# Patient Record
Sex: Male | Born: 1940 | Race: White | Hispanic: No | Smoking: Former smoker
Health system: Southern US, Community
[De-identification: ages and names within clinical notes are randomized; demographics above are authoritative.]

## PROBLEM LIST (undated history)

## (undated) ENCOUNTER — Ambulatory Visit: Source: Home / Self Care

## (undated) DIAGNOSIS — D126 Benign neoplasm of colon, unspecified: Secondary | ICD-10-CM

## (undated) DIAGNOSIS — K5790 Diverticulosis of intestine, part unspecified, without perforation or abscess without bleeding: Secondary | ICD-10-CM

## (undated) DIAGNOSIS — K4090 Unilateral inguinal hernia, without obstruction or gangrene, not specified as recurrent: Secondary | ICD-10-CM

## (undated) DIAGNOSIS — E785 Hyperlipidemia, unspecified: Secondary | ICD-10-CM

## (undated) DIAGNOSIS — J61 Pneumoconiosis due to asbestos and other mineral fibers: Secondary | ICD-10-CM

## (undated) DIAGNOSIS — I441 Atrioventricular block, second degree: Secondary | ICD-10-CM

## (undated) DIAGNOSIS — L602 Onychogryphosis: Secondary | ICD-10-CM

## (undated) DIAGNOSIS — C4491 Basal cell carcinoma of skin, unspecified: Secondary | ICD-10-CM

## (undated) DIAGNOSIS — H409 Unspecified glaucoma: Secondary | ICD-10-CM

## (undated) DIAGNOSIS — I491 Atrial premature depolarization: Secondary | ICD-10-CM

## (undated) DIAGNOSIS — I10 Essential (primary) hypertension: Secondary | ICD-10-CM

## (undated) DIAGNOSIS — H269 Unspecified cataract: Secondary | ICD-10-CM

## (undated) DIAGNOSIS — K648 Other hemorrhoids: Secondary | ICD-10-CM

## (undated) HISTORY — DX: Atrial premature depolarization: I49.1

## (undated) HISTORY — DX: Unspecified glaucoma: H40.9

## (undated) HISTORY — DX: Pneumoconiosis due to asbestos and other mineral fibers: J61

## (undated) HISTORY — DX: Unilateral inguinal hernia, without obstruction or gangrene, not specified as recurrent: K40.90

## (undated) HISTORY — DX: Essential (primary) hypertension: I10

## (undated) HISTORY — PX: PILONIDAL CYST EXCISION: SHX744

## (undated) HISTORY — DX: Basal cell carcinoma of skin, unspecified: C44.91

## (undated) HISTORY — DX: Other hemorrhoids: K64.8

## (undated) HISTORY — DX: Diverticulosis of intestine, part unspecified, without perforation or abscess without bleeding: K57.90

## (undated) HISTORY — DX: Hyperlipidemia, unspecified: E78.5

## (undated) HISTORY — PX: REFRACTIVE SURGERY: SHX103

## (undated) HISTORY — DX: Unspecified cataract: H26.9

## (undated) HISTORY — DX: Onychogryphosis: L60.2

## (undated) HISTORY — DX: Benign neoplasm of colon, unspecified: D12.6

## (undated) HISTORY — PX: SHOULDER SURGERY: SHX246

## (undated) HISTORY — PX: TONSILLECTOMY: SUR1361

## (undated) HISTORY — PX: EYE SURGERY: SHX253

---

## 1999-05-21 ENCOUNTER — Encounter: Admission: RE | Admit: 1999-05-21 | Discharge: 1999-08-19 | Payer: Self-pay | Admitting: Family Medicine

## 2000-05-24 LAB — HM COLONOSCOPY: HM Colonoscopy: NORMAL

## 2000-12-01 ENCOUNTER — Ambulatory Visit (HOSPITAL_COMMUNITY): Admission: RE | Admit: 2000-12-01 | Discharge: 2000-12-01 | Payer: Self-pay | Admitting: *Deleted

## 2000-12-01 ENCOUNTER — Encounter: Payer: Self-pay | Admitting: Internal Medicine

## 2003-07-30 ENCOUNTER — Ambulatory Visit (HOSPITAL_COMMUNITY): Admission: RE | Admit: 2003-07-30 | Discharge: 2003-07-30 | Payer: Self-pay | Admitting: Orthopaedic Surgery

## 2003-07-30 ENCOUNTER — Ambulatory Visit (HOSPITAL_BASED_OUTPATIENT_CLINIC_OR_DEPARTMENT_OTHER): Admission: RE | Admit: 2003-07-30 | Discharge: 2003-07-30 | Payer: Self-pay | Admitting: Orthopaedic Surgery

## 2005-01-26 ENCOUNTER — Emergency Department (HOSPITAL_COMMUNITY): Admission: EM | Admit: 2005-01-26 | Discharge: 2005-01-27 | Payer: Self-pay | Admitting: Emergency Medicine

## 2005-08-22 ENCOUNTER — Emergency Department (HOSPITAL_COMMUNITY): Admission: EM | Admit: 2005-08-22 | Discharge: 2005-08-22 | Payer: Self-pay | Admitting: Emergency Medicine

## 2005-09-07 ENCOUNTER — Encounter: Admission: RE | Admit: 2005-09-07 | Discharge: 2005-09-07 | Payer: Self-pay | Admitting: Internal Medicine

## 2005-12-20 ENCOUNTER — Ambulatory Visit: Payer: Self-pay | Admitting: Family Medicine

## 2006-03-28 ENCOUNTER — Ambulatory Visit: Payer: Self-pay | Admitting: Family Medicine

## 2006-03-28 LAB — CONVERTED CEMR LAB
HDL: 28.8 mg/dL — ABNORMAL LOW (ref >39.0)
Hgb A1c MFr Bld: 6.1 % — ABNORMAL HIGH (ref 4.6–6.0)
LDL Cholesterol: 104 mg/dL — ABNORMAL HIGH (ref 0–99)
VLDL: 26 mg/dL (ref 0–40)

## 2006-05-31 ENCOUNTER — Ambulatory Visit: Payer: Self-pay | Admitting: Family Medicine

## 2006-05-31 LAB — CONVERTED CEMR LAB
ALT: 46 units/L — ABNORMAL HIGH (ref 0–40)
AST: 37 units/L (ref 0–37)
Chol/HDL Ratio, serum: 3.8
HDL: 35.6 mg/dL — ABNORMAL LOW (ref >39.0)
LDL Cholesterol: 85 mg/dL (ref 0–99)
Triglyceride fasting, serum: 74 mg/dL (ref 0–149)
VLDL: 15 mg/dL (ref 0–40)

## 2006-06-28 ENCOUNTER — Ambulatory Visit: Payer: Self-pay | Admitting: Family Medicine

## 2006-06-28 LAB — CONVERTED CEMR LAB
AST: 35 units/L (ref 0–37)
BUN: 12 mg/dL (ref 6–23)
CO2: 29 meq/L (ref 19–32)
Calcium: 9.5 mg/dL (ref 8.4–10.5)
Chloride: 104 meq/L (ref 96–112)
Cholesterol: 141 mg/dL (ref 0–200)
Creatinine, Ser: 1 mg/dL (ref 0.4–1.5)
GFR calc Af Amer: 96 mL/min
Glucose, Bld: 137 mg/dL — ABNORMAL HIGH (ref 70–99)
Hgb A1c MFr Bld: 6.5 % — ABNORMAL HIGH (ref 4.6–6.0)
Sodium: 137 meq/L (ref 135–145)

## 2006-07-19 ENCOUNTER — Ambulatory Visit: Payer: Self-pay | Admitting: Family Medicine

## 2006-07-19 LAB — CONVERTED CEMR LAB
CO2: 30 meq/L (ref 19–32)
Calcium: 9.2 mg/dL (ref 8.4–10.5)
Creatinine, Ser: 0.9 mg/dL (ref 0.4–1.5)
GFR calc Af Amer: 108 mL/min
Glucose, Bld: 119 mg/dL — ABNORMAL HIGH (ref 70–99)
Potassium: 4.6 meq/L (ref 3.5–5.1)
Sodium: 139 meq/L (ref 135–145)

## 2006-09-23 DIAGNOSIS — E785 Hyperlipidemia, unspecified: Secondary | ICD-10-CM | POA: Insufficient documentation

## 2006-09-23 DIAGNOSIS — I10 Essential (primary) hypertension: Secondary | ICD-10-CM

## 2006-09-23 DIAGNOSIS — E1140 Type 2 diabetes mellitus with diabetic neuropathy, unspecified: Secondary | ICD-10-CM

## 2006-09-23 DIAGNOSIS — C4490 Unspecified malignant neoplasm of skin, unspecified: Secondary | ICD-10-CM

## 2006-09-27 ENCOUNTER — Encounter: Payer: Self-pay | Admitting: Family Medicine

## 2006-09-27 ENCOUNTER — Ambulatory Visit: Payer: Self-pay | Admitting: Family Medicine

## 2006-09-27 ENCOUNTER — Encounter (INDEPENDENT_AMBULATORY_CARE_PROVIDER_SITE_OTHER): Payer: Self-pay | Admitting: Family Medicine

## 2006-09-28 ENCOUNTER — Telehealth (INDEPENDENT_AMBULATORY_CARE_PROVIDER_SITE_OTHER): Payer: Self-pay | Admitting: Family Medicine

## 2006-09-28 LAB — CONVERTED CEMR LAB
Chloride: 103 meq/L (ref 96–112)
Creatinine, Ser: 0.9 mg/dL (ref 0.4–1.5)
GFR calc non Af Amer: 89 mL/min
Hgb A1c MFr Bld: 6.1 % — ABNORMAL HIGH (ref 4.6–6.0)
Potassium: 4.1 meq/L (ref 3.5–5.1)

## 2007-01-03 ENCOUNTER — Telehealth (INDEPENDENT_AMBULATORY_CARE_PROVIDER_SITE_OTHER): Payer: Self-pay | Admitting: *Deleted

## 2007-01-31 ENCOUNTER — Telehealth (INDEPENDENT_AMBULATORY_CARE_PROVIDER_SITE_OTHER): Payer: Self-pay | Admitting: *Deleted

## 2007-02-06 ENCOUNTER — Telehealth (INDEPENDENT_AMBULATORY_CARE_PROVIDER_SITE_OTHER): Payer: Self-pay | Admitting: *Deleted

## 2007-03-01 ENCOUNTER — Ambulatory Visit: Payer: Self-pay | Admitting: Family Medicine

## 2007-03-02 ENCOUNTER — Encounter (INDEPENDENT_AMBULATORY_CARE_PROVIDER_SITE_OTHER): Payer: Self-pay | Admitting: *Deleted

## 2007-03-02 ENCOUNTER — Telehealth (INDEPENDENT_AMBULATORY_CARE_PROVIDER_SITE_OTHER): Payer: Self-pay | Admitting: *Deleted

## 2007-03-02 LAB — CONVERTED CEMR LAB
AST: 27 units/L (ref 0–37)
Chloride: 104 meq/L (ref 96–112)
Cholesterol: 142 mg/dL (ref 0–200)
Creatinine,U: 50.8 mg/dL
GFR calc Af Amer: 108 mL/min
Glucose, Bld: 112 mg/dL — ABNORMAL HIGH (ref 70–99)
HDL: 36.1 mg/dL — ABNORMAL LOW (ref >39.0)
Microalb, Ur: 0.2 mg/dL (ref 0.0–1.9)
Potassium: 4.4 meq/L (ref 3.5–5.1)
Sodium: 140 meq/L (ref 135–145)
Total CHOL/HDL Ratio: 3.9
VLDL: 23 mg/dL (ref 0–40)

## 2007-03-07 ENCOUNTER — Encounter (INDEPENDENT_AMBULATORY_CARE_PROVIDER_SITE_OTHER): Payer: Self-pay | Admitting: Family Medicine

## 2007-04-05 ENCOUNTER — Ambulatory Visit: Payer: Self-pay | Admitting: Family Medicine

## 2007-06-27 ENCOUNTER — Ambulatory Visit: Payer: Self-pay | Admitting: Family Medicine

## 2007-06-28 ENCOUNTER — Telehealth (INDEPENDENT_AMBULATORY_CARE_PROVIDER_SITE_OTHER): Payer: Self-pay | Admitting: *Deleted

## 2007-07-03 ENCOUNTER — Encounter (INDEPENDENT_AMBULATORY_CARE_PROVIDER_SITE_OTHER): Payer: Self-pay | Admitting: *Deleted

## 2007-07-03 LAB — CONVERTED CEMR LAB
AST: 25 units/L (ref 0–37)
Hgb A1c MFr Bld: 6.3 % — ABNORMAL HIGH (ref 4.6–6.0)

## 2007-09-28 ENCOUNTER — Ambulatory Visit: Payer: Self-pay | Admitting: Internal Medicine

## 2007-10-03 LAB — CONVERTED CEMR LAB
BUN: 14 mg/dL (ref 6–23)
Calcium: 9.4 mg/dL (ref 8.4–10.5)
Creatinine, Ser: 0.9 mg/dL (ref 0.4–1.5)
Eosinophils Relative: 4.7 % (ref 0.0–5.0)
GFR calc Af Amer: 108 mL/min
GFR calc non Af Amer: 89 mL/min
Hemoglobin: 14.3 g/dL (ref 13.0–17.0)
Monocytes Absolute: 0.5 10*3/uL (ref 0.1–1.0)
Monocytes Relative: 9 % (ref 3.0–12.0)
Neutro Abs: 3.5 10*3/uL (ref 1.4–7.7)
Platelets: 259 10*3/uL (ref 150–400)
WBC: 5.9 10*3/uL (ref 4.5–10.5)

## 2008-02-13 ENCOUNTER — Ambulatory Visit: Payer: Self-pay | Admitting: Internal Medicine

## 2008-02-19 LAB — CONVERTED CEMR LAB
ALT: 24 units/L (ref 0–53)
AST: 24 units/L (ref 0–37)

## 2008-02-20 ENCOUNTER — Telehealth (INDEPENDENT_AMBULATORY_CARE_PROVIDER_SITE_OTHER): Payer: Self-pay | Admitting: *Deleted

## 2008-02-29 ENCOUNTER — Telehealth (INDEPENDENT_AMBULATORY_CARE_PROVIDER_SITE_OTHER): Payer: Self-pay | Admitting: *Deleted

## 2008-03-14 ENCOUNTER — Encounter: Payer: Self-pay | Admitting: Internal Medicine

## 2008-03-21 ENCOUNTER — Encounter: Payer: Self-pay | Admitting: Internal Medicine

## 2008-05-25 ENCOUNTER — Encounter: Payer: Self-pay | Admitting: Internal Medicine

## 2008-05-29 ENCOUNTER — Encounter: Payer: Self-pay | Admitting: Internal Medicine

## 2008-06-03 ENCOUNTER — Telehealth: Payer: Self-pay | Admitting: Internal Medicine

## 2008-06-10 ENCOUNTER — Encounter: Admission: RE | Admit: 2008-06-10 | Discharge: 2008-06-10 | Payer: Self-pay | Admitting: Internal Medicine

## 2008-06-11 ENCOUNTER — Ambulatory Visit: Payer: Self-pay | Admitting: Internal Medicine

## 2008-06-11 ENCOUNTER — Encounter (INDEPENDENT_AMBULATORY_CARE_PROVIDER_SITE_OTHER): Payer: Self-pay | Admitting: *Deleted

## 2008-06-12 ENCOUNTER — Telehealth (INDEPENDENT_AMBULATORY_CARE_PROVIDER_SITE_OTHER): Payer: Self-pay | Admitting: *Deleted

## 2008-06-13 ENCOUNTER — Encounter (INDEPENDENT_AMBULATORY_CARE_PROVIDER_SITE_OTHER): Payer: Self-pay | Admitting: *Deleted

## 2008-06-18 ENCOUNTER — Ambulatory Visit: Payer: Self-pay | Admitting: Internal Medicine

## 2008-06-24 ENCOUNTER — Encounter (INDEPENDENT_AMBULATORY_CARE_PROVIDER_SITE_OTHER): Payer: Self-pay | Admitting: *Deleted

## 2008-07-26 ENCOUNTER — Telehealth (INDEPENDENT_AMBULATORY_CARE_PROVIDER_SITE_OTHER): Payer: Self-pay | Admitting: *Deleted

## 2008-10-10 ENCOUNTER — Encounter: Payer: Self-pay | Admitting: Internal Medicine

## 2008-10-15 ENCOUNTER — Ambulatory Visit: Payer: Self-pay | Admitting: Internal Medicine

## 2008-10-24 LAB — CONVERTED CEMR LAB
AST: 37 units/L (ref 0–37)
Basophils Relative: 0.4 % (ref 0.0–3.0)
Eosinophils Absolute: 0.5 10*3/uL (ref 0.0–0.7)
Eosinophils Relative: 7 % — ABNORMAL HIGH (ref 0.0–5.0)
HCT: 39.6 % (ref 39.0–52.0)
MCV: 93.8 fL (ref 78.0–100.0)
Monocytes Relative: 7.6 % (ref 3.0–12.0)
WBC: 6.5 10*3/uL (ref 4.5–10.5)

## 2009-01-01 ENCOUNTER — Telehealth (INDEPENDENT_AMBULATORY_CARE_PROVIDER_SITE_OTHER): Payer: Self-pay | Admitting: *Deleted

## 2009-02-18 ENCOUNTER — Ambulatory Visit: Payer: Self-pay | Admitting: Internal Medicine

## 2009-02-20 ENCOUNTER — Telehealth (INDEPENDENT_AMBULATORY_CARE_PROVIDER_SITE_OTHER): Payer: Self-pay | Admitting: *Deleted

## 2009-02-21 ENCOUNTER — Encounter (INDEPENDENT_AMBULATORY_CARE_PROVIDER_SITE_OTHER): Payer: Self-pay | Admitting: *Deleted

## 2009-02-21 LAB — CONVERTED CEMR LAB
BUN: 12 mg/dL (ref 6–23)
CO2: 28 meq/L (ref 19–32)
Calcium: 9.1 mg/dL (ref 8.4–10.5)
Creatinine,U: 129.6 mg/dL
Glucose, Bld: 126 mg/dL — ABNORMAL HIGH (ref 70–99)
Hgb A1c MFr Bld: 6.6 % — ABNORMAL HIGH (ref 4.6–6.5)
LDL Cholesterol: 80 mg/dL (ref 0–99)
Microalb Creat Ratio: 2.3 mg/g (ref 0.0–30.0)
Triglycerides: 118 mg/dL (ref 0.0–149.0)
VLDL: 23.6 mg/dL (ref 0.0–40.0)

## 2009-04-22 ENCOUNTER — Telehealth (INDEPENDENT_AMBULATORY_CARE_PROVIDER_SITE_OTHER): Payer: Self-pay | Admitting: *Deleted

## 2009-05-09 ENCOUNTER — Telehealth (INDEPENDENT_AMBULATORY_CARE_PROVIDER_SITE_OTHER): Payer: Self-pay | Admitting: *Deleted

## 2009-05-26 ENCOUNTER — Telehealth (INDEPENDENT_AMBULATORY_CARE_PROVIDER_SITE_OTHER): Payer: Self-pay | Admitting: *Deleted

## 2009-06-25 ENCOUNTER — Ambulatory Visit: Payer: Self-pay | Admitting: Internal Medicine

## 2009-06-30 LAB — CONVERTED CEMR LAB: Hgb A1c MFr Bld: 7.3 % — ABNORMAL HIGH (ref 4.6–6.5)

## 2009-07-08 ENCOUNTER — Encounter (INDEPENDENT_AMBULATORY_CARE_PROVIDER_SITE_OTHER): Payer: Self-pay | Admitting: *Deleted

## 2009-07-10 ENCOUNTER — Telehealth: Payer: Self-pay | Admitting: Internal Medicine

## 2009-07-10 ENCOUNTER — Ambulatory Visit: Payer: Self-pay | Admitting: Diagnostic Radiology

## 2009-07-10 ENCOUNTER — Encounter: Payer: Self-pay | Admitting: Internal Medicine

## 2009-07-10 ENCOUNTER — Emergency Department (HOSPITAL_BASED_OUTPATIENT_CLINIC_OR_DEPARTMENT_OTHER): Admission: EM | Admit: 2009-07-10 | Discharge: 2009-07-10 | Payer: Self-pay | Admitting: Emergency Medicine

## 2009-07-10 LAB — CONVERTED CEMR LAB
Creatinine, Ser: 0.9 mg/dL
Glucose, Bld: 155 mg/dL
Hemoglobin: 14.1 g/dL
Potassium, serum: 4.3 mmol/L
RBC count: 4.32 10*6/uL
WBC, blood: 4.3 10*3/uL

## 2009-09-08 ENCOUNTER — Encounter (INDEPENDENT_AMBULATORY_CARE_PROVIDER_SITE_OTHER): Payer: Self-pay | Admitting: *Deleted

## 2009-09-16 ENCOUNTER — Ambulatory Visit: Payer: Self-pay | Admitting: Internal Medicine

## 2009-09-16 DIAGNOSIS — R7989 Other specified abnormal findings of blood chemistry: Secondary | ICD-10-CM | POA: Insufficient documentation

## 2009-09-17 LAB — CONVERTED CEMR LAB
ALT: 38 units/L (ref 0–53)
Albumin: 4 g/dL (ref 3.5–5.2)
TSH: 1.15 microintl units/mL (ref 0.35–5.50)
Total Bilirubin: 1 mg/dL (ref 0.3–1.2)

## 2009-12-09 ENCOUNTER — Telehealth (INDEPENDENT_AMBULATORY_CARE_PROVIDER_SITE_OTHER): Payer: Self-pay | Admitting: *Deleted

## 2010-01-20 ENCOUNTER — Ambulatory Visit: Payer: Self-pay | Admitting: Internal Medicine

## 2010-01-23 LAB — CONVERTED CEMR LAB
Calcium: 9.3 mg/dL (ref 8.4–10.5)
Creatinine, Ser: 0.9 mg/dL (ref 0.4–1.5)
HDL: 31.1 mg/dL — ABNORMAL LOW (ref >39.00)
LDL Cholesterol: 80 mg/dL (ref 0–99)
Total CHOL/HDL Ratio: 4
VLDL: 27.6 mg/dL (ref 0.0–40.0)

## 2010-05-19 ENCOUNTER — Ambulatory Visit: Payer: Self-pay | Admitting: Internal Medicine

## 2010-05-21 LAB — CONVERTED CEMR LAB
ALT: 29 units/L (ref 0–53)
AST: 26 units/L (ref 0–37)

## 2010-06-21 LAB — CONVERTED CEMR LAB
CO2: 27 meq/L (ref 19–32)
Calcium: 9.3 mg/dL (ref 8.4–10.5)
Creatinine, Ser: 0.9 mg/dL (ref 0.4–1.5)
GFR calc non Af Amer: 89 mL/min
HDL: 30.8 mg/dL — ABNORMAL LOW (ref >39.0)
Hgb A1c MFr Bld: 6.5 % — ABNORMAL HIGH (ref 4.6–6.0)
LDL Cholesterol: 60 mg/dL (ref 0–99)
PSA: 1.79 ng/mL (ref 0.10–4.00)
Total CHOL/HDL Ratio: 4.1
Triglycerides: 182 mg/dL — ABNORMAL HIGH (ref 0–149)
VLDL: 36 mg/dL (ref 0–40)

## 2010-06-23 NOTE — Assessment & Plan Note (Signed)
Summary: 10 week fu/kdc   Vital Signs:  Patient profile:   70 year old male Height:      73 inches Weight:      221 pounds BMI:     29.26 Pulse rate:   64 / minute BP sitting:   142 / 80  Vitals Entered By: Shary Decamp (September 16, 2009 8:56 AM) CC: rov, fasting   History of Present Illness: palpitations, went to the ER 07-04-09 no symptoms since then records reviewed CT of the chest negative CBC normal with a hemoglobin of 14.1 BMP normal EKG showed normal sinus rhythm, no change compared to the EKGs in the office  HTN-- ambulatory BPs in the 130/70s  DM-- ambulatory CBGs fasting in the 130s   increased LFTs per last labs--  does not take Tylenol, patient takes Aleve prn .  Does not take any herbs or over-the-counter supplements.  Drinks two glasses of wine at night  Current Medications (verified): 1)  Cardizem Cd 180 Mg Xr24h-Cap (Diltiazem Hcl Coated Beads) .... Take 1 Daily 2)  Lipitor 20 Mg  Tabs (Atorvastatin Calcium) .... Take One Tablet Daily 3)  Metformin Hcl 850 Mg  Tabs (Metformin Hcl) .... Take One Tablet Twice Daily 4)  Bayer Low Strength 81 Mg Tbec (Aspirin) 5)  Fish Oil Concentrate 120-180 Mg  Caps (Omega-3 Fatty Acids) 6)  Metoprolol Succinate 100 Mg  Tb24 (Metoprolol Succinate) .... Take One Tablet Daily 7)  Freestyle Lite Test   Strp (Glucose Blood) .... Check Blood Sugar 1x/day Dx 250.00 8)  Freestyle Lite Lancets .... Check Cbgs Once A Day Dx Dm 9)  Mvi  Allergies (verified): No Known Drug Allergies  Past History:  Past Medical History: Reviewed history from 06/25/2009 and no changes required. Diabetes mellitus, type II Hyperlipidemia Hypertension CARCINOMA, BASAL CELL   PREMATURE VENTRICULAR CONTRACTIONS  Screening 07-2008: U/S (-) for AAA, ABIs normal, Carotid u/s R normal, L mild Dz (will order a carotid u/s 07-2009)  Past Surgical History: Reviewed history from 03/01/2007 and no changes required. right shoulder surgery. pilonidal  cyst Tonsillectomy  Family History: Reviewed history from 09/28/2007 and no changes required. CAD - no HTN - M DM - no stroke - no colon Ca - no prostate Ca - no Dementia - F thyroid Ca - son Parkinson - sister glaucoma - M  Social History: Retired Cabin crew Academic librarian) Married 2 children, son lives in [place] Never Smoked Alcohol use-yes: 2 glasses of wine daily Drug use-no Regular exercise-yes: plays golf 3 times per week diet-- eats healthy   Review of Systems CV:  Denies chest pain or discomfort and swelling of feet. Resp:  Denies cough and shortness of breath. GI:  Denies bloody stools, nausea, and vomiting. GU:  Denies hematuria, urinary frequency, and urinary hesitancy.  Physical Exam  General:  alert, well-developed, and well-nourished.   Neck:  no masses, no thyromegaly, and normal carotid upstroke.   Lungs:  normal respiratory effort, no intercostal retractions, no accessory muscle use, and normal breath sounds.   Heart:  normal rate, regular rhythm, no murmur, and no gallop.   Abdomen:  soft, non-tender, no distention, and no masses.   Rectal:  No external abnormalities noted. Normal sphincter tone. No rectal masses or tenderness. Hemoccult negative Prostate:  Prostate gland firm and smooth, no enlargement, nodularity, tenderness, mass, asymmetry or induration.   Impression & Recommendations:  Problem # 1:  OTHER ABNORMAL BLOOD CHEMISTRY (ICD-790.6) recently increased LFTs, labs he's taking no new medications  Labs  Orders: Venipuncture ([address]) TLB-Hepatic/Liver Function Pnl (80076-HEPATIC)  Problem # 2:  PREVENTIVE HEALTH CARE (ICD-V70.0) Td 2003 Pneumonia shot 5-09 check a PSA  had shingles shot 2009 aprox Cscope-- saw Dr Luther Parody in 2002 , next 2012.  Hemoccult negative today   Problem # 3:  PREMATURE VENTRICULAR CONTRACTIONS (ICD-427.69) long history of palpitations diagnosing the 80s, initially prescribed beta-blockers for it.   Later on he saw  Dr. Mayford Knife, cardiology, they did a Holter monitor, the patient reports that Dr. Mayford Knife added Cardizem went to the ER a couple of months ago with palpitations, see HPI, workup negative His updated medication list for this problem includes:    Metoprolol Succinate 100 Mg Tb24 (Metoprolol succinate) .Marland Kitchen... Take one tablet daily    Bayer Low Strength 81 Mg Tbec (Aspirin)  Problem # 4:  HYPERTENSION (ICD-401.9) no change His updated medication list for this problem includes:    Metoprolol Succinate 100 Mg Tb24 (Metoprolol succinate) .Marland Kitchen... Take one tablet daily    Cardizem Cd 180 Mg Xr24h-cap (Diltiazem hcl coated beads) .Marland Kitchen... Take 1 daily    BP today: 142/80 Prior BP: 122/80 (06/25/2009)  Labs Reviewed: K+: 4.9 (02/18/2009) Creat: : 0.9 (02/18/2009)   Chol: 133 (02/18/2009)   HDL: 29.70 (02/18/2009)   LDL: 80 (02/18/2009)   TG: 118.0 (02/18/2009)  Orders: TLB-TSH (Thyroid Stimulating Hormone) (84443-TSH)  Problem # 5:  HYPERLIPIDEMIA (ICD-272.4) at goal  His updated medication list for this problem includes:    Lipitor 20 Mg Tabs (Atorvastatin calcium) .Marland Kitchen... Take one tablet daily  Labs Reviewed: SGOT: 44 (06/25/2009)   SGPT: 55 (06/25/2009)   HDL:29.70 (02/18/2009), 30.8 (06/11/2008)  LDL:80 (02/18/2009), 60 (06/11/2008)  Chol:133 (02/18/2009), 127 (06/11/2008)  Trig:118.0 (02/18/2009), 182 (06/11/2008)  Problem # 6:  DIABETES MELLITUS, TYPE II (ICD-250.00) due for a hemoglobin A1C encouraged diet and exercise he does have his eyes checked every 6 most due to a family history glaucoma.  The patient does not have glaucoma himself His updated medication list for this problem includes:    Metformin Hcl 850 Mg Tabs (Metformin hcl) .Marland Kitchen... Take one tablet twice daily    Bayer Low Strength 81 Mg Tbec (Aspirin)  Labs Reviewed: Creat: 0.9 (02/18/2009)     Last Eye Exam: normal (08/22/2008) Reviewed HgBA1c results: 7.3 (06/25/2009)  6.6 (02/18/2009)  Complete Medication List: 1)   Metoprolol Succinate 100 Mg Tb24 (Metoprolol succinate) .... Take one tablet daily 2)  Cardizem Cd 180 Mg Xr24h-cap (Diltiazem hcl coated beads) .... Take 1 daily 3)  Lipitor 20 Mg Tabs (Atorvastatin calcium) .... Take one tablet daily 4)  Metformin Hcl 850 Mg Tabs (Metformin hcl) .... Take one tablet twice daily 5)  Bayer Low Strength 81 Mg Tbec (Aspirin) 6)  Fish Oil Concentrate 120-180 Mg Caps (Omega-3 fatty acids) 7)  Freestyle Lite Test Strp (Glucose blood) .... Check blood sugar 1x/day dx 250.00 8)  Freestyle Lite Lancets  .... Check cbgs once a day dx dm 9)  Mvi   Other Orders: TLB-PSA (Prostate Specific Antigen) (84153-PSA)  Patient Instructions: 1)  Please schedule a follow-up appointment in 4months  (fasting)

## 2010-06-23 NOTE — Progress Notes (Signed)
Summary: refill  Phone Note Refill Request Message from:  Patient on December 09, 2009 1:21 PM  Refills Requested: Medication #1:  METOPROLOL SUCCINATE 100 MG  TB24 Take one tablet daily  Medication #2:  CARDIZEM CD 180 MG XR24H-CAP TAKE 1 DAILY  Medication #3:  METFORMIN HCL 850 MG  TABS Take one tablet twice daily [facility]  Initial call taken by: Okey Regal [place],  December 09, 2009 1:24 PM    Prescriptions: METFORMIN HCL 850 MG  TABS (METFORMIN HCL) Take one tablet twice daily  #180 x 0   Entered by:   Army Fossa CMA   Authorized by:   Nolon Rod. Paz MD   Signed by:   Army Fossa CMA on 12/09/2009   Method used:   Faxed to ...       [facility])       [address] [address]       [place], [place]  [address]       Ph: [phone]       Fax: [phone]   RxID:   [id number] CARDIZEM CD 180 MG XR24H-CAP (DILTIAZEM HCL COATED BEADS) TAKE 1 DAILY  #90 x 0   Entered by:   Army Fossa CMA   Authorized by:   Nolon Rod. Paz MD   Signed by:   Army Fossa CMA on 12/09/2009   Method used:   Faxed to ...       [facility] education [facility])       [address] [address]       [place], [place]  [address]       Ph: [phone]       Fax: [phone]   RxID:   [id number] METOPROLOL SUCCINATE 100 MG  TB24 (METOPROLOL SUCCINATE) Take one tablet daily  #90 x 0   Entered by:   Army Fossa CMA   Authorized by:   Nolon Rod. Paz MD   Signed by:   Army Fossa CMA on 12/09/2009   Method used:   Faxed to ...       [facility] Environmental education [facility])       [address] [address]       [place], [place]  [address]       Ph: [phone]       Fax: [phone]   RxID:   [id number]

## 2010-06-23 NOTE — Miscellaneous (Signed)
Summary: labs from ED  Clinical Lists Changes  Observations: Added new observation of BG RANDOM: 155 mg/dL (04/54/0981 19:14) Added new observation of CREATININE: 0.9 mg/dL (78/29/5621 30:86) Added new observation of BUN: 14 mg/dL (57/84/6962 95:28) Added new observation of CO2 TOTAL: 29 mmol/L (07/10/2009 10:42) Added new observation of CHLORIDE: 100 mmol/L (07/10/2009 10:42) Added new observation of POTASSIUM: 4.3 mmol/L (07/10/2009 10:42) Added new observation of SODIUM: 137 mmol/L (07/10/2009 10:42) Added new observation of PLATELET CNT: 225 10*3/microliter (07/10/2009 10:42) Added new observation of HCT: 40.5 % (07/10/2009 10:42) Added new observation of HGB: 14.1 g/dL (41/32/4401 02:72) Added new observation of RBC: 4.32 10*6/mm3 (07/10/2009 10:42) Added new observation of WBC: 4.3 10*3/mm3 (07/10/2009 10:42)      Complete Blood Count Test Date: 07/10/2009             Value   Units      H/L    Reference  WBC:       4.3   X 10^3/uL          (3.5-10.0) RBC:       4.32  X 10^6/uL          (4.20-5.80) Hgb:       14.1  g/dl               (53.6-64.4) Hct:       40.5  %                  (38.5-52.0) Platelets: 225   X 10^3/uL          (150-450)    Chemistry Labs Test Date: 07/10/2009                      Value Units        H/L   Reference  Sodium:             137   mmol/L             (137-145) Potassium:          4.3   mmol/L             (3.6-5.0) Chloride:           100   mmol/L        L    (101-111) CO2:                29    mmol/L             (22-31) BUN:                14    mg/dL              (0-34) Creatinine:         0.9   mg/dL              (7.4-2.5) Glucose-random:     155   mg/dL         H    (95-638)

## 2010-06-23 NOTE — Letter (Signed)
Summary: Previsit letter  [facility]  [address] [place], [place] [address]   Phone: [phone]  Fax: [phone]       09/08/2009 MRN: [id number]  Chase Martinez [address] [place], [place]  [address]  Dear [name],  Welcome to the Gastroenterology Division at [facility].    You are scheduled to see a nurse for your pre-procedure visit on  Sep 26, 2009 at 11am on the 3rd floor at [facility], [address]  We ask that you try to arrive at our office 15 minutes prior to your appointment time to allow for check-in.  Your nurse visit will consist of discussing your medical and surgical history, your immediate family medical history, and your medications.    Please bring a complete list of all your medications or, if you prefer, bring the medication bottles and we will list them.  We will need to be aware of both prescribed and over the counter drugs.  We will need to know exact dosage information as well.  If you are on blood thinners (Coumadin, Plavix, Aggrenox, Ticlid, etc.) please call our office today/prior to your appointment, as we need to consult with your physician about holding your medication.   Please be prepared to read and sign documents such as consent forms, a financial agreement, and acknowledgement forms.  If necessary, and with your consent, a friend or relative is welcome to sit-in on the nurse visit with you.  Please bring your insurance card so that we may make a copy of it.  If your insurance requires a referral to see a specialist, please bring your referral form from your primary care physician.  No co-pay is required for this nurse visit.     If you cannot keep your appointment, please call [phone] to cancel or reschedule prior to your appointment date.  This allows [place] the opportunity to schedule an appointment for another patient in need of care.    Thank you for choosing [facility] for your medical  needs.  We appreciate the opportunity to care for you.  Please visit [place] at our website  to learn more about our practice.                     Sincerely.                                                                                                                   The Gastroenterology Division

## 2010-06-23 NOTE — Assessment & Plan Note (Signed)
Summary: rot 4 months.cbs   Vital Signs:  Patient profile:   70 year old male Weight:      224.13 pounds Pulse rate:   70 / minute Pulse rhythm:   regular BP sitting:   126 / 82  (left arm) Cuff size:   large  Vitals Entered By: Army Fossa CMA (January 20, 2010 8:00 AM) CC: RTO 4 month f/u: fasting   History of Present Illness: Diabetes-- ambulatory CBGs 127 average before breakfast Hyperlipidemia-- good medication compliance , no myalgias  Hypertension- ambulatory BPs 130s/80    Current Medications (verified): 1)  Metoprolol Succinate 100 Mg  Tb24 (Metoprolol Succinate) .... Take One Tablet Daily 2)  Cardizem Cd 180 Mg Xr24h-Cap (Diltiazem Hcl Coated Beads) .... Take 1 Daily 3)  Lipitor 20 Mg  Tabs (Atorvastatin Calcium) .... Take One Tablet Daily 4)  Metformin Hcl 850 Mg  Tabs (Metformin Hcl) .... Take One Tablet Twice Daily 5)  Bayer Low Strength 81 Mg Tbec (Aspirin) 6)  Fish Oil Concentrate 120-180 Mg  Caps (Omega-3 Fatty Acids) 7)  Freestyle Lite Test   Strp (Glucose Blood) .... Check Blood Sugar 1x/day Dx 250.00 8)  Freestyle Lite Lancets .... Check Cbgs Once A Day Dx Dm 9)  Mvi  Allergies (verified): No Known Drug Allergies  Past History:  Past Medical History: Diabetes mellitus, type II Hyperlipidemia Hypertension CARCINOMA, BASAL CELL   PREMATURE VENTRICULAR CONTRACTIONS  Screening 07-2008: U/S (-) for AAA, ABIs normal, Carotid u/s R normal, L mild Dz (will order a carotid u/s 07-2009)  Past Surgical History: Reviewed history from 03/01/2007 and no changes required. right shoulder surgery. pilonidal cyst Tonsillectomy  Social History: Reviewed history from 09/16/2009 and no changes required. 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 CV:  Denies chest pain or discomfort, palpitations, and swelling of feet. GI:  Denies diarrhea, nausea, and vomiting. Endo:  Denies weight change; no low sugar symptoms .  Physical Exam  General:  alert, well-developed, and well-nourished.   Lungs:  normal respiratory effort, no intercostal retractions, no accessory muscle use, and normal breath sounds.   Heart:  normal rate, regular rhythm, no murmur, and no gallop.   Pulses:  normal pedal pulses bilaterally  Extremities:  no pretibial edema bilaterally   Diabetes Management Exam:    Foot Exam (with socks and/or shoes not present):       Sensory-Pinprick/Light touch:          Left medial foot (L-4): normal          Left dorsal foot (L-5): normal          Left lateral foot (S-1): normal          Right medial foot (L-4): normal          Right dorsal foot (L-5): normal          Right lateral foot (S-1): normal       Sensory-Monofilament:          Left foot: normal          Right foot: normal       Inspection:          Left foot: normal          Right foot:  normal       Nails:          Left foot: normal          Right foot: normal   Impression & Recommendations:  Problem # 1:  HYPERTENSION (ICD-401.9) at goal  His updated medication list for this problem includes:    Metoprolol Succinate 100 Mg Tb24 (Metoprolol succinate) .Marland Kitchen... Take one tablet daily    Cardizem Cd 180 Mg Xr24h-cap (Diltiazem hcl coated beads) .Marland Kitchen... Take 1 daily  Orders: TLB-BMP (Basic Metabolic Panel-BMET) (80048-METABOL) Specimen Handling ([address])  BP today: 126/82 Prior BP: 142/80 (09/16/2009)  Labs Reviewed: K+: 4.3 (07/10/2009) Creat: : 0.9 (07/10/2009)   Chol: 133 (02/18/2009)   HDL: 29.70 (02/18/2009)   LDL: 80 (02/18/2009)   TG: 118.0 (02/18/2009)  Problem # 2:  HYPERLIPIDEMIA (ICD-272.4) due for labs , good medication compliance  His updated medication list for this problem includes:    Lipitor 20 Mg  Tabs (Atorvastatin calcium) .Marland Kitchen... Take one tablet daily  Orders: Venipuncture (981[address]) TLB-Lipid Panel (80061-LIPID) Specimen Handling ([address])  Labs Reviewed: SGOT: 34 (09/16/2009)   SGPT: 38 (09/16/2009)   HDL:29.70 (02/18/2009), 30.8 (06/11/2008)  LDL:80 (02/18/2009), 60 (06/11/2008)  Chol:133 (02/18/2009), 127 (06/11/2008)  Trig:118.0 (02/18/2009), 182 (06/11/2008)  Problem # 3:  DIABETES MELLITUS, TYPE II (ICD-250.00) last hemoglobin A1c is slightly high, discussed with patient   his A1c goal, he would prefer to work on diet and exercise before we add a medication. Declined a referral to a nutritionist, information provided about and upcoming free diabetes class His updated medication list for this problem includes:    Metformin Hcl 850 Mg Tabs (Metformin hcl) .Marland Kitchen... Take one tablet twice daily    Bayer Low Strength 81 Mg Tbec (Aspirin)  Orders: TLB-A1C / Hgb A1C (Glycohemoglobin) (83036-A1C) Specimen Handling ([address])  Labs Reviewed: Creat: 0.9 (07/10/2009)     Last Eye Exam: normal (08/22/2008) Reviewed HgBA1c results: 7.3 (06/25/2009)  6.6 (02/18/2009)  Complete Medication List: 1)  Metoprolol Succinate 100 Mg Tb24 (Metoprolol succinate) .... Take one tablet daily 2)  Cardizem Cd 180 Mg Xr24h-cap (Diltiazem hcl coated beads) .... Take 1 daily 3)  Lipitor 20 Mg Tabs (Atorvastatin calcium) .... Take one tablet daily 4)  Metformin Hcl 850 Mg Tabs (Metformin hcl) .... Take one tablet twice daily 5)  Bayer Low Strength 81 Mg Tbec (Aspirin) 6)  Fish Oil Concentrate 120-180 Mg Caps (Omega-3 fatty acids) 7)  Freestyle Lite Test Strp (Glucose blood) .... Check blood sugar 1x/day dx 250.00 8)  Freestyle Lite Lancets  .... Check cbgs once a day dx dm 9)  Mvi   Other Orders: Flu Vaccine 32yrs + (30865) Administration Flu vaccine - MCR (H8469) Flu Vaccine Consent Questions     Do you have a history of severe allergic reactions to this vaccine? no    Any prior history of allergic  reactions to egg and/or gelatin? no    Do you have a sensitivity to the preservative Thimersol? no    Do you have a past history of Guillan-Barre Syndrome? no    Do you currently have an acute febrile illness? no    Have you ever had a severe reaction to latex? no    Vaccine information given and explained to patient? yes    Are you currently pregnant? no    Lot Number:[id number]   Exp Date:11/21/2010   Site Given  Left Deltoid IM Flu Vaccine 17yrs + (62952) Administration Flu vaccine - MCR (W4132)  Patient Instructions: 1)  Please schedule a follow-up appointment in 4 months .  Prescriptions: METFORMIN HCL 850 MG  TABS (METFORMIN HCL) Take one tablet twice daily  #180 x 3   Entered by:   Army Fossa CMA   Authorized by:   Nolon Rod. Paz MD   Signed by:   Army Fossa CMA on 01/20/2010   Method used:   Faxed to ...       [facility] education [facility])       [address] [address]       [place], [place]  [address]       Ph: [phone]       Fax: [phone]   RxID:   [id number] CARDIZEM CD 180 MG XR24H-CAP (DILTIAZEM HCL COATED BEADS) TAKE 1 DAILY  #90 x 3   Entered by:   Army Fossa CMA   Authorized by:   Nolon Rod. Paz MD   Signed by:   Army Fossa CMA on 01/20/2010   Method used:   Faxed to ...       [facility] Environmental education officer)       [address] [address]       [place], [place]  [address]       Ph: [phone]       Fax: [phone]   RxID:   [id number] METOPROLOL SUCCINATE 100 MG  TB24 (METOPROLOL SUCCINATE) Take one tablet daily  #90 x 3   Entered by:   Army Fossa CMA   Authorized by:   Nolon Rod. Paz MD   Signed by:   Army Fossa CMA on 01/20/2010   Method used:   Faxed to ...       [facility] Environmental education officer)       [address] [address]       [place], [place]  [address]       Ph: [phone]       Fax: [phone]   RxID:   [id number] LIPITOR 20 MG  TABS (ATORVASTATIN CALCIUM) Take one tablet daily  #90 x 3   Entered by:   Army Fossa CMA   Authorized by:   Nolon Rod.  Paz MD   Signed by:   Army Fossa CMA on 01/20/2010   Method used:   Faxed to ...       [facility] Environmental education officer)       [address] [address]       [place], [place]  [address]       Ph: [phone]       Fax: [phone]   RxID:   [id number]  .[email or web address]

## 2010-06-23 NOTE — Progress Notes (Signed)
Summary: calling to check on pt  Phone Note Call from Patient   Caller: Patient Summary of Call: pt called c/o heart palpitations, no pain pt says chest feels funny"  recommend pt to [facility] pt agreed .Kandice Hams  July 10, 2009 3:16 PM  Initial call taken by: Kandice Hams,  July 10, 2009 3:16 PM  Follow-up for Phone Call        Alena Bills,  please check on him Icard E. Ramesses Crampton MD  July 11, 2009 8:15 AM   left message with wife to have pt return call Shary Decamp  July 11, 2009 10:02 AM  spoke with pt -- he is feeling better today.  [facility] @ ED was negative.  [place] so he thinks he "over did it"  Advised to call if he has any problems or concerns Shary Decamp  July 11, 2009 12:04 PM

## 2010-06-23 NOTE — Progress Notes (Signed)
Summary: refill request   Phone Note Refill Request Message from:  Fax from Pharmacy on May 26, 2009 4:55 PM  Refills Requested: Medication #1:  METOPROLOL SUCCINATE 100 MG  TB24 Take one tablet daily   Dosage confirmed as above?Dosage Confirmed   Brand Name Necessary? No   Supply Requested: 3 months [facility]   Initial call taken by: Michaelle Copas,  May 26, 2009 4:56 PM    Prescriptions: METOPROLOL SUCCINATE 100 MG  TB24 (METOPROLOL SUCCINATE) Take one tablet daily  #90 x 0   Entered by:   Shary Decamp   Authorized by:   Nolon Rod. Paz MD   Signed by:   Shary Decamp on 05/26/2009   Method used:   Faxed to ...       [facility] Environmental education officer)       [address] [address]       [place], [place]  [address]       Ph: [phone]       Fax: [phone]   RxID:   [id number]

## 2010-06-23 NOTE — Assessment & Plan Note (Signed)
Summary: 4 MONTH FOLLOWUP///SPH   Vital Signs:  Patient profile:   70 year old male Height:      73 inches Weight:      229.8 pounds BMI:     30.43 Pulse rate:   72 / minute BP sitting:   122 / 80  Vitals Entered By: Shary Decamp (June 25, 2009 10:52 AM) CC: rov - fasting   History of Present Illness: Diabetes-- ambulatory BPs 140s in AM   Hypertension-- ambulatory BPs 120-130s  Current Medications (verified): 1)  Cardizem Cd 180 Mg Xr24h-Cap (Diltiazem Hcl Coated Beads) .... Take 1 Daily 2)  Lipitor 20 Mg  Tabs (Atorvastatin Calcium) .... Take One Tablet Daily 3)  Metformin Hcl 850 Mg  Tabs (Metformin Hcl) .... Take One Tablet Twice Daily 4)  Bayer Low Strength 81 Mg Tbec (Aspirin) 5)  Fish Oil Concentrate 120-180 Mg  Caps (Omega-3 Fatty Acids) 6)  Metoprolol Succinate 100 Mg  Tb24 (Metoprolol Succinate) .... Take One Tablet Daily 7)  Freestyle Lite Test   Strp (Glucose Blood) .... Check Blood Sugar 1x/day Dx 250.00 8)  Freestyle Lite Lancets .... Check Cbgs Once A Day Dx Dm  Allergies (verified): No Known Drug Allergies  Past History:  Past Medical History: Diabetes mellitus, type II Hyperlipidemia Hypertension CARCINOMA, BASAL CELL   PREMATURE VENTRICULAR CONTRACTIONS  Screening 07-2008: U/S (-) for AAA, ABIs normal, Carotid u/s R normal, L mild Dz (will order a carotid u/s 07-2009)  Past Surgical History: Reviewed history from 03/01/2007 and no changes required. right shoulder surgery. pilonidal cyst Tonsillectomy  Social History: Reviewed history from 09/28/2007 and no changes required. Retired Cabin crew Academic librarian) Married 2 children, son lives in [place] Never Smoked Alcohol use-yes: 2 glasses of wine daily Drug use-no Regular exercise-yes: plays golf 3 times per week  Review of Systems General:  Denies fever; diet still good slightly  less active due to the weather  . CV:  Denies chest pain or discomfort, shortness of breath with exertion, and  swelling of feet.  Physical Exam  General:  alert and well-developed.   Lungs:  normal respiratory effort, no intercostal retractions, no accessory muscle use, and normal breath sounds.   Heart:  normal rate, regular rhythm, and no murmur.   Extremities:  no edema   Impression & Recommendations:  Problem # 1:  HYPERTENSION (ICD-401.9) at goal  His updated medication list for this problem includes:    Cardizem Cd 180 Mg Xr24h-cap (Diltiazem hcl coated beads) .Marland Kitchen... Take 1 daily    Metoprolol Succinate 100 Mg Tb24 (Metoprolol succinate) .Marland Kitchen... Take one tablet daily  BP today: 122/80 Prior BP: 132/84 (02/18/2009)  Labs Reviewed: K+: 4.9 (02/18/2009) Creat: : 0.9 (02/18/2009)   Chol: 133 (02/18/2009)   HDL: 29.70 (02/18/2009)   LDL: 80 (02/18/2009)   TG: 118.0 (02/18/2009)  Problem # 2:  DIABETES MELLITUS, TYPE II (ICD-250.00) labs  sees eye MD q 6 months  His updated medication list for this problem includes:    Metformin Hcl 850 Mg Tabs (Metformin hcl) .Marland Kitchen... Take one tablet twice daily    Bayer Low Strength 81 Mg Tbec (Aspirin)  Labs Reviewed: Creat: 0.9 (02/18/2009)     Last Eye Exam: normal (08/22/2008) Reviewed HgBA1c results: 6.6 (02/18/2009)  6.6 (10/15/2008)  Orders: Venipuncture (95621) TLB-A1C / Hgb A1C (Glycohemoglobin) (83036-A1C)  Problem # 3:  HYPERLIPIDEMIA (ICD-272.4) labs  His updated medication list for this problem includes:    Lipitor 20 Mg Tabs (Atorvastatin calcium) .Marland Kitchen... Take one tablet  daily  Orders: TLB-ALT (SGPT) (84460-ALT) TLB-AST (SGOT) (84450-SGOT)  Labs Reviewed: SGOT: 37 (10/15/2008)   SGPT: 47 (10/15/2008)   HDL:29.70 (02/18/2009), 30.8 (06/11/2008)  LDL:80 (02/18/2009), 60 (06/11/2008)  Chol:133 (02/18/2009), 127 (06/11/2008)  Trig:118.0 (02/18/2009), 182 (06/11/2008)  Complete Medication List: 1)  Cardizem Cd 180 Mg Xr24h-cap (Diltiazem hcl coated beads) .... Take 1 daily 2)  Lipitor 20 Mg Tabs (Atorvastatin calcium) .... Take one  tablet daily 3)  Metformin Hcl 850 Mg Tabs (Metformin hcl) .... Take one tablet twice daily 4)  Bayer Low Strength 81 Mg Tbec (Aspirin) 5)  Fish Oil Concentrate 120-180 Mg Caps (Omega-3 fatty acids) 6)  Metoprolol Succinate 100 Mg Tb24 (Metoprolol succinate) .... Take one tablet daily 7)  Freestyle Lite Test Strp (Glucose blood) .... Check blood sugar 1x/day dx 250.00 8)  Freestyle Lite Lancets  .... Check cbgs once a day dx dm  Patient Instructions: 1)  Please schedule a follow-up appointment in 4 months .

## 2010-06-23 NOTE — Letter (Signed)
Summary: Primary Care Appointment Letter  [facility]  [address] [place], [place] [address]   Phone: [phone]  Fax: [phone]    07/08/2009 MRN: [id number]  CLARNCE HOMAN [address] [place], [place]  [address]  Dear Mr. DEFINA,   Your Primary Care Physician [place] E. Paz MD has indicated that:    _______it is time to schedule an appointment.    _______you missed your appointment on______ and need to call and          reschedule.    _______you need to have lab work done.    _______you need to schedule an appointment discuss lab or test results.    ____X___  YOUR APPOINTMENT FOR MAY 14,2011 AT 8:00 AM HAS BEEN RESCHEDULE TO 8:40 AM ON MAY 14,2011                    Please call our office as soon as possible. Our phone number is [phone]          _________. Please press option 1. Our office is open 8a-12noon and 1p-5p, Monday through Friday.     Thank you,    [facility]

## 2010-06-25 NOTE — Assessment & Plan Note (Signed)
Summary: rto 4 months/cbs   Vital Signs:  Patient profile:   70 year old male Weight:      224 pounds Pulse rate:   66 / minute Pulse rhythm:   regular BP sitting:   132 / 82  (left arm) Cuff size:   large  Vitals Entered By: Army Fossa CMA (May 19, 2010 8:27 AM) CC: 4 month f/u- fasting  Comments no complaints  [facility]    History of Present Illness: ROV went to a DM  class  diet-- very good ambulatory CBGs  ~ 122 in the mornings ambulatory BPs wnl   ROS  cardiology  increased cardiazem dose d/t palpitations  has seen derm recently, everything ok foot pain sometimes : located proximal from 3-4th toe, pain is at the  plantar aspect , "no pain, just pressure" denies CP-SOB -edema   Current Medications (verified): 1)  Metoprolol Succinate 100 Mg  Tb24 (Metoprolol Succinate) .... Take One Tablet Daily 2)  Cardizem Cd 240 Mg Xr24h-Cap (Diltiazem Hcl Coated Beads) .Marland Kitchen.. 1 By Mouth Daily. 3)  Lipitor 20 Mg  Tabs (Atorvastatin Calcium) .... Take One Tablet Daily 4)  Metformin Hcl 850 Mg  Tabs (Metformin Hcl) .... Take One Tablet Twice Daily 5)  Bayer Low Strength 81 Mg Tbec (Aspirin) 6)  Fish Oil Concentrate 120-180 Mg  Caps (Omega-3 Fatty Acids) 7)  Freestyle Lite Test   Strp (Glucose Blood) .... Check Blood Sugar 1x/day Dx 250.00 8)  Freestyle Lite Lancets .... Check Cbgs Once A Day Dx Dm 9)  Mvi  Allergies (verified): No Known Drug Allergies  Past History:  Past Medical History: Reviewed history from 01/20/2010 and no changes required. Diabetes mellitus, type II Hyperlipidemia Hypertension CARCINOMA, BASAL CELL   PREMATURE VENTRICULAR CONTRACTIONS  Screening 07-2008: U/S (-) for AAA, ABIs normal, Carotid u/s R normal, L mild Dz (will order a carotid u/s 07-2009)  Past Surgical History: Reviewed history from 03/01/2007 and no changes required. right shoulder surgery. pilonidal cyst Tonsillectomy  Social History: Reviewed history from 09/16/2009  and no changes required. 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   Lungs:  normal respiratory effort, no intercostal retractions, no accessory muscle use, and normal breath sounds.   Heart:  normal rate, regular rhythm, no murmur, and no gallop.   Extremities:  no pretibial edema bilaterally   Diabetes Management Exam:    Foot Exam (with socks and/or shoes not present):       Sensory-Pinprick/Light touch:          Left medial foot (L-4): normal          Left dorsal foot (L-5): normal          Left lateral foot (S-1): normal          Right medial foot (L-4): normal          Right dorsal foot (L-5): normal          Right lateral foot (S-1): normal       Sensory-Monofilament:          Left foot: normal          Right foot: normal       Inspection:          Left foot: normal  Right foot: normal       Nails:          Left foot: thickened          Right foot: thickened   Impression & Recommendations:  Problem # 1:  HYPERTENSION (ICD-401.9) at goal  His updated medication list for this problem includes:    Metoprolol Succinate 100 Mg Tb24 (Metoprolol succinate) .Marland Kitchen... Take one tablet daily    Cardizem Cd 240 Mg Xr24h-cap (Diltiazem hcl coated beads) .Marland Kitchen... 1 by mouth daily.  BP today: 132/82 Prior BP: 126/82 (01/20/2010)  Labs Reviewed: K+: 4.6 (01/20/2010) Creat: : 0.9 (01/20/2010)   Chol: 139 (01/20/2010)   HDL: 31.10 (01/20/2010)   LDL: 80 (01/20/2010)   TG: 138.0 (01/20/2010)  Problem # 2:  HYPERLIPIDEMIA (ICD-272.4)  at goal  His updated medication list for this problem includes:    Lipitor 20 Mg Tabs (Atorvastatin calcium) .Marland Kitchen... Take one tablet daily  Labs Reviewed: SGOT: 34 (09/16/2009)   SGPT: 38 (09/16/2009)   HDL:31.10 (01/20/2010), 29.70  (02/18/2009)  LDL:80 (01/20/2010), 80 (02/18/2009)  Chol:139 (01/20/2010), 133 (02/18/2009)  Trig:138.0 (01/20/2010), 118.0 (02/18/2009)  Orders: TLB-ALT (SGPT) (84460-ALT) TLB-AST (SGOT) (84450-SGOT) TLB-Microalbumin/Creat Ratio, Urine (82043-MALB)  Problem # 3:  DIABETES MELLITUS, TYPE II (ICD-250.00)  seems well controlled  His updated medication list for this problem includes:    Metformin Hcl 850 Mg Tabs (Metformin hcl) .Marland Kitchen... Take one tablet twice daily    Bayer Low Strength 81 Mg Tbec (Aspirin)  Labs Reviewed: Creat: 0.9 (01/20/2010)     Last Eye Exam: normal (08/22/2008) Reviewed HgBA1c results: 6.7 (01/20/2010)  7.3 (06/25/2009)  Orders: Venipuncture (74259) TLB-A1C / Hgb A1C (Glycohemoglobin) (83036-A1C)  Problem # 4:  Foot pain  has "pressure at the R foot"--- morton's neuroma? d/t flat feet-bunion-----> observation, will see ortho (Dr Jerl Santos)  if symptoms increase  Complete Medication List: 1)  Metoprolol Succinate 100 Mg Tb24 (Metoprolol succinate) .... Take one tablet daily 2)  Cardizem Cd 240 Mg Xr24h-cap (Diltiazem hcl coated beads) .Marland Kitchen.. 1 by mouth daily. 3)  Lipitor 20 Mg Tabs (Atorvastatin calcium) .... Take one tablet daily 4)  Metformin Hcl 850 Mg Tabs (Metformin hcl) .... Take one tablet twice daily 5)  Bayer Low Strength 81 Mg Tbec (Aspirin) 6)  Fish Oil Concentrate 120-180 Mg Caps (Omega-3 fatty acids) 7)  Freestyle Lite Test Strp (Glucose blood) .... Check blood sugar 1x/day dx 250.00 8)  Freestyle Lite Lancets  .... Check cbgs once a day dx dm 9)  Mvi   Patient Instructions: 1)  Please schedule a follow-up appointment in 4 to 5 months .  Prescriptions: FREESTYLE LITE TEST   STRP (GLUCOSE BLOOD) check blood sugar 1x/day dx 250.00  #3 mo supply x 3   Entered by:   Army Fossa CMA   Authorized by:   Nolon Rod. Harvey Lingo MD   Signed by:   Army Fossa CMA on 05/19/2010   Method used:   Faxed to ...       [facility] education [facility])       [address]  [address]       [place], [place]  [address]       Ph: [phone]       Fax: [phone]   RxID:   [id number] FREESTYLE LITE LANCETS check CBGs once a day DX DM  #3 mo supply x 3   Entered by:   Army Fossa CMA   Authorized by:   Nolon Rod. Shalene Gallen MD   Signed by:  Army Fossa CMA on 05/19/2010   Method used:   Faxed to ...       [facility] education [facility])       [address] [address]       [place], [place]  [address]       Ph: [phone]       Fax: [phone]   RxID:   [id number]    Orders Added: 1)  Venipuncture [95188] 2)  TLB-A1C / Hgb A1C (Glycohemoglobin) [83036-A1C] 3)  TLB-ALT (SGPT) [84460-ALT] 4)  TLB-AST (SGOT) [84450-SGOT] 5)  TLB-Microalbumin/Creat Ratio, Urine [82043-MALB] 6)  Est. Patient Level III [41660]

## 2010-08-07 ENCOUNTER — Encounter: Payer: Self-pay | Admitting: Internal Medicine

## 2010-08-07 ENCOUNTER — Ambulatory Visit (INDEPENDENT_AMBULATORY_CARE_PROVIDER_SITE_OTHER): Payer: Medicare Other | Admitting: Internal Medicine

## 2010-08-07 DIAGNOSIS — J069 Acute upper respiratory infection, unspecified: Secondary | ICD-10-CM

## 2010-08-11 NOTE — Assessment & Plan Note (Signed)
Summary: congested/cbs   Vital Signs:  Patient profile:   70 year old male Height:      73 inches Weight:      219.38 pounds BMI:     29.05 Temp:     98.1 degrees F oral Pulse rate:   65 / minute Pulse rhythm:   regular BP sitting:   134 / 84  (left arm) Cuff size:   large  Vitals Entered By: Army Fossa CMA (August 07, 2010 10:39 AM) CC: Pt here c/o nasal and head congestion x 1 week. Comments tried OTC meds [facility] [address]    History of Present Illness: symptoms x 6 days: RN, sinus congestion, cough (thinks related to PND) coricidin otc--no help  Current Medications (verified): 1)  Metoprolol Succinate 100 Mg  Tb24 (Metoprolol Succinate) .... Take One Tablet Daily 2)  Cardizem Cd 240 Mg Xr24h-Cap (Diltiazem Hcl Coated Beads) .Marland Kitchen.. 1 By Mouth Daily. 3)  Lipitor 20 Mg  Tabs (Atorvastatin Calcium) .... Take One Tablet Daily 4)  Metformin Hcl 850 Mg  Tabs (Metformin Hcl) .... Take One Tablet Twice Daily 5)  Bayer Low Strength 81 Mg Tbec (Aspirin) 6)  Fish Oil Concentrate 120-180 Mg  Caps (Omega-3 Fatty Acids) 7)  Freestyle Lite Test   Strp (Glucose Blood) .... Check Blood Sugar 1x/day Dx 250.00 8)  Freestyle Lite Lancets .... Check Cbgs Once A Day Dx Dm 9)  Mvi  Allergies (verified): No Known Drug Allergies  Past History:  Past Medical History: Reviewed history from 01/20/2010 and no changes required. Diabetes mellitus, type II Hyperlipidemia Hypertension CARCINOMA, BASAL CELL   PREMATURE VENTRICULAR CONTRACTIONS  Screening 07-2008: U/S (-) for AAA, ABIs normal, Carotid u/s R normal, L mild Dz (will order a carotid u/s 07-2009)  Past Surgical History: Reviewed history from 03/01/2007 and no changes required. right shoulder surgery. pilonidal cyst Tonsillectomy  Social History: Reviewed history from 09/16/2009 and no changes required. 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 ENT:  Denies earache and hoarseness; nasal d/c clear, slightly  yellow in am . Resp:  no actual sputum no chest congestion. GI:  Denies diarrhea, nausea, and vomiting. MS:  Denies muscle aches.  Physical Exam  General:  alert and well-developed.   Head:  face symetric, no TTP Ears:  R ear normal and L ear w/ wax , partially removed w/ a spoon, still unable to see the TM Nose:  mod congestion Mouth:  no red  Lungs:  normal respiratory effort, no intercostal retractions, no accessory muscle use, and normal breath sounds.   Heart:  normal rate, regular rhythm, no murmur, and no gallop.     Impression & Recommendations:  Problem # 1:  URI (ICD-465.9)  URI see instructions   His updated medication list for this problem includes:    Bayer Low Strength 81 Mg Tbec (Aspirin)  Complete Medication List: 1)  Metoprolol Succinate 100 Mg Tb24 (Metoprolol succinate) .... Take one tablet daily 2)  Cardizem Cd 240 Mg Xr24h-cap (Diltiazem hcl coated beads) .Marland Kitchen.. 1 by mouth daily. 3)  Lipitor 20 Mg Tabs (Atorvastatin calcium) .... Take one tablet daily 4)  Metformin Hcl 850 Mg Tabs (Metformin hcl) .... Take one tablet twice daily 5)  Bayer Low Strength 81 Mg Tbec (Aspirin) 6)  Fish Oil Concentrate 120-180  Mg Caps (Omega-3 fatty acids) 7)  Freestyle Lite Test Strp (Glucose blood) .... Check blood sugar 1x/day dx 250.00 8)  Freestyle Lite Lancets  .... Check cbgs once a day dx dm 9)  Mvi  10)  Amoxicillin 500 Mg Tabs (Amoxicillin) .... 2 by mouth two times a day 11)  Astepro 0.15 % Soln (Azelastine hcl) .... 2 sprays at bedtime  Patient Instructions: 1)  mucinex DM two times a day 2)  until better 3)  astepro at night, 2 sprays on each side 4)  nasal irrigation 5)  rest-fluids 6)  amoxicillin if no better in few days 7)  call if symptoms increase   Prescriptions: ASTEPRO 0.15 % SOLN (AZELASTINE HCL) 2 sprays at bedtime  #1 x 1   Entered and Authorized by:   Elita Quick E. Steffi Noviello MD   Signed by:   Nolon Rod. Adison Reifsteck MD on 08/07/2010   Method used:   Print then Give to Patient   RxID:   [id number] AMOXICILLIN 500 MG TABS (AMOXICILLIN) 2 by mouth two times a day  #28 x 0   Entered and Authorized by:   Nolon Rod. Rhen Kawecki MD   Signed by:   Nolon Rod. Chelesea Weiand MD on 08/07/2010   Method used:   Print then Give to Patient   RxID:   [id number]    Orders Added: 1)  Est. Patient Level III [84696]

## 2010-08-13 LAB — POCT CARDIAC MARKERS

## 2010-08-13 LAB — DIFFERENTIAL
Basophils Relative: 0 % (ref 0–1)
Eosinophils Absolute: 0.1 10*3/uL (ref 0.0–0.7)
Eosinophils Relative: 3 % (ref 0–5)
Monocytes Relative: 23 % — ABNORMAL HIGH (ref 3–12)
Neutro Abs: 2.3 10*3/uL (ref 1.7–7.7)

## 2010-08-13 LAB — CBC
Hemoglobin: 14.1 g/dL (ref 13.0–17.0)
MCHC: 34.7 g/dL (ref 30.0–36.0)
Platelets: 225 10*3/uL (ref 150–400)
RBC: 4.32 MIL/uL (ref 4.22–5.81)
WBC: 4.3 10*3/uL (ref 4.0–10.5)

## 2010-08-13 LAB — BASIC METABOLIC PANEL
CO2: 29 mEq/L (ref 19–32)
Calcium: 9 mg/dL (ref 8.4–10.5)
Glucose, Bld: 155 mg/dL — ABNORMAL HIGH (ref 70–99)
Potassium: 4.3 mEq/L (ref 3.5–5.1)

## 2010-10-05 ENCOUNTER — Encounter: Payer: Self-pay | Admitting: Internal Medicine

## 2010-10-06 ENCOUNTER — Encounter: Payer: Self-pay | Admitting: Internal Medicine

## 2010-10-06 ENCOUNTER — Ambulatory Visit (INDEPENDENT_AMBULATORY_CARE_PROVIDER_SITE_OTHER): Payer: Medicare Other | Admitting: Internal Medicine

## 2010-10-06 DIAGNOSIS — E785 Hyperlipidemia, unspecified: Secondary | ICD-10-CM

## 2010-10-06 DIAGNOSIS — E119 Type 2 diabetes mellitus without complications: Secondary | ICD-10-CM

## 2010-10-06 DIAGNOSIS — I10 Essential (primary) hypertension: Secondary | ICD-10-CM

## 2010-10-06 LAB — LIPID PANEL
HDL: 32.5 mg/dL — ABNORMAL LOW (ref >39.00)
LDL Cholesterol: 71 mg/dL (ref 0–99)
Total CHOL/HDL Ratio: 4
Triglycerides: 105 mg/dL (ref 0.0–149.0)
VLDL: 21 mg/dL (ref 0.0–40.0)

## 2010-10-06 LAB — ALT: ALT: 32 U/L (ref 0–53)

## 2010-10-06 LAB — AST: AST: 27 U/L (ref 0–37)

## 2010-10-06 LAB — CBC WITH DIFFERENTIAL/PLATELET
Basophils Absolute: 0 10*3/uL (ref 0.0–0.1)
Eosinophils Absolute: 0.4 10*3/uL (ref 0.0–0.7)
Hemoglobin: 13.6 g/dL (ref 13.0–17.0)
Lymphocytes Relative: 27 % (ref 12.0–46.0)
MCHC: 35 g/dL (ref 30.0–36.0)
Neutro Abs: 3.7 10*3/uL (ref 1.4–7.7)
Neutrophils Relative %: 56.9 % (ref 43.0–77.0)
Platelets: 229 10*3/uL (ref 150.0–400.0)
RDW: 13.6 % (ref 11.5–14.6)

## 2010-10-06 LAB — MICROALBUMIN / CREATININE URINE RATIO: Microalb Creat Ratio: 0.5 mg/g (ref 0.0–30.0)

## 2010-10-06 NOTE — Assessment & Plan Note (Signed)
At goal, labs  

## 2010-10-06 NOTE — Assessment & Plan Note (Signed)
Good compliance w/ meds. Labs

## 2010-10-06 NOTE — Assessment & Plan Note (Signed)
Seems well-controlled. Labs 

## 2010-10-06 NOTE — Progress Notes (Signed)
  Subjective:    Patient ID: Chase Martinez, male    DOB: [birth year], 70 y.o.   MRN: [id number]  HPI  Routine office visit Diabetes, I reviewed the patient's blood sugar log, average is 121 before breakfast. Good medication compliance. Hypertension, he is ambulatory BPs are very good, good medication compliance without apparent side effects. Cholesterol, on Lipitor, no apparent side effects.  3 weeks ago, developed a pain in swelling and a right Achilles tendon. No actual injury. Pain is worse after he started walking, when he plays golf.    Past Medical History  Diagnosis Date  . Diabetes mellitus   . Hyperlipidemia   . Hypertension   . Basal cell carcinoma   . Premature ventricular contractions    Past Surgical History  Procedure Date  . Shoulder surgery     right  . Pilonidal cyst excision   . Tonsillectomy     Review of Systems No chest pain or shortness of breath. No nausea, vomiting, diarrhea    Objective:   Physical Exam Alert oriented in no apparent distress. Lungs clear to auscultation bilaterally. Cardiovascular regular rate and rhythm without a murmur. Extremities: Left Achilles tendon normal. Right Achilles tendon with a 1.5 cm soft, slight tender swelling and slight discomfort in the heel. DIABETIC FOOT EXAM: Pinprick examination normal, good capillary refills bilaterally, some calluses. Nails well trimmed         Assessment & Plan:  Swellin of the R Achilles tendon, plans to  see Dr. Jerl Santos for further evaluation

## 2010-10-08 ENCOUNTER — Telehealth: Payer: Self-pay | Admitting: *Deleted

## 2010-10-08 NOTE — Telephone Encounter (Signed)
Message left for patient to return my call.  

## 2010-10-08 NOTE — Telephone Encounter (Signed)
Message copied by Army Fossa on Thu Oct 08, 2010  9:44 AM ------      Message from: Chase Martinez      Created: Thu Oct 08, 2010  6:33 AM       Advise patient:      His cholesterol and DM cont to be well controlled: good results!      Other labs normal

## 2010-10-08 NOTE — Telephone Encounter (Signed)
Pt is aware, labs sent to pt.

## 2010-10-09 NOTE — Op Note (Signed)
Chase Martinez, Chase Martinez                          ACCOUNT NO.:  [id number]   MEDICAL RECORD NO.:  [id number]                   PATIENT TYPE:  AMB   LOCATION:  [facility]                                  FACILITY:  [facility]   PHYSICIAN:  Lubertha Basque. Jerl Santos, M.D.             DATE OF BIRTH:  [birth year]   DATE OF PROCEDURE:  07/30/2003  DATE OF DISCHARGE:                                 OPERATIVE REPORT   PREOPERATIVE DIAGNOSIS:  Right shoulder impingement.   POSTOPERATIVE DIAGNOSIS:  1. Right shoulder impingement.  2. Right shoulder rotator cuff tear.  3. Right shoulder biceps tear.   PROCEDURE:  1. Right shoulder arthroscopic acromioplasty.  2. Right shoulder arthroscopic debridement and removal of loose bodies.  3. Open biceps tenodesis.   ANESTHESIA:  General and block.   SURGEON:  Lubertha Basque. Jerl Santos, M.D.   ASSISTANT:  Lindwood Qua, P.A.   INDICATIONS FOR PROCEDURE:  The patient is a 70 year old man with a many  month history of right shoulder pain.  This has persisted despite oral anti-  inflammatories, rest, subacromial injection, and an exercise program.  With  continued pain with activity and pain at rest, he is offered an arthroscopy.  Informed operative consent was obtained after discussing the possible  complications of reaction to anesthesia, infection, and neurovascular  injury.   DESCRIPTION OF PROCEDURE:  The patient was taken to the operating room suite  where general anesthetic was applied without difficulty.  He was also given  a block in the preanesthesia area.  He was positioned in the beach chair  position and prepped and draped in normal sterile fashion.  After  administration of preoperative IV antibiotics, an arthroscopy of the right  shoulder was performed through a total of three portals.  The glenohumeral  joint showed minimal degenerative change.  The labral structures all  appeared intact.  He had a massive rotator cuff tear which was retracted  back to the  glenoid.  He also had shredding of his biceps tendon with only  about 20% of the structure in continuity.  An acromioplasty was done back to  a flat surface.  This was done with the bur in the lateral position followed  by transfer of the bur to the posterior position.  I also debrided the  rotator cuff slightly just in some small areas back to the glenoid rim.  The  cuff was retracted enough to where I could not advance it anywhere near the  greater tuberosity, so it seemed best to do the debridement rather than any  heroic repair.  I placed a tag suture in the biceps arthroscopically and  then released this at the glenoid.  We then made about a 2 cm incision from  the anterior aspect of the acromion.  Through a deltoid split, I retrieved  the two sutures in the biceps which had been placed arthroscopically.  Under  appropriate tension, I  then sutured the biceps tendon in the bicipital  groove with #2 Ethibond suture.  I did not place any bony anchors.  The  wound was irrigated followed by reapproximation of both layers of deltoid  fascia with 0 Vicryl suture.  2-0 undyed Vicryl was used to reapproximate  the subcutaneous tissues followed by nylon in the skin.  Nylon was also used  to reapproximate the three portals loosely.  Adaptic was placed on the  wounds followed by dry gauze and tape.  Estimated blood loss and fluids can  be obtained from anesthesia records.   DISPOSITION:  The patient was extubated in the operating room and taken to  the recovery room in stable condition.  The plans were for him to go home  the same day and follow up in the office next week.  I will contact him by  phone tonight.                                               Lubertha Basque Jerl Santos, M.D.    PGD/MEDQ  D:  07/30/2003  T:  07/30/2003  Job:  [id number]

## 2011-01-14 ENCOUNTER — Other Ambulatory Visit: Payer: Self-pay | Admitting: Internal Medicine

## 2011-02-09 ENCOUNTER — Encounter: Payer: Self-pay | Admitting: Internal Medicine

## 2011-02-09 ENCOUNTER — Telehealth: Payer: Self-pay

## 2011-02-09 ENCOUNTER — Ambulatory Visit (INDEPENDENT_AMBULATORY_CARE_PROVIDER_SITE_OTHER): Payer: Medicare Other | Admitting: Internal Medicine

## 2011-02-09 DIAGNOSIS — E785 Hyperlipidemia, unspecified: Secondary | ICD-10-CM

## 2011-02-09 DIAGNOSIS — I10 Essential (primary) hypertension: Secondary | ICD-10-CM

## 2011-02-09 DIAGNOSIS — E119 Type 2 diabetes mellitus without complications: Secondary | ICD-10-CM

## 2011-02-09 LAB — BASIC METABOLIC PANEL
BUN: 13 mg/dL (ref 6–23)
CO2: 28 mEq/L (ref 19–32)
Chloride: 102 mEq/L (ref 96–112)
Creatinine, Ser: 0.9 mg/dL (ref 0.4–1.5)

## 2011-02-09 NOTE — Assessment & Plan Note (Signed)
Well-controlled, no change 

## 2011-02-09 NOTE — Assessment & Plan Note (Signed)
Well controlled, labs  

## 2011-02-09 NOTE — Assessment & Plan Note (Signed)
Good compliance with medication, he does have a callus on physical exam, diabetic foot prevention discussed. Labs

## 2011-02-09 NOTE — Progress Notes (Signed)
  Subjective:    Patient ID: Chase Martinez, male    DOB: 1940/08/29, 70 y.o.   MRN: [id number]  HPI Routine office visit (unable to do CPX due to computers down) Was seen with right ankle pain, so orthopedic surgery, status post a boot, doing better. Diabetes--good medication compliance, CBGs between 101 and 131. Hypertension--good ambulatory readings around 120/70. Good medication compliance.  Past Medical History  Diagnosis Date  . Diabetes mellitus   . Hyperlipidemia   . Hypertension   . Basal cell carcinoma   . Premature ventricular contractions    Past Surgical History  Procedure Date  . Shoulder surgery     right  . Pilonidal cyst excision   . Tonsillectomy      Review of Systems Denies chest pain or shortness of breath No lower extremity edema No nausea, vomiting, diarrhea. No symptoms consistent with low blood sugars.     Objective:   Physical Exam  Constitutional: He is oriented to person, place, and time. He appears well-developed and well-nourished.  HENT:  Head: Normocephalic and atraumatic.  Cardiovascular: Normal rate, regular rhythm and normal heart sounds.   Pulmonary/Chest: Breath sounds normal. No respiratory distress. He has no wheezes. He has no rales.  Musculoskeletal: He exhibits no edema.       DIABETIC FEET EXAM: No lower extremity edema Skin and nails are normal  Has a callus at the base of L great toe. No d/c or red Pinprick examination of the feet normal.   Neurological: He is alert and oriented to person, place, and time.  Psychiatric: He has a normal mood and affect. His behavior is normal. Judgment and thought content normal.          Assessment & Plan:  Office visit was performed while all the computers were down, documentation done  after the visit. We'll ask the patient to come back in 4 months for a complete physical exam

## 2011-02-09 NOTE — Telephone Encounter (Signed)
Per Dr. Drue Novel called to make pt aware to make a follow up appointment in 4 months.  Left a message for pt.

## 2011-02-16 ENCOUNTER — Telehealth: Payer: Self-pay

## 2011-02-16 NOTE — Telephone Encounter (Signed)
Left message on voicemail with lab results, patient to return call to indicate which pharmacy rx(metformin 1000mg  2x daily) should be sent to

## 2011-02-16 NOTE — Telephone Encounter (Signed)
Message copied by Edgardo Roys on Tue Feb 16, 2011 10:21 AM ------      Message from: Blain Pais T      Created: Thu Feb 11, 2011 12:25 PM                   ----- Message -----         From: Orlan Leavens, MA         Sent: 02/11/2011  12:05 PM           To: Barnie Mort                        ----- Message -----         From: Wanda Plump, MD         Sent: 02/11/2011   9:26 AM           To: Orlan Leavens, MA            Advise patient:      Diabetes used to be better, change metformin 850 mg to -----> 1000 mg twice a day. Call a prescription       Watch diet and exercise      Ask to schedule a complete physical exam in 4 months.

## 2011-02-17 MED ORDER — METFORMIN HCL 1000 MG PO TABS: 1000.0000 mg | ORAL_TABLET | Freq: Two times a day (BID) | ORAL | Status: DC

## 2011-02-17 NOTE — Telephone Encounter (Signed)
Done to [facility] per patient request.

## 2011-02-25 ENCOUNTER — Other Ambulatory Visit: Payer: Self-pay | Admitting: Internal Medicine

## 2011-02-25 NOTE — Telephone Encounter (Signed)
Last seen 02/09/11, CPX on 06/14/11.  #90 x 1 sent to pharm.

## 2011-05-03 ENCOUNTER — Ambulatory Visit: Payer: Medicare Other | Admitting: Internal Medicine

## 2011-05-03 ENCOUNTER — Ambulatory Visit (INDEPENDENT_AMBULATORY_CARE_PROVIDER_SITE_OTHER): Payer: Medicare Other | Admitting: Internal Medicine

## 2011-05-03 ENCOUNTER — Encounter: Payer: Self-pay | Admitting: Internal Medicine

## 2011-05-03 VITALS — BP 130/78 | HR 74 | Temp 98.2°F | Wt 220.0 lb

## 2011-05-03 DIAGNOSIS — N419 Inflammatory disease of prostate, unspecified: Secondary | ICD-10-CM

## 2011-05-03 DIAGNOSIS — E119 Type 2 diabetes mellitus without complications: Secondary | ICD-10-CM

## 2011-05-03 DIAGNOSIS — N400 Enlarged prostate without lower urinary tract symptoms: Secondary | ICD-10-CM

## 2011-05-03 LAB — POCT URINALYSIS DIPSTICK
Bilirubin, UA: NEGATIVE
Leukocytes, UA: NEGATIVE
Nitrite, UA: NEGATIVE
Urobilinogen, UA: 0.2
pH, UA: 6

## 2011-05-03 LAB — PSA: PSA: 2.04 ng/mL (ref 0.10–4.00)

## 2011-05-03 MED ORDER — TAMSULOSIN HCL 0.4 MG PO CAPS: 0.4000 mg | ORAL_CAPSULE | ORAL | Status: DC

## 2011-05-03 NOTE — Progress Notes (Signed)
  Subjective:    Patient ID: Chase Martinez, male    DOB: 03/23/1941, 70 y.o.   MRN: [id number]  HPI Patient was prescribed cyclobenzaprine and Aleve by his dentist, he took them from November 28  To  December 2 for a TMJ problem. Shortly after he started to take those medications he develop difficulty urinating and a feeling that he was not emptying his bladder. Symptoms are worse at night. Despite the fact that he has to stop the new  medications, the symptoms continue and are even slightly worse.   Past Medical History  Diagnosis Date  . Diabetes mellitus   . Hyperlipidemia   . Hypertension   . Basal cell carcinoma   . Premature ventricular contractions    Past Surgical History  Procedure Date  . Shoulder surgery     right  . Pilonidal cyst excision   . Tonsillectomy      Review of Systems No fever chills No nausea, vomiting, diarrhea or blood in the stools. Has  chronic nocturia, also about a 1 or 2 times at night. Symptoms at baseline. Mild dysuria before he urinates.     Objective:   Physical Exam  Constitutional: He is oriented to person, place, and time. He appears well-developed and well-nourished. No distress.  Abdominal: Soft. Bowel sounds are normal. He exhibits no distension. There is no tenderness. There is no rebound.       No CVA tenderness  Genitourinary:       external hemorrhoids noted, normal rectum, prostate slightly enlarged, not tender or nodular  Neurological: He is alert and oriented to person, place, and time.  Skin: He is not diaphoretic.  Psychiatric: He has a normal mood and affect. His behavior is normal.       Assessment & Plan:

## 2011-05-03 NOTE — Patient Instructions (Signed)
If no better in few days, call if symptoms severe, fever Keep your appointment in January, stay on flomax until then

## 2011-05-03 NOTE — Assessment & Plan Note (Addendum)
Sx c/w  BPH but sx  increased acutely, related to flexeril? Infection? Plan: Udip + sugar, we did a  CBG : ~170 ucx Check a PSA, if elevated will treat w/abx flomax Consider empiric abx even if PSA normal

## 2011-05-05 LAB — URINE CULTURE: Colony Count: NO GROWTH

## 2011-06-14 ENCOUNTER — Encounter: Payer: Medicare Other | Admitting: Internal Medicine

## 2011-06-20 ENCOUNTER — Other Ambulatory Visit: Payer: Self-pay | Admitting: Internal Medicine

## 2011-06-21 ENCOUNTER — Encounter: Payer: Self-pay | Admitting: Internal Medicine

## 2011-06-21 ENCOUNTER — Ambulatory Visit (INDEPENDENT_AMBULATORY_CARE_PROVIDER_SITE_OTHER): Payer: Medicare Other | Admitting: Internal Medicine

## 2011-06-21 DIAGNOSIS — E119 Type 2 diabetes mellitus without complications: Secondary | ICD-10-CM

## 2011-06-21 DIAGNOSIS — E785 Hyperlipidemia, unspecified: Secondary | ICD-10-CM | POA: Diagnosis not present

## 2011-06-21 DIAGNOSIS — Z0000 Encounter for general adult medical examination without abnormal findings: Secondary | ICD-10-CM | POA: Diagnosis not present

## 2011-06-21 DIAGNOSIS — N400 Enlarged prostate without lower urinary tract symptoms: Secondary | ICD-10-CM

## 2011-06-21 DIAGNOSIS — I10 Essential (primary) hypertension: Secondary | ICD-10-CM

## 2011-06-21 DIAGNOSIS — Z23 Encounter for immunization: Secondary | ICD-10-CM

## 2011-06-21 LAB — LIPID PANEL
Cholesterol: 149 mg/dL (ref 0–200)
LDL Cholesterol: 76 mg/dL (ref 0–99)

## 2011-06-21 LAB — MICROALBUMIN / CREATININE URINE RATIO
Creatinine,U: 30.8 mg/dL
Microalb Creat Ratio: 1.6 mg/g (ref 0.0–30.0)
Microalb, Ur: 0.5 mg/dL (ref 0.0–1.9)

## 2011-06-21 LAB — CBC WITH DIFFERENTIAL/PLATELET
Eosinophils Relative: 5.1 % — ABNORMAL HIGH (ref 0.0–5.0)
Lymphocytes Relative: 18.7 % (ref 12.0–46.0)
Monocytes Relative: 8 % (ref 3.0–12.0)
Neutrophils Relative %: 67.6 % (ref 43.0–77.0)
Platelets: 263 10*3/uL (ref 150.0–400.0)
WBC: 7.5 10*3/uL (ref 4.5–10.5)

## 2011-06-21 LAB — HEMOGLOBIN A1C: Hgb A1c MFr Bld: 7.1 % — ABNORMAL HIGH (ref 4.6–6.5)

## 2011-06-21 MED ORDER — TAMSULOSIN HCL 0.4 MG PO CAPS: 0.4000 mg | ORAL_CAPSULE | ORAL | Status: DC

## 2011-06-21 NOTE — Assessment & Plan Note (Signed)
Due for labs

## 2011-06-21 NOTE — Assessment & Plan Note (Addendum)
Td 2003, Tdap today Pneumonia shot 5-09 Had a flu shot already PSA 02-2007 1.5  had shingles shot 2009 aprox Cscope-- saw Dr Luther Parody in 2002 and had a Cscope, we discussed doing another Scope, he agreed, will refer to [facility]

## 2011-06-21 NOTE — Patient Instructions (Signed)
Check the  blood pressure 2 or 3 times a week, be sure it is less than 140/85. If it is consistently higher, let me know  

## 2011-06-21 NOTE — Assessment & Plan Note (Signed)
amb BPs wnl, no change for now

## 2011-06-21 NOTE — Progress Notes (Signed)
  Subjective:    Patient ID: Chase Martinez, male    DOB: [birth year], 70 y.o.   MRN: [id number]  HPI Here for Medicare AWV:  1. Risk factors based on Past M, S, F history: reviewed 2. Physical Activities: still plays golf  3. Depression/mood: No problems noted or reported  4. Hearing:  No problemss noted or reported  5. ADL's:  Totally independent , still drives  6. Fall Risk: no recent falls, low risk 7. home Safety: does feelsafe at home  8. Height, weight, &visual acuity: see VS, vision corrected w/glasses, sees eye doctor regulalrly 9. Counseling: provided 10. Labs ordered based on risk factors: if needed  11. Referral Coordination: if needed 12.  Care Plan, see assessment and plan  13.   Cognitive Assessment: Cognition  and motor appropriate  In addition, today we discussed the following: flomax-- good compliance , helping significantly with prostate symptoms DM-- good med compliance, average fasting CBG 128 HTN-- good med compliance, average BP <140/80 Cholesterol-- good compliance w/ meds w/o apparent s/e  Past Medical History: Diabetes mellitus, type II hyperlipidemia Hypertension BCC H/O PVCs    Past Surgical History: right shoulder surgery. pilonidal cyst Tonsillectomy   Family History: CAD - no HTN - M DM - no stroke - no colon Ca - no prostate Ca - no Dementia - F thyroid Ca - son Parkinson - sister glaucoma - M  Social History Retired Cabin crew Academic librarian) Married, 2 children, son lives in [place] Never Smoked Alcohol use-yes: 2 glasses of wine daily Drug use-no Regular exercise-yes: plays golf 3 times per week  Review of Systems  Constitutional: Negative for fever and fatigue.  Respiratory: Negative for cough and shortness of breath.   Cardiovascular: Negative for chest pain. Palpitations: occ palpitations, randomly.  Gastrointestinal: Negative for abdominal pain and blood in stool.  Genitourinary: Negative for hematuria.       Objective:   Physical Exam  Constitutional: He is oriented to person, place, and time. He appears well-developed and well-nourished. No distress.  HENT:  Head: Normocephalic and atraumatic.  Neck: No thyromegaly present.       Nl carotid pulse  Cardiovascular: Normal rate, regular rhythm and normal heart sounds.   No murmur heard. Pulmonary/Chest: Effort normal and breath sounds normal. No respiratory distress. He has no wheezes. He has no rales.  Abdominal: Soft. He exhibits no distension. There is no tenderness. There is no rebound and no guarding.       Supraumbilical midline hernia, reducible  Musculoskeletal: Edema: trace edema.  Neurological: He is alert and oriented to person, place, and time.  Skin: Skin is warm and dry. He is not diaphoretic.  Psychiatric: He has a normal mood and affect. His behavior is normal. Judgment and thought content normal.      Assessment & Plan:

## 2011-06-21 NOTE — Assessment & Plan Note (Signed)
Great improvement w/ flomax, RF

## 2011-06-24 ENCOUNTER — Encounter: Payer: Self-pay | Admitting: *Deleted

## 2011-06-24 ENCOUNTER — Encounter: Payer: Self-pay | Admitting: Internal Medicine

## 2011-06-25 MED ORDER — SITAGLIPTIN PHOSPHATE 100 MG PO TABS: 100.0000 mg | ORAL_TABLET | Freq: Every day | ORAL | Status: DC

## 2011-06-25 NOTE — Progress Notes (Signed)
Addended by: Edwena Felty T on: 06/25/2011 03:50 PM   Modules accepted: Orders

## 2011-06-27 ENCOUNTER — Telehealth: Payer: Self-pay | Admitting: Internal Medicine

## 2011-06-27 NOTE — Telephone Encounter (Signed)
Advise patient: Cholesterol is well controlled Hemoglobin A1c is again slightly elevated. Continue with same medicines and add januvia 100 mg 1 po qd #30, 6 RF Followup as planned in 4 months

## 2011-06-28 ENCOUNTER — Encounter: Payer: Self-pay | Admitting: *Deleted

## 2011-07-12 ENCOUNTER — Ambulatory Visit (AMBULATORY_SURGERY_CENTER): Payer: Medicare Other

## 2011-07-12 VITALS — Ht 73.0 in | Wt 215.0 lb

## 2011-07-12 DIAGNOSIS — Z1211 Encounter for screening for malignant neoplasm of colon: Secondary | ICD-10-CM

## 2011-07-12 MED ORDER — PEG-KCL-NACL-NASULF-NA ASC-C 100 G PO SOLR: 1.0000 | Freq: Once | ORAL | Status: DC

## 2011-07-26 ENCOUNTER — Ambulatory Visit (AMBULATORY_SURGERY_CENTER): Payer: Medicare Other | Admitting: Internal Medicine

## 2011-07-26 ENCOUNTER — Encounter: Payer: Self-pay | Admitting: Internal Medicine

## 2011-07-26 DIAGNOSIS — K635 Polyp of colon: Secondary | ICD-10-CM

## 2011-07-26 DIAGNOSIS — Z86010 Personal history of colonic polyps: Secondary | ICD-10-CM

## 2011-07-26 DIAGNOSIS — Z1211 Encounter for screening for malignant neoplasm of colon: Secondary | ICD-10-CM

## 2011-07-26 DIAGNOSIS — D126 Benign neoplasm of colon, unspecified: Secondary | ICD-10-CM

## 2011-07-26 MED ORDER — SODIUM CHLORIDE 0.9 % IV SOLN: 500.0000 mL | INTRAVENOUS | Status: DC

## 2011-07-26 NOTE — Patient Instructions (Signed)
YOU HAD AN ENDOSCOPIC PROCEDURE TODAY AT THE [facility]: Refer to the procedure report that was given to you for any specific questions about what was found during the examination.  If the procedure report does not answer your questions, please call your gastroenterologist to clarify.  If you requested that your care partner not be given the details of your procedure findings, then the procedure report has been included in a sealed envelope for you to review at your convenience later.  YOU SHOULD EXPECT: Some feelings of bloating in the abdomen. Passage of more gas than usual.  Walking can help get rid of the air that was put into your GI tract during the procedure and reduce the bloating. If you had a lower endoscopy (such as a colonoscopy or flexible sigmoidoscopy) you may notice spotting of blood in your stool or on the toilet paper. If you underwent a bowel prep for your procedure, then you may not have a normal bowel movement for a few days.  DIET: Your first meal following the procedure should be a light meal and then it is ok to progress to your normal diet.  A half-sandwich or bowl of soup is an example of a good first meal.  Heavy or fried foods are harder to digest and may make you feel nauseous or bloated.  Likewise meals heavy in dairy and vegetables can cause extra gas to form and this can also increase the bloating.  Drink plenty of fluids but you should avoid alcoholic beverages for 24 hours.  ACTIVITY: Your care partner should take you home directly after the procedure.  You should plan to take it easy, moving slowly for the rest of the day.  You can resume normal activity the day after the procedure however you should NOT DRIVE or use heavy machinery for 24 hours (because of the sedation medicines used during the test).    SYMPTOMS TO REPORT IMMEDIATELY: A gastroenterologist can be reached at any hour.  During normal business hours, 8:30 AM to 5:00 PM Monday through Friday,  call [phone].  After hours and on weekends, please call the GI answering service at [phone] who will take a message and have the physician on call contact you.   Following lower endoscopy (colonoscopy or flexible sigmoidoscopy):  Excessive amounts of blood in the stool  Significant tenderness or worsening of abdominal pains  Swelling of the abdomen that is new, acute  Fever of 100F or higher    FOLLOW UP: If any biopsies were taken you will be contacted by phone or by letter within the next 1-3 weeks.  Call your gastroenterologist if you have not heard about the biopsies in 3 weeks.  Our staff will call the home number listed on your records the next business day following your procedure to check on you and address any questions or concerns that you may have at that time regarding the information given to you following your procedure. This is a courtesy call and so if there is no answer at the home number and we have not heard from you through the emergency physician on call, we will assume that you have returned to your regular daily activities without incident.  SIGNATURES/CONFIDENTIALITY: You and/or your care partner have signed paperwork which will be entered into your electronic medical record.  These signatures attest to the fact that that the information above on your After Visit Summary has been reviewed and is understood.  Full responsibility of the confidentiality   of this discharge information lies with you and/or your care-partner.     

## 2011-07-26 NOTE — Progress Notes (Signed)
Patient did not experience any of the following events: a burn prior to discharge; a fall within the facility; wrong site/side/patient/procedure/implant event; or a hospital transfer or hospital admission upon discharge from the facility. (G8907) Patient did not have preoperative order for IV antibiotic SSI prophylaxis. (G8918)  

## 2011-07-26 NOTE — Op Note (Signed)
[facility] [address] [place], [place]  [address]  COLONOSCOPY PROCEDURE REPORT  PATIENT:  Chase, Martinez  MR#:  [id number] BIRTHDATE:  October 25, 1940, 70 yrs. old  GENDER:  male ENDOSCOPIST:  Carie Caddy. Lavonya Hoerner, MD REF. BY:  Willow Ora, M.D. PROCEDURE DATE:  07/26/2011 PROCEDURE:  Colon with cold biopsy polypectomy ASA CLASS:  Class II INDICATIONS:  Routine Risk Screening, last colonoscopy 11 years ago MEDICATIONS:   These medications were titrated to patient response per physician's verbal order, Versed 9 mg IV, Fentanyl 100 mcg IV  DESCRIPTION OF PROCEDURE:   After the risks benefits and alternatives of the procedure were thoroughly explained, informed consent was obtained.  Digital rectal exam was performed and revealed no rectal masses.   The LB [id number] endoscope was introduced through the anus and advanced to the cecum, which was identified by both the appendix and ileocecal valve, without limitations.  The quality of the prep was good, using MoviPrep. The instrument was then slowly withdrawn as the colon was fully examined. <<PROCEDUREIMAGES>> FINDINGS:  Moderate diverticulosis was found ascending colon to sigmoid colon.  A 3 mm sessile polyp was found in the rectum. The polyp was removed using cold biopsy forceps.   Retroflexed views in the rectum revealed no abnormalities.  The scope was then withdrawn from the cecum and the procedure completed. COMPLICATIONS:  None  ENDOSCOPIC IMPRESSION: 1) Moderate diverticulosis ascending colon to sigmoid colon 2) Sessile polyp in the rectum. Removed and sent to pathology.  RECOMMENDATIONS: 1) Await pathology results 2) High fiber diet. 3) If the polyp removed today is proven to be an adenomatous (pre-cancerous) polyp, you will need a repeat colonoscopy in 5 years. Otherwise you should continue to follow colorectal cancer screening guidelines for "routine risk" patients with colonoscopy in 10 years. You will  receive a letter within 1-2 weeks with the results of your biopsy as well as final recommendations. Please call my office if you have not received a letter after 3 weeks.  Carie Caddy. Rhea Belton, MD  CC:  The Patient Willow Ora, MD  n. eSIGNED:   Carie Caddy. Keyvon Herter at 07/26/2011 09:50 AM  Artelia Laroche, [phone]

## 2011-07-27 ENCOUNTER — Telehealth: Payer: Self-pay | Admitting: *Deleted

## 2011-07-27 NOTE — Telephone Encounter (Signed)
  Follow up Call-  Call back number 07/26/2011  Post procedure Call Back phone  # [phone] hm  Permission to leave phone message Yes     Patient questions:  Do you have a fever, pain , or abdominal swelling? no Pain Score  0 *  Have you tolerated food without any problems? yes  Have you been able to return to your normal activities? yes  Do you have any questions about your discharge instructions: Diet   no Medications  no Follow up visit  no  Do you have questions or concerns about your Care? no  Actions: * If pain score is 4 or above: No action needed, pain <4.

## 2011-07-30 ENCOUNTER — Encounter: Payer: Self-pay | Admitting: Internal Medicine

## 2011-08-04 ENCOUNTER — Other Ambulatory Visit: Payer: Self-pay | Admitting: Internal Medicine

## 2011-08-04 NOTE — Telephone Encounter (Signed)
Refill done.  

## 2011-08-05 NOTE — Telephone Encounter (Signed)
I don't think we ever called him, please let him know

## 2011-08-06 NOTE — Telephone Encounter (Signed)
Thank you :)

## 2011-08-06 NOTE — Telephone Encounter (Signed)
Spoke with pt, I sent him a letter about a month ago because I was unable to get in touch with him. He is currently taking the Januvia. He has an appt scheduled for 5.28.13.

## 2011-09-21 DIAGNOSIS — H2510 Age-related nuclear cataract, unspecified eye: Secondary | ICD-10-CM | POA: Diagnosis not present

## 2011-09-21 DIAGNOSIS — E119 Type 2 diabetes mellitus without complications: Secondary | ICD-10-CM | POA: Diagnosis not present

## 2011-09-21 DIAGNOSIS — H40019 Open angle with borderline findings, low risk, unspecified eye: Secondary | ICD-10-CM | POA: Diagnosis not present

## 2011-09-24 ENCOUNTER — Encounter: Payer: Self-pay | Admitting: *Deleted

## 2011-09-24 ENCOUNTER — Other Ambulatory Visit: Payer: Self-pay | Admitting: Internal Medicine

## 2011-09-24 NOTE — Telephone Encounter (Signed)
Refill done.  

## 2011-10-19 ENCOUNTER — Ambulatory Visit (INDEPENDENT_AMBULATORY_CARE_PROVIDER_SITE_OTHER): Payer: Medicare Other | Admitting: Internal Medicine

## 2011-10-19 ENCOUNTER — Encounter: Payer: Self-pay | Admitting: Internal Medicine

## 2011-10-19 VITALS — BP 128/80 | HR 56 | Temp 97.8°F | Wt 221.0 lb

## 2011-10-19 DIAGNOSIS — IMO0001 Reserved for inherently not codable concepts without codable children: Secondary | ICD-10-CM | POA: Diagnosis not present

## 2011-10-19 DIAGNOSIS — E119 Type 2 diabetes mellitus without complications: Secondary | ICD-10-CM

## 2011-10-19 DIAGNOSIS — I10 Essential (primary) hypertension: Secondary | ICD-10-CM

## 2011-10-19 LAB — BASIC METABOLIC PANEL
BUN: 18 mg/dL (ref 6–23)
CO2: 24 mEq/L (ref 19–32)
Chloride: 105 mEq/L (ref 96–112)
Creatinine, Ser: 0.8 mg/dL (ref 0.4–1.5)
Potassium: 4.5 mEq/L (ref 3.5–5.1)

## 2011-10-19 LAB — ALT: ALT: 24 U/L (ref 0–53)

## 2011-10-19 LAB — HEMOGLOBIN A1C: Hgb A1c MFr Bld: 6.6 % — ABNORMAL HIGH (ref 4.6–6.5)

## 2011-10-19 NOTE — Assessment & Plan Note (Signed)
Well-controlled,  check a BMP 

## 2011-10-19 NOTE — Assessment & Plan Note (Signed)
Started Januvia based on the last A1c, good compliance and tolerance. Check the A1c

## 2011-10-19 NOTE — Progress Notes (Signed)
  Subjective:    Patient ID: Chase Martinez, male    DOB: 13-Oct-1940, 71 y.o.   MRN: [id number]  HPI Routine office visit He is to Januvia based on the last A1c, no apparent side effects, cost is not an issue. Medication list reviewed, good compliance   Past Medical History: Diabetes mellitus, type II hyperlipidemia Hypertension BCC H/O PVCs    Past Surgical History: right shoulder surgery. pilonidal cyst Tonsillectomy   Family History: CAD - no HTN - M DM - no stroke - no colon Ca - no prostate Ca - no Dementia - F thyroid Ca - son Parkinson - sister glaucoma - M  Social History Retired Cabin crew Academic librarian) Married, 2 children, son lives in [place] Never Smoked Alcohol use-yes: 2 glasses of wine daily Drug use-no Regular exercise-yes: plays golf 3 times per week    Review of Systems No nausea, vomiting, diarrhea. Blood sugars on average since he started Januvia - 118 Ambulatory BPs around 127/74.    Objective:   Physical Exam  General -- alert, well-developed. No apparent distress.  Lungs -- normal respiratory effort, no intercostal retractions, no accessory muscle use, and normal breath sounds.   Heart-- normal rate, regular rhythm, no murmur, and no gallop.   DIABETIC FEET EXAM: No lower extremity edema Normal pedal pulses bilaterally Skin and nails are normal , large bunion deformities bilaterally. Pinprick examination of the feet normal. Psych-- Cognition and judgment appear intact. Alert and cooperative with normal attention span and concentration.  not anxious appearing and not depressed appearing.       Assessment & Plan:

## 2011-10-25 ENCOUNTER — Other Ambulatory Visit: Payer: Self-pay | Admitting: Internal Medicine

## 2011-10-25 NOTE — Telephone Encounter (Signed)
Refill done.  

## 2011-11-02 ENCOUNTER — Other Ambulatory Visit: Payer: Self-pay | Admitting: Dermatology

## 2011-11-02 DIAGNOSIS — D485 Neoplasm of uncertain behavior of skin: Secondary | ICD-10-CM | POA: Diagnosis not present

## 2011-11-02 DIAGNOSIS — D239 Other benign neoplasm of skin, unspecified: Secondary | ICD-10-CM | POA: Diagnosis not present

## 2011-11-02 DIAGNOSIS — L821 Other seborrheic keratosis: Secondary | ICD-10-CM | POA: Diagnosis not present

## 2011-11-02 DIAGNOSIS — L570 Actinic keratosis: Secondary | ICD-10-CM | POA: Diagnosis not present

## 2011-11-24 DIAGNOSIS — C4499 Other specified malignant neoplasm of skin, unspecified: Secondary | ICD-10-CM | POA: Diagnosis not present

## 2011-11-28 ENCOUNTER — Other Ambulatory Visit: Payer: Self-pay | Admitting: Internal Medicine

## 2011-11-29 NOTE — Telephone Encounter (Signed)
Refill done.  

## 2011-12-09 DIAGNOSIS — L570 Actinic keratosis: Secondary | ICD-10-CM | POA: Diagnosis not present

## 2012-02-22 ENCOUNTER — Encounter: Payer: Self-pay | Admitting: Internal Medicine

## 2012-02-22 ENCOUNTER — Ambulatory Visit (INDEPENDENT_AMBULATORY_CARE_PROVIDER_SITE_OTHER): Payer: Medicare Other | Admitting: Internal Medicine

## 2012-02-22 VITALS — BP 134/82 | HR 61 | Temp 97.7°F | Wt 223.0 lb

## 2012-02-22 DIAGNOSIS — E119 Type 2 diabetes mellitus without complications: Secondary | ICD-10-CM | POA: Diagnosis not present

## 2012-02-22 DIAGNOSIS — K439 Ventral hernia without obstruction or gangrene: Secondary | ICD-10-CM | POA: Diagnosis not present

## 2012-02-22 DIAGNOSIS — Z23 Encounter for immunization: Secondary | ICD-10-CM

## 2012-02-22 DIAGNOSIS — E785 Hyperlipidemia, unspecified: Secondary | ICD-10-CM | POA: Diagnosis not present

## 2012-02-22 LAB — LIPID PANEL
HDL: 35.2 mg/dL — ABNORMAL LOW (ref >39.00)
LDL Cholesterol: 58 mg/dL (ref 0–99)
VLDL: 15.6 mg/dL (ref 0.0–40.0)

## 2012-02-22 LAB — HEMOGLOBIN A1C: Hgb A1c MFr Bld: 6.4 % (ref 4.6–6.5)

## 2012-02-22 NOTE — Assessment & Plan Note (Signed)
Doing great, check the A1c, continue with same medicines.

## 2012-02-22 NOTE — Assessment & Plan Note (Signed)
Midline, supraumbilical hernia, reducible. Recommend observation, signs of incarceration discussed

## 2012-02-22 NOTE — Assessment & Plan Note (Signed)
Good medication compliance, check FLP.

## 2012-02-22 NOTE — Progress Notes (Signed)
  Subjective:    Patient ID: Chase Martinez, male    DOB: January 05, 1941, 71 y.o.   MRN: [id number]  HPI Routine office visit Diabetes, good medication compliance, on average CBGs are 120 in the morning Hypertension, good medication compliance, checks BPs around once a week, average 120/70s  Past Medical History: Diabetes mellitus, type II hyperlipidemia Hypertension BCC H/O PVCs    Past Surgical History: right shoulder surgery. pilonidal cyst Tonsillectomy   Family History: CAD - no HTN - M DM - no stroke - no colon Ca - no prostate Ca - no Dementia - F thyroid Ca - son Parkinson - sister glaucoma - M  Social History Retired Artist) Married, 2 children, son lives in [place] Never Smoked Alcohol use-yes: 2 glasses of wine daily Drug use-no Regular exercise-yes: plays golf 3 times per week     Review of Systems No chest pain or shortness or breath Occasionally has loose stools for the last 2 months, denies nausea, vomiting, weight loss. No blood in the stools. No abdominal pain.    Objective:   Physical Exam  Constitutional: He is oriented to person, place, and time. He appears well-developed and well-nourished. No distress.  HENT:  Head: Normocephalic and atraumatic.  Cardiovascular: Normal rate, regular rhythm and normal heart sounds.   No murmur heard. Pulmonary/Chest: Effort normal and breath sounds normal. No respiratory distress. He has no wheezes. He has no rales.  Abdominal:    Musculoskeletal:       DIABETIC FEET EXAM: No lower extremity edema Normal pedal pulses bilaterally Skin and nails are normal without calluses Pinprick examination of the feet normal.   Neurological: He is alert and oriented to person, place, and time.  Skin: He is not diaphoretic.  Psychiatric: He has a normal mood and affect. His behavior is normal. Judgment and thought content normal.       Assessment & Plan:  Flu shot today

## 2012-02-22 NOTE — Patient Instructions (Addendum)
Please come back in 5-6 months for a complete physical exam. You're doing great.

## 2012-03-20 DIAGNOSIS — H40019 Open angle with borderline findings, low risk, unspecified eye: Secondary | ICD-10-CM | POA: Diagnosis not present

## 2012-03-20 DIAGNOSIS — E119 Type 2 diabetes mellitus without complications: Secondary | ICD-10-CM | POA: Diagnosis not present

## 2012-04-04 DIAGNOSIS — I10 Essential (primary) hypertension: Secondary | ICD-10-CM | POA: Diagnosis not present

## 2012-04-04 DIAGNOSIS — I491 Atrial premature depolarization: Secondary | ICD-10-CM | POA: Diagnosis not present

## 2012-05-13 DIAGNOSIS — L255 Unspecified contact dermatitis due to plants, except food: Secondary | ICD-10-CM | POA: Diagnosis not present

## 2012-05-19 DIAGNOSIS — I8310 Varicose veins of unspecified lower extremity with inflammation: Secondary | ICD-10-CM | POA: Diagnosis not present

## 2012-05-30 ENCOUNTER — Encounter: Payer: Self-pay | Admitting: Internal Medicine

## 2012-05-30 ENCOUNTER — Ambulatory Visit (INDEPENDENT_AMBULATORY_CARE_PROVIDER_SITE_OTHER): Payer: Medicare Other | Admitting: Internal Medicine

## 2012-05-30 VITALS — BP 126/80 | HR 64 | Temp 98.1°F | Wt 224.0 lb

## 2012-05-30 DIAGNOSIS — L259 Unspecified contact dermatitis, unspecified cause: Secondary | ICD-10-CM

## 2012-05-30 DIAGNOSIS — L309 Dermatitis, unspecified: Secondary | ICD-10-CM

## 2012-05-30 DIAGNOSIS — I872 Venous insufficiency (chronic) (peripheral): Secondary | ICD-10-CM | POA: Insufficient documentation

## 2012-05-30 MED ORDER — FUROSEMIDE 20 MG PO TABS: 20.0000 mg | ORAL_TABLET | Freq: Every day | ORAL | Status: DC

## 2012-05-30 MED ORDER — DOXYCYCLINE HYCLATE 100 MG PO TABS: 100.0000 mg | ORAL_TABLET | Freq: Two times a day (BID) | ORAL | Status: DC

## 2012-05-30 NOTE — Assessment & Plan Note (Addendum)
Pretibial dermatitis bilaterally, likely a combination of edema and dry skin. Has few excoriations in the right pretibial area, there area  is slightly warm: early cellulitis?. Plan: diuretics for 2-3 weeks Moustarizing Continue with compression stockings Doxycycline See instructions

## 2012-05-30 NOTE — Progress Notes (Signed)
  Subjective:    Patient ID: Chase Martinez, male    DOB: 09/02/1940, 72 y.o.   MRN: [id number]  HPI Acute visit By mid-December, he developed dry pretibial skin bilaterally, some swelling and itching. [facility], was prescribed topical steroids. On 05/19/2012 went to see Dr. Yetta Barre, a dermatologist, he recommended compression stockings for edema. In looking back, the patient realizes that he  increased his salt intake and has already taken steps to minimize salt intake. Since he started using the compression stockings the swelling has decreased but he still itching. Benadryl helped a little.   Past Medical History  Diagnosis Date  . Diabetes mellitus   . Hyperlipidemia   . Hypertension   . Basal cell carcinoma   . Premature ventricular contractions    Past Surgical History  Procedure Date  . Shoulder surgery     right  . Pilonidal cyst excision   . Tonsillectomy     Review of Systems Denies chest pain or shortness of breath No recent airplane trip or prolonged car trip No fever chills Ambulatory blood sugars around 128.     Objective:   Physical Exam General -- alert, well-developed, and well-nourished.   Extremities-- +/+++ pretibial edema bilaterally, left calf is about half centimeter larger than right but not tender. Good pedal pulses bilaterally. Skin at both pretibial areas is very dry, hyperpigmented, not scaly. There is no blisters. At the right pretibial area he has a few scratches, area around the scratches the skin is slightly warm. Neurologic-- alert & oriented X3 and strength normal in all extremities. Psych-- Cognition and judgment appear intact. Alert and cooperative with normal attention span and concentration.  not anxious appearing and not depressed appearing.      Assessment & Plan:

## 2012-05-30 NOTE — Patient Instructions (Addendum)
Take furosemide, a diuretic, once in the morning for 2 or 3 weeks. Doxycycline, an antibiotic, for one week Leg elevation one hour twice a day Compression stockings Aveeno lotion Low-salt diet Call if not better in few days, call anytime if you get worse

## 2012-06-04 ENCOUNTER — Other Ambulatory Visit: Payer: Self-pay | Admitting: Internal Medicine

## 2012-06-05 ENCOUNTER — Other Ambulatory Visit: Payer: Self-pay | Admitting: Internal Medicine

## 2012-06-05 NOTE — Telephone Encounter (Signed)
Refill done.  

## 2012-06-14 ENCOUNTER — Other Ambulatory Visit: Payer: Self-pay | Admitting: Internal Medicine

## 2012-06-14 NOTE — Telephone Encounter (Signed)
Refill done.  Pt has future appt scheduled 3.13.14.

## 2012-06-15 ENCOUNTER — Other Ambulatory Visit: Payer: Self-pay | Admitting: Dermatology

## 2012-06-15 DIAGNOSIS — D485 Neoplasm of uncertain behavior of skin: Secondary | ICD-10-CM | POA: Diagnosis not present

## 2012-06-15 DIAGNOSIS — D235 Other benign neoplasm of skin of trunk: Secondary | ICD-10-CM | POA: Diagnosis not present

## 2012-06-15 DIAGNOSIS — Z85828 Personal history of other malignant neoplasm of skin: Secondary | ICD-10-CM | POA: Diagnosis not present

## 2012-06-15 DIAGNOSIS — C44211 Basal cell carcinoma of skin of unspecified ear and external auricular canal: Secondary | ICD-10-CM | POA: Diagnosis not present

## 2012-06-15 DIAGNOSIS — L570 Actinic keratosis: Secondary | ICD-10-CM | POA: Diagnosis not present

## 2012-06-29 DIAGNOSIS — C44211 Basal cell carcinoma of skin of unspecified ear and external auricular canal: Secondary | ICD-10-CM | POA: Diagnosis not present

## 2012-07-10 ENCOUNTER — Encounter: Payer: Self-pay | Admitting: Internal Medicine

## 2012-07-10 ENCOUNTER — Ambulatory Visit (INDEPENDENT_AMBULATORY_CARE_PROVIDER_SITE_OTHER): Payer: Medicare Other | Admitting: Internal Medicine

## 2012-07-10 ENCOUNTER — Other Ambulatory Visit: Payer: Self-pay | Admitting: *Deleted

## 2012-07-10 VITALS — BP 120/80 | HR 71 | Temp 97.6°F | Wt 227.0 lb

## 2012-07-10 DIAGNOSIS — L259 Unspecified contact dermatitis, unspecified cause: Secondary | ICD-10-CM | POA: Diagnosis not present

## 2012-07-10 DIAGNOSIS — L309 Dermatitis, unspecified: Secondary | ICD-10-CM

## 2012-07-10 MED ORDER — FUROSEMIDE 20 MG PO TABS: 20.0000 mg | ORAL_TABLET | Freq: Every day | ORAL | Status: DC

## 2012-07-10 MED ORDER — HYDROCORTISONE 2.5 % EX CREA: TOPICAL_CREAM | Freq: Two times a day (BID) | CUTANEOUS | Status: DC

## 2012-07-10 NOTE — Assessment & Plan Note (Signed)
He did very well after the last office visit, few days ago symptoms came back mostly edema and severe itching. I think symptoms are related to skin stretching due to edema. Plan: Restart Lasix permanently, see instructions

## 2012-07-10 NOTE — Patient Instructions (Addendum)
Take furosemide, a diuretic, once daily. Leg elevation one hour twice a day Compression stockings Hydrocortisone  Low-salt diet  Please come back in 2 or 3 weeks for blood work only, BMP dx edema

## 2012-07-10 NOTE — Progress Notes (Signed)
  Subjective:    Patient ID: Chase Martinez, male    DOB: Apr 05, 1941, 72 y.o.   MRN: [id number]  HPI Acute visit See previous visit, he was doing well with edema, took Lasix for 3 weeks. Suddenly, last week he developed again peri-ankle edema, severe itching in the skin around the area.   Past Medical History  Diagnosis Date  . Diabetes mellitus   . Hyperlipidemia   . Hypertension   . Basal cell carcinoma   . Premature ventricular contractions    Past Surgical History  Procedure Laterality Date  . Shoulder surgery      right  . Pilonidal cyst excision    . Tonsillectomy        Review of Systems No fever or chills He follows a low-salt diet. Denies any chest pain, shortness of breath, orthopnea. Diabetes, CBG  on average 126 in the morning.    Objective:   Physical Exam General -- alert, well-developed Extremities--  +/+++ pretibial edema bilaterally, symmetric. Good pedal pulses bilaterally.  Skin at both pretibial areas-- not dry, slt hyperpigmented. There is no blisters. Has a few scratches bilaterally, area around the scratches the skin is slightly warm , no discharge or odor. Psych-- Cognition and judgment appear intact. Alert and cooperative with normal attention span and concentration.  not anxious appearing and not depressed appearing.      Assessment & Plan:

## 2012-07-27 ENCOUNTER — Other Ambulatory Visit (INDEPENDENT_AMBULATORY_CARE_PROVIDER_SITE_OTHER): Payer: Medicare Other

## 2012-07-27 DIAGNOSIS — R609 Edema, unspecified: Secondary | ICD-10-CM | POA: Diagnosis not present

## 2012-07-27 LAB — BASIC METABOLIC PANEL
BUN: 16 mg/dL (ref 6–23)
Chloride: 100 mEq/L (ref 96–112)
Glucose, Bld: 124 mg/dL — ABNORMAL HIGH (ref 70–99)
Potassium: 4.1 mEq/L (ref 3.5–5.1)

## 2012-08-01 ENCOUNTER — Other Ambulatory Visit: Payer: Self-pay | Admitting: Internal Medicine

## 2012-08-01 DIAGNOSIS — E119 Type 2 diabetes mellitus without complications: Secondary | ICD-10-CM

## 2012-08-01 NOTE — Telephone Encounter (Signed)
Refill for metformin sent to [facility]

## 2012-08-03 ENCOUNTER — Ambulatory Visit (INDEPENDENT_AMBULATORY_CARE_PROVIDER_SITE_OTHER): Payer: Medicare Other | Admitting: Internal Medicine

## 2012-08-03 ENCOUNTER — Encounter: Payer: Self-pay | Admitting: Internal Medicine

## 2012-08-03 VITALS — BP 116/68 | HR 64 | Temp 97.3°F | Ht 72.0 in | Wt 227.0 lb

## 2012-08-03 DIAGNOSIS — E119 Type 2 diabetes mellitus without complications: Secondary | ICD-10-CM

## 2012-08-03 DIAGNOSIS — E785 Hyperlipidemia, unspecified: Secondary | ICD-10-CM | POA: Diagnosis not present

## 2012-08-03 DIAGNOSIS — N400 Enlarged prostate without lower urinary tract symptoms: Secondary | ICD-10-CM

## 2012-08-03 DIAGNOSIS — I10 Essential (primary) hypertension: Secondary | ICD-10-CM

## 2012-08-03 DIAGNOSIS — Z0000 Encounter for general adult medical examination without abnormal findings: Secondary | ICD-10-CM | POA: Diagnosis not present

## 2012-08-03 LAB — CBC WITH DIFFERENTIAL/PLATELET
Basophils Absolute: 0 10*3/uL (ref 0.0–0.1)
HCT: 38.3 % — ABNORMAL LOW (ref 39.0–52.0)
Lymphs Abs: 1.4 10*3/uL (ref 0.7–4.0)
MCHC: 33.8 g/dL (ref 30.0–36.0)
MCV: 93.7 fl (ref 78.0–100.0)
Monocytes Absolute: 0.5 10*3/uL (ref 0.1–1.0)
Platelets: 259 10*3/uL (ref 150.0–400.0)
RDW: 13.2 % (ref 11.5–14.6)

## 2012-08-03 LAB — TSH: TSH: 0.99 u[IU]/mL (ref 0.35–5.50)

## 2012-08-03 LAB — BASIC METABOLIC PANEL
CO2: 25 mEq/L (ref 19–32)
Calcium: 8.8 mg/dL (ref 8.4–10.5)
Glucose, Bld: 132 mg/dL — ABNORMAL HIGH (ref 70–99)

## 2012-08-03 NOTE — Assessment & Plan Note (Addendum)
Tdap 2013 Pneumonia shot 5-09 had shingles shot 2009 aprox PSAs stable over time , DRE today normal Cscope--  Dr Luther Parody in 2002 ,another Cscope 3-13, 1 polyp, next 5 years Diet-exercise discussed

## 2012-08-03 NOTE — Assessment & Plan Note (Signed)
Very well controlled, no change.

## 2012-08-03 NOTE — Progress Notes (Signed)
Subjective:    Patient ID: Chase Martinez, male    DOB: 07/08/1940, 72 y.o.   MRN: [id number]  HPI  Here for Medicare AWV: 1. Risk factors based on Past M, S, F history: reviewed 2. Physical Activities: still plays golf , active  3. Depression/mood: No problems noted or reported   4. Hearing:  No problemss noted or reported   5. ADL's:  Totally independent , still drives   6. Fall Risk: no recent falls, see instructions  7. home Safety: does feel safe at home   8. Height, weight, &visual acuity: see VS, vision corrected w/glasses, sees eye doctor regulalrly 9. Counseling: provided 10. Labs ordered based on risk factors: if needed   11. Referral Coordination: if needed 12.  Care Plan, see assessment and plan   13.   Cognitive Assessment: Cognition  and motor appropriate  In addition, today we discussed the following: Diabetes, good medication compliance, ambulatory CBGs average 130 in the morning Hypertension, good medication compliance, BPs usually 130/60, less commonly 140/70 High cholesterol, good medication compliance. Taking Flomax daily, good results.  Past Medical History  Diagnosis Date  . Diabetes mellitus   . Hyperlipidemia   . Hypertension   . Basal cell carcinoma   . Premature ventricular contractions     Past Surgical History  Procedure Laterality Date  . Shoulder surgery      right  . Pilonidal cyst excision    . Tonsillectomy     Family History  Problem Relation Age of Onset  . Coronary artery disease Neg Hx   . Diabetes Neg Hx   . Stroke Neg Hx   . Prostate cancer Neg Hx   . Colon cancer Neg Hx   . Esophageal cancer Neg Hx   . Stomach cancer Neg Hx   . Hypertension Mother   . Glaucoma Mother   . Dementia Father   . Thyroid cancer Son   . Parkinsonism Sister     History   Social History  . Marital Status: Married    Spouse Name: N/A    Number of Children: 2  . Years of Education: N/A   Occupational History  . Retired Cabin crew Academic librarian)     Social History Main Topics  . Smoking status: Former Smoker    Quit date: 05/24/1966  . Smokeless tobacco: Former Neurosurgeon    Types: Snuff    Quit date: 05/24/1988  . Alcohol Use: 0.0 oz/week     Comment: 2 glasses of wine daily  . Drug Use: No  . Sexually Active: Not on file   Other Topics Concern  . Not on file   Social History Narrative   Regular exercise- yes plays golf 3 times per week   Diet- eats healthy          Review of Systems No chest pain or shortness or breath No nausea, vomiting, diarrhea or blood in the stools. No dysuria or gross hematuria.     Objective:   Physical Exam General -- alert, well-developed  Neck --no thyromegaly , normal carotid pulse Lungs -- normal respiratory effort, no intercostal retractions, no accessory muscle use, and normal breath sounds.   Heart-- normal rate, regular rhythm, no murmur, and no gallop.   Abdomen--soft, non-tender, no distention, no masses, no HSM, no guarding, and no rigidity.   Extremities-- no pretibial edema bilaterally, normal femoral pulses DIABETIC FEET EXAM: No lower extremity edema  Skin normal and nails thick, not calluses  Pinprick examination of the feet  normal. Rectal-- No external abnormalities noted. Normal sphincter tone. No rectal masses or tenderness. Brown stool  Prostate:  Prostate gland firm and smooth, no enlargement, nodularity, tenderness, mass, asymmetry or induration. Neurologic-- alert & oriented X3 and strength normal in all extremities. Psych-- Cognition and judgment appear intact. Alert and cooperative with normal attention span and concentration.  not anxious appearing and not depressed appearing.       Assessment & Plan:

## 2012-08-03 NOTE — Assessment & Plan Note (Signed)
Symptoms well-controlled with Flomax, DRE normal

## 2012-08-03 NOTE — Patient Instructions (Addendum)

## 2012-08-03 NOTE — Assessment & Plan Note (Signed)
Seems to be under excellent control, labs.

## 2012-08-03 NOTE — Assessment & Plan Note (Signed)
Well-controlled per last cholesterol panel, no change 

## 2012-08-07 ENCOUNTER — Telehealth: Payer: Self-pay | Admitting: Internal Medicine

## 2012-08-07 MED ORDER — TAMSULOSIN HCL 0.4 MG PO CAPS: ORAL_CAPSULE | ORAL | Status: DC

## 2012-08-07 MED ORDER — FREESTYLE LANCETS MISC: Status: DC

## 2012-08-07 MED ORDER — GLUCOSE BLOOD VI STRP: ORAL_STRIP | Status: DC

## 2012-08-07 MED ORDER — SITAGLIPTIN PHOSPHATE 100 MG PO TABS: ORAL_TABLET | ORAL | Status: DC

## 2012-08-07 NOTE — Telephone Encounter (Signed)
Patient is requesting a refill of lancets, freestyle test strips, januvia, and tamsulosin to be sent to [facility]

## 2012-08-07 NOTE — Telephone Encounter (Signed)
Rx sent 

## 2012-08-09 ENCOUNTER — Telehealth: Payer: Self-pay | Admitting: *Deleted

## 2012-08-09 MED ORDER — GLUCOSE BLOOD VI STRP: ORAL_STRIP | Status: DC

## 2012-08-09 MED ORDER — FREESTYLE LANCETS MISC: Status: DC

## 2012-08-09 MED ORDER — TAMSULOSIN HCL 0.4 MG PO CAPS: ORAL_CAPSULE | ORAL | Status: DC

## 2012-08-09 MED ORDER — SITAGLIPTIN PHOSPHATE 100 MG PO TABS: ORAL_TABLET | ORAL | Status: DC

## 2012-08-09 NOTE — Telephone Encounter (Signed)
Refill request for: Freestyle test strips Freestyle lancets januvia 100mg  tamsulosin 0.4mg   [facility] not receive previous refills. Re-sent rx.

## 2012-08-24 ENCOUNTER — Other Ambulatory Visit: Payer: Self-pay | Admitting: Internal Medicine

## 2012-08-24 NOTE — Telephone Encounter (Signed)
Refill done.  

## 2012-09-18 ENCOUNTER — Other Ambulatory Visit: Payer: Self-pay | Admitting: Internal Medicine

## 2012-09-18 NOTE — Telephone Encounter (Signed)
Refill done.  

## 2012-10-02 DIAGNOSIS — M25519 Pain in unspecified shoulder: Secondary | ICD-10-CM | POA: Diagnosis not present

## 2012-11-14 ENCOUNTER — Encounter: Payer: Self-pay | Admitting: *Deleted

## 2012-11-14 DIAGNOSIS — E119 Type 2 diabetes mellitus without complications: Secondary | ICD-10-CM | POA: Diagnosis not present

## 2012-11-14 DIAGNOSIS — H40019 Open angle with borderline findings, low risk, unspecified eye: Secondary | ICD-10-CM | POA: Diagnosis not present

## 2012-11-14 LAB — HM DIABETES EYE EXAM

## 2012-12-11 DIAGNOSIS — Z85828 Personal history of other malignant neoplasm of skin: Secondary | ICD-10-CM | POA: Diagnosis not present

## 2012-12-11 DIAGNOSIS — L570 Actinic keratosis: Secondary | ICD-10-CM | POA: Diagnosis not present

## 2012-12-11 DIAGNOSIS — D485 Neoplasm of uncertain behavior of skin: Secondary | ICD-10-CM | POA: Diagnosis not present

## 2012-12-11 DIAGNOSIS — T148 Other injury of unspecified body region: Secondary | ICD-10-CM | POA: Diagnosis not present

## 2012-12-11 DIAGNOSIS — W57XXXA Bitten or stung by nonvenomous insect and other nonvenomous arthropods, initial encounter: Secondary | ICD-10-CM | POA: Diagnosis not present

## 2013-01-04 ENCOUNTER — Other Ambulatory Visit: Payer: Self-pay | Admitting: Internal Medicine

## 2013-01-04 NOTE — Telephone Encounter (Signed)
Refill done per protocol.  

## 2013-01-07 ENCOUNTER — Other Ambulatory Visit: Payer: Self-pay | Admitting: Internal Medicine

## 2013-01-08 NOTE — Telephone Encounter (Signed)
Please advise if ok to give 90 day supply of Flomax (uses mail order), no PSA on file this year per protocol but does have pending apt. 02/20/13.

## 2013-01-08 NOTE — Telephone Encounter (Signed)
PSA is stable for several years. Okay to RF flomax x 1 year

## 2013-01-09 NOTE — Telephone Encounter (Signed)
Refill done per orders.  

## 2013-02-20 ENCOUNTER — Encounter: Payer: Self-pay | Admitting: Internal Medicine

## 2013-02-20 ENCOUNTER — Ambulatory Visit (INDEPENDENT_AMBULATORY_CARE_PROVIDER_SITE_OTHER): Payer: Medicare Other | Admitting: Internal Medicine

## 2013-02-20 VITALS — BP 136/78 | HR 63 | Temp 97.4°F | Wt 222.8 lb

## 2013-02-20 DIAGNOSIS — E785 Hyperlipidemia, unspecified: Secondary | ICD-10-CM

## 2013-02-20 DIAGNOSIS — E119 Type 2 diabetes mellitus without complications: Secondary | ICD-10-CM

## 2013-02-20 DIAGNOSIS — I10 Essential (primary) hypertension: Secondary | ICD-10-CM | POA: Diagnosis not present

## 2013-02-20 LAB — LIPID PANEL
Cholesterol: 136 mg/dL (ref 0–200)
HDL: 34.8 mg/dL — ABNORMAL LOW (ref >39.00)
LDL Cholesterol: 75 mg/dL (ref 0–99)
Total CHOL/HDL Ratio: 4
Triglycerides: 131 mg/dL (ref 0.0–149.0)

## 2013-02-20 LAB — MICROALBUMIN / CREATININE URINE RATIO: Microalb Creat Ratio: 0.1 mg/g (ref 0.0–30.0)

## 2013-02-20 LAB — HEMOGLOBIN A1C: Hgb A1c MFr Bld: 6.8 % — ABNORMAL HIGH (ref 4.6–6.5)

## 2013-02-20 LAB — BASIC METABOLIC PANEL
BUN: 14 mg/dL (ref 6–23)
CO2: 25 mEq/L (ref 19–32)
Chloride: 100 mEq/L (ref 96–112)
Potassium: 4.3 mEq/L (ref 3.5–5.1)
Sodium: 135 mEq/L (ref 135–145)

## 2013-02-20 NOTE — Patient Instructions (Signed)
Get your blood work before you leave  Next visit in 4-5 months  for a check up Get a high dose flu shot either at your pharmacy or here in 2 or 3 weeks!

## 2013-02-20 NOTE — Assessment & Plan Note (Signed)
Seems well controlled, check a A1C

## 2013-02-20 NOTE — Progress Notes (Signed)
  Subjective:    Patient ID: Chase Martinez, male    DOB: 1940-07-12, 72 y.o.   MRN: [id number]  HPI Routine office visit Diabetes, good medication compliance with meds, but sugars usually around 140 in the morning, no low blood sugar symptoms High cholesterol, good compliance with Lipitor. Hypertension, good compliance with medications including Lasix, edema has decrease, ambulatory BP usually 135.  Past Medical History  Diagnosis Date  . Diabetes mellitus   . Hyperlipidemia   . Hypertension   . Basal cell carcinoma   . Premature ventricular contractions    Past Surgical History  Procedure Laterality Date  . Shoulder surgery      right  . Pilonidal cyst excision    . Tonsillectomy     History   Social History  . Marital Status: Married    Spouse Name: N/A    Number of Children: 2  . Years of Education: N/A   Occupational History  . Retired Cabin crew Academic librarian)    Social History Main Topics  . Smoking status: Former Smoker    Quit date: 05/24/1966  . Smokeless tobacco: Former Neurosurgeon    Types: Snuff    Quit date: 05/24/1988  . Alcohol Use: 0.0 oz/week     Comment: 2 glasses of wine daily  . Drug Use: No  . Sexual Activity: Not on file   Other Topics Concern  . Not on file   Social History Narrative                Review of Systems Diet-- healthy Exercise-- golf, walks x 3/week No  CP, SOB, lower extremity edema Denies  nausea, vomiting diarrhea Denies  blood in the stools  (-) cough, sputum production GU sx well controlled on flomax      Objective:   Physical Exam BP 136/78  Pulse 63  Temp(Src) 97.4 F (36.3 C)  Wt 222 lb 12.8 oz (101.061 kg)  BMI 30.21 kg/m2  SpO2 97% General -- alert, well-developed, NAD.  Lungs -- normal respiratory effort, no intercostal retractions, no accessory muscle use, and normal breath sounds.  Heart-- normal rate, regular rhythm, no murmur.  Extremities-- no pretibial edema bilaterally  Neurologic--  alert & oriented  X3. Speech normal, gait normal, strength normal in all extremities.   Psych-- Cognition and judgment appear intact. Cooperative with normal attention span and concentration. No anxious appearing , no depressed appearing.     Assessment & Plan:

## 2013-02-20 NOTE — Assessment & Plan Note (Signed)
Due for labs

## 2013-02-20 NOTE — Assessment & Plan Note (Signed)
Well controlled, continue with medications, check a BMP

## 2013-03-29 ENCOUNTER — Other Ambulatory Visit: Payer: Self-pay

## 2013-04-04 ENCOUNTER — Ambulatory Visit (INDEPENDENT_AMBULATORY_CARE_PROVIDER_SITE_OTHER): Payer: Medicare Other

## 2013-04-04 DIAGNOSIS — Z23 Encounter for immunization: Secondary | ICD-10-CM

## 2013-04-08 ENCOUNTER — Encounter: Payer: Self-pay | Admitting: Cardiology

## 2013-04-08 DIAGNOSIS — I491 Atrial premature depolarization: Secondary | ICD-10-CM | POA: Insufficient documentation

## 2013-04-09 ENCOUNTER — Encounter: Payer: Self-pay | Admitting: Cardiology

## 2013-04-09 ENCOUNTER — Ambulatory Visit (INDEPENDENT_AMBULATORY_CARE_PROVIDER_SITE_OTHER): Payer: Medicare Other | Admitting: Cardiology

## 2013-04-09 VITALS — BP 130/72 | HR 66 | Ht 72.0 in | Wt 223.0 lb

## 2013-04-09 DIAGNOSIS — I491 Atrial premature depolarization: Secondary | ICD-10-CM

## 2013-04-09 DIAGNOSIS — I10 Essential (primary) hypertension: Secondary | ICD-10-CM

## 2013-04-09 NOTE — Progress Notes (Addendum)
[address] [place], [place]  [address] Phone: [phone] Fax:  [phone]  Date:  04/12/2013   ID:  Chase Martinez, DOB [birth year], MRN [id number]  PCP:  Willow Ora, MD  Cardiologist:  Armanda Magic, MD     History of Present Illness: Chase Martinez is a 72 y.o. male with a history of PAC's and HTN who presents today for followup.  He is doing well.  He denies any chest pain, SOB, DOE, LE edema, dizziness  or syncope. Occasionally he will notice a skipped heart beat.   Wt Readings from Last 3 Encounters:  04/09/13 223 lb (101.152 kg)  02/20/13 222 lb 12.8 oz (101.061 kg)  08/03/12 227 lb (102.967 kg)     Past Medical History  Diagnosis Date  . Diabetes mellitus   . Basal cell carcinoma   . Premature atrial contractions   . Hypertension   . Hyperlipidemia     Current Outpatient Prescriptions  Medication Sig Dispense Refill  . aspirin 81 MG tablet Take 81 mg by mouth daily.        Marland Kitchen atorvastatin (LIPITOR) 20 MG tablet TAKE 1 TABLET DAILY  90 tablet  2  . diltiazem (CARDIZEM CD) 240 MG 24 hr capsule Take 240 mg by mouth daily.        . furosemide (LASIX) 20 MG tablet Take 1 tablet (20 mg total) by mouth daily.  30 tablet  0  . glucose blood (FREESTYLE LITE) test strip USE TO CHECK BLOOD SUGAR ONCE A DAY  100 each  6  . Lancets (FREESTYLE) lancets USE TO CHECK BLOOD GLUCOSE ONCE A DAY  100 each  6  . metFORMIN (GLUCOPHAGE) 1000 MG tablet TAKE 1 TABLET BY MOUTH TWO TIMES DAILY WITH MEALS  180 tablet  3  . metoprolol succinate (TOPROL-XL) 100 MG 24 hr tablet TAKE 1 TABLET DAILY  90 tablet  3  . multivitamin (THERAGRAN) per tablet Take 1 tablet by mouth daily.        . Omega-3 Fatty Acids (FISH OIL) 1000 MG CAPS Take by mouth.        . sitaGLIPtin (JANUVIA) 100 MG tablet TAKE 1 TABLET BY MOUTH DAILY  90 tablet  1  . tamsulosin (FLOMAX) 0.4 MG CAPS capsule TAKE 1 CAPSULE DAILY AFTER SUPPER  90 capsule  3   No current facility-administered medications for this  visit.    Allergies:   No Known Allergies  Social History:  The patient  reports that he has never smoked. He quit smokeless tobacco use about 24 years ago. His smokeless tobacco use included Snuff. He reports that he drinks alcohol. He reports that he does not use illicit drugs.   Family History:  The patient's family history includes Dementia in his father; Glaucoma in his mother; Hypertension in his mother; Parkinsonism in his sister; Thyroid cancer in his son. There is no history of Coronary artery disease, Diabetes, Stroke, Prostate cancer, Colon cancer, Esophageal cancer, or Stomach cancer.   ROS:  Please see the history of present illness.      All other systems reviewed and negative.   PHYSICAL EXAM: VS:  BP 130/72  Pulse 66  Ht 6' (1.829 m)  Wt 223 lb (101.152 kg)  BMI 30.24 kg/m2 Well nourished, well developed, in no acute distress HEENT: normal Neck: no JVD Cardiac:  normal S1, S2; RRR; no murmur Lungs:  clear to auscultation bilaterally, no wheezing, rhonchi or rales Abd: soft, nontender, no hepatomegaly Ext:  no edema Skin: warm and dry Neuro:  CNs 2-12 intact, no focal abnormalities noted  EKG:     NSR with no ST changes  ASSESSMENT AND PLAN:  1. 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 HTN - well controlled  - continue Diltiazem and metoprolol  Followup with me in 1 year  Signed, Armanda Magic, MD 04/12/2013 10:06 PM

## 2013-04-09 NOTE — Patient Instructions (Signed)
Your physician wants you to follow-up in: 1 YEAR with Dr Turner.  You will receive a reminder letter in the mail two months in advance. If you don't receive a letter, please call our office to schedule the follow-up appointment.  Your physician recommends that you continue on your current medications as directed. Please refer to the Current Medication list given to you today.  

## 2013-05-21 ENCOUNTER — Ambulatory Visit (INDEPENDENT_AMBULATORY_CARE_PROVIDER_SITE_OTHER): Payer: Medicare Other | Admitting: Internal Medicine

## 2013-05-21 ENCOUNTER — Encounter: Payer: Self-pay | Admitting: Internal Medicine

## 2013-05-21 VITALS — BP 105/63 | HR 67 | Temp 97.9°F | Wt 222.4 lb

## 2013-05-21 DIAGNOSIS — D721 Eosinophilia, unspecified: Secondary | ICD-10-CM | POA: Diagnosis not present

## 2013-05-21 DIAGNOSIS — L280 Lichen simplex chronicus: Secondary | ICD-10-CM

## 2013-05-21 DIAGNOSIS — D649 Anemia, unspecified: Secondary | ICD-10-CM | POA: Diagnosis not present

## 2013-05-21 DIAGNOSIS — Z23 Encounter for immunization: Secondary | ICD-10-CM

## 2013-05-21 MED ORDER — HYDROXYZINE HCL 10 MG PO TABS: 10.0000 mg | ORAL_TABLET | Freq: Three times a day (TID) | ORAL | Status: DC | PRN

## 2013-05-21 NOTE — Progress Notes (Signed)
   Subjective:    Patient ID: Chase Martinez, male    DOB: 01-09-1941, 72 y.o.   MRN: [id number]  HPI  Symptoms began 05/18/13 as in his pruritus of the legs from the knee down; this did progress to the proximal third of the dorsum of the feet. There was no associated rash , hives, or change in temperature or color.  He knows of new no new exposures to detergents or clothing.  Symptoms are worse at night. Cortisone 2% & Gold Bond topically w/o benefit  He had a similar issue the same time last year. This was treated as related to edema by his dermatologist by support hose with benefit. He has started to wear these again w/o benefit.  Review of the chart indicates he's had mild anemia in minimal elevation of his eosinophil count      Review of Systems He has no associated myalgias or diarrhea. He's had no extrinsic signs of itchy, watery eyes, or sneezing. There's been no angioedema symptoms. No cough or wheezing       Objective:   Physical Exam Gen.: Healthy and well-nourished in appearance. Alert, appropriate and cooperative throughout exam.Appears younger than stated age    Eyes: No corneal or conjunctival inflammation noted.  Nares normal. Mouth: Oral mucosa and oropharynx reveal no lesions or exudates. Teeth in good repair. Neck: No deformities, masses, or tenderness noted.  Lungs: Normal respiratory effort; chest expands symmetrically. Lungs are clear to auscultation without rales, wheezes, or increased work of breathing. Heart: Normal rate and rhythm. Normal S1 and S2. No gallop, click, or rub. No murmur. Abdomen: Bowel sounds normal; abdomen soft and nontender. No masses, organomegaly or hernias noted.Ventral hernia                                  Musculoskeletal/extremities: . No clubbing or  cyanosis noted. Range of motion normal .Tone & strength normal.Trace- 1/2 + edema. Bunions bilaterally. Toenail health fair R foot.Pes planus  Vascular:  posterior tibial pulses  are full and equal. Decreased dorsalis pedis pulses.  Neurologic: Alert and oriented x3.      Skin: Intact without suspicious lesions or rashes.Stasis hyperpigmentation of the shins extensively bilaterally. Lesser changes of the dorsum of the feet  Lymph: No cervical, axillary lymphadenopathy present. Psych: Mood and affect are normal. 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

## 2013-05-21 NOTE — Patient Instructions (Signed)
Use Aveeno Daily  Moisturizing Lotion  twice a day  for the dry skin. Bathe with moisturizing liquid soap , not bar soap. 

## 2013-05-21 NOTE — Progress Notes (Signed)
Pre visit review using our clinic review tool, if applicable. No additional management support is needed unless otherwise documented below in the visit note. 

## 2013-05-22 DIAGNOSIS — H40019 Open angle with borderline findings, low risk, unspecified eye: Secondary | ICD-10-CM | POA: Diagnosis not present

## 2013-05-22 DIAGNOSIS — E119 Type 2 diabetes mellitus without complications: Secondary | ICD-10-CM | POA: Diagnosis not present

## 2013-05-22 LAB — CBC WITH DIFFERENTIAL/PLATELET
Basophils Absolute: 0 10*3/uL (ref 0.0–0.1)
HCT: 39.2 % (ref 39.0–52.0)
Hemoglobin: 13.4 g/dL (ref 13.0–17.0)
Lymphs Abs: 1.8 10*3/uL (ref 0.7–4.0)
MCHC: 34.1 g/dL (ref 30.0–36.0)
MCV: 94.2 fl (ref 78.0–100.0)
Monocytes Absolute: 0.6 10*3/uL (ref 0.1–1.0)
Monocytes Relative: 10.6 % (ref 3.0–12.0)
Neutro Abs: 2.3 10*3/uL (ref 1.4–7.7)
Neutrophils Relative %: 43.6 % (ref 43.0–77.0)
RDW: 13.2 % (ref 11.5–14.6)
WBC: 5.4 10*3/uL (ref 4.5–10.5)

## 2013-05-24 HISTORY — PX: CATARACT EXTRACTION: SUR2

## 2013-05-28 ENCOUNTER — Other Ambulatory Visit: Payer: Self-pay | Admitting: Internal Medicine

## 2013-05-30 DIAGNOSIS — M25519 Pain in unspecified shoulder: Secondary | ICD-10-CM | POA: Diagnosis not present

## 2013-05-31 ENCOUNTER — Other Ambulatory Visit: Payer: Self-pay | Admitting: Cardiology

## 2013-06-09 ENCOUNTER — Other Ambulatory Visit: Payer: Self-pay | Admitting: Internal Medicine

## 2013-06-11 NOTE — Telephone Encounter (Signed)
Atorvastatin refilled per protocol. JG//CMA 

## 2013-06-20 DIAGNOSIS — M25519 Pain in unspecified shoulder: Secondary | ICD-10-CM | POA: Diagnosis not present

## 2013-06-22 ENCOUNTER — Telehealth: Payer: Self-pay | Admitting: Internal Medicine

## 2013-06-22 MED ORDER — SITAGLIPTIN PHOSPHATE 100 MG PO TABS: ORAL_TABLET | ORAL | Status: DC

## 2013-06-22 NOTE — Telephone Encounter (Signed)
Patient states he needs a 30 day supply of Januvia sent to [facility] on [address]. He ran out of his mail order rx and it will be 2 weeks before he gets it.

## 2013-06-22 NOTE — Telephone Encounter (Signed)
Done

## 2013-06-28 ENCOUNTER — Encounter: Payer: Self-pay | Admitting: Internal Medicine

## 2013-06-28 ENCOUNTER — Ambulatory Visit (INDEPENDENT_AMBULATORY_CARE_PROVIDER_SITE_OTHER): Payer: Medicare Other | Admitting: Internal Medicine

## 2013-06-28 VITALS — BP 125/71 | HR 75 | Temp 97.9°F | Wt 223.0 lb

## 2013-06-28 DIAGNOSIS — I491 Atrial premature depolarization: Secondary | ICD-10-CM

## 2013-06-28 DIAGNOSIS — I8310 Varicose veins of unspecified lower extremity with inflammation: Secondary | ICD-10-CM

## 2013-06-28 DIAGNOSIS — I872 Venous insufficiency (chronic) (peripheral): Secondary | ICD-10-CM

## 2013-06-28 DIAGNOSIS — I10 Essential (primary) hypertension: Secondary | ICD-10-CM | POA: Diagnosis not present

## 2013-06-28 DIAGNOSIS — E119 Type 2 diabetes mellitus without complications: Secondary | ICD-10-CM | POA: Diagnosis not present

## 2013-06-28 LAB — BASIC METABOLIC PANEL
BUN: 18 mg/dL (ref 6–23)
CHLORIDE: 99 meq/L (ref 96–112)
CO2: 28 mEq/L (ref 19–32)
Calcium: 9.6 mg/dL (ref 8.4–10.5)
Creatinine, Ser: 0.9 mg/dL (ref 0.4–1.5)
GFR: 86.39 mL/min (ref >60.00)
GLUCOSE: 195 mg/dL — AB (ref 70–99)
POTASSIUM: 4.2 meq/L (ref 3.5–5.1)
Sodium: 133 mEq/L — ABNORMAL LOW (ref 135–145)

## 2013-06-28 LAB — HEMOGLOBIN A1C: HEMOGLOBIN A1C: 7.3 % — AB (ref 4.6–6.5)

## 2013-06-28 NOTE — Assessment & Plan Note (Signed)
Dry itchy pretibial skin likely stasis dermatitis,, doing well on moisturizing

## 2013-06-28 NOTE — Assessment & Plan Note (Signed)
Ambulatory CBGs fasting slightly higher in the last couple of weeks, he has not changed anything, will check the A1c, will   adjust his medications if needed. He did have some trouble getting Januvia but only skipped  4 days.

## 2013-06-28 NOTE — Assessment & Plan Note (Signed)
No recent ambulatory BPs, BP today very good, check a BMP

## 2013-06-28 NOTE — Assessment & Plan Note (Signed)
Saw cardiology recently, stable

## 2013-06-28 NOTE — Progress Notes (Signed)
Pre visit review using our clinic review tool, if applicable. No additional management support is needed unless otherwise documented below in the visit note. 

## 2013-06-28 NOTE — Patient Instructions (Addendum)
Get your blood work before you leave   Next visit is for a physical exam in 4 months  fasting Please make an appointment    Check the  blood pressure 2 or 3 times a month  be sure it is between 110/60 and 140/85. Ideal blood pressure is 120/80. If it is consistently higher or lower, let me know

## 2013-06-28 NOTE — Progress Notes (Signed)
   Subjective:    Patient ID: Chase Martinez, male    DOB: [birth year], 73 y.o.   MRN: [id number]  HPI Routine office visit Since the last time he was here, he saw cardiology, note reviewed, he was felt to be stable. He had an accident last week, injured his right shoulder, followup by Dr. Rhona Raider Was seen in December d/y itching @ the lower extremities, diagnosed with stasis dermatitis, doing better Diabetes--good medication compliance, his blood sugar has been a little high her in the last couple of weeks, no apparent reason, fasting CBGs sometimes in the 150s to 170s. Hypertension--good medication compliance, no recent ambulatory  BPs  Past Medical History  Diagnosis Date  . Diabetes mellitus   . Basal cell carcinoma   . Premature atrial contractions   . Hypertension   . Hyperlipidemia      Past Surgical History  Procedure Laterality Date  . Shoulder surgery      right  . Pilonidal cyst excision    . Tonsillectomy      Review of Systems Denies chest pain or difficulty breathing No  nausea, vomiting, diarrhea.     Objective:   Physical Exam BP 125/71  Pulse 75  Temp(Src) 97.9 F (36.6 C)  Wt 223 lb (101.152 kg)  SpO2 96% General -- alert, well-developed, NAD.   Lungs -- normal respiratory effort, no intercostal retractions, no accessory muscle use, and normal breath sounds.  Heart-- normal rate, regular rhythm, no murmur.  Extremities-- no pretibial edema bilaterally , pretibial skin is dry and hyperpigmented, no excoriations or lesions Neurologic--  alert & oriented X3. Speech normal, gait normal, strength normal in all extremities.  Psych-- Cognition and judgment appear intact. Cooperative with normal attention span and concentration. No anxious or depressed appearing.     Assessment & Plan:

## 2013-06-29 ENCOUNTER — Telehealth: Payer: Self-pay | Admitting: Internal Medicine

## 2013-06-29 ENCOUNTER — Telehealth: Payer: Self-pay

## 2013-06-29 MED ORDER — GLIMEPIRIDE 2 MG PO TABS: 2.0000 mg | ORAL_TABLET | Freq: Every day | ORAL | Status: DC

## 2013-06-29 NOTE — Addendum Note (Signed)
Addended by: Peggyann Shoals on: 06/29/2013 03:18 PM   Modules accepted: Orders

## 2013-06-29 NOTE — Telephone Encounter (Signed)
Relevant patient education mailed to patient.  

## 2013-06-29 NOTE — Telephone Encounter (Signed)
Relevant patient education assigned to patient using Emmi. ° °

## 2013-07-02 ENCOUNTER — Other Ambulatory Visit: Payer: Self-pay | Admitting: *Deleted

## 2013-07-02 DIAGNOSIS — E119 Type 2 diabetes mellitus without complications: Secondary | ICD-10-CM

## 2013-07-02 MED ORDER — GLIMEPIRIDE 2 MG PO TABS: 2.0000 mg | ORAL_TABLET | Freq: Every day | ORAL | Status: DC

## 2013-07-06 ENCOUNTER — Other Ambulatory Visit: Payer: Self-pay | Admitting: Internal Medicine

## 2013-07-11 DIAGNOSIS — M25519 Pain in unspecified shoulder: Secondary | ICD-10-CM | POA: Diagnosis not present

## 2013-07-18 ENCOUNTER — Other Ambulatory Visit: Payer: Self-pay | Admitting: *Deleted

## 2013-07-18 MED ORDER — TAMSULOSIN HCL 0.4 MG PO CAPS: ORAL_CAPSULE | ORAL | Status: DC

## 2013-07-27 ENCOUNTER — Other Ambulatory Visit: Payer: Self-pay | Admitting: Internal Medicine

## 2013-07-30 DIAGNOSIS — Z85828 Personal history of other malignant neoplasm of skin: Secondary | ICD-10-CM | POA: Diagnosis not present

## 2013-07-30 DIAGNOSIS — D485 Neoplasm of uncertain behavior of skin: Secondary | ICD-10-CM | POA: Diagnosis not present

## 2013-07-30 DIAGNOSIS — L570 Actinic keratosis: Secondary | ICD-10-CM | POA: Diagnosis not present

## 2013-08-04 ENCOUNTER — Other Ambulatory Visit: Payer: Self-pay | Admitting: Internal Medicine

## 2013-08-05 ENCOUNTER — Other Ambulatory Visit: Payer: Self-pay | Admitting: Internal Medicine

## 2013-08-08 DIAGNOSIS — M25519 Pain in unspecified shoulder: Secondary | ICD-10-CM | POA: Diagnosis not present

## 2013-08-09 ENCOUNTER — Other Ambulatory Visit: Payer: Self-pay | Admitting: Internal Medicine

## 2013-09-05 DIAGNOSIS — M25519 Pain in unspecified shoulder: Secondary | ICD-10-CM | POA: Diagnosis not present

## 2013-09-07 ENCOUNTER — Other Ambulatory Visit: Payer: Self-pay | Admitting: *Deleted

## 2013-09-07 MED ORDER — FREESTYLE LANCETS MISC: Status: DC

## 2013-09-11 ENCOUNTER — Telehealth: Payer: Self-pay | Admitting: Internal Medicine

## 2013-09-11 NOTE — Telephone Encounter (Signed)
Patient called and stated that [facility] has tried calling us to write a rx for freestyle bd ultra-fine lancets for Chase Martinez. [facility] are out of the FREESTYLE LITE test strip. Please advise.

## 2013-09-11 NOTE — Telephone Encounter (Signed)
LMOM @ (4:06pm) asking the pt to RTC regarding note below.//AB/CMA

## 2013-09-13 ENCOUNTER — Other Ambulatory Visit: Payer: Self-pay | Admitting: *Deleted

## 2013-09-13 DIAGNOSIS — E119 Type 2 diabetes mellitus without complications: Secondary | ICD-10-CM

## 2013-09-13 MED ORDER — LANCETS ULTRA FINE MISC: Status: DC

## 2013-09-13 NOTE — Telephone Encounter (Signed)
Spoke with patient who stated he needed BD Ultra Fine Lancets sent to his pharmacy. Rx sent to [facility]

## 2013-09-25 ENCOUNTER — Ambulatory Visit (INDEPENDENT_AMBULATORY_CARE_PROVIDER_SITE_OTHER): Payer: Medicare Other | Admitting: Internal Medicine

## 2013-09-25 ENCOUNTER — Encounter: Payer: Self-pay | Admitting: Internal Medicine

## 2013-09-25 VITALS — BP 105/62 | HR 72 | Temp 97.8°F | Wt 224.0 lb

## 2013-09-25 DIAGNOSIS — E119 Type 2 diabetes mellitus without complications: Secondary | ICD-10-CM

## 2013-09-25 DIAGNOSIS — I10 Essential (primary) hypertension: Secondary | ICD-10-CM | POA: Diagnosis not present

## 2013-09-25 LAB — HEMOGLOBIN A1C: Hgb A1c MFr Bld: 6.7 % — ABNORMAL HIGH (ref 4.6–6.5)

## 2013-09-25 NOTE — Progress Notes (Signed)
Pre visit review using our clinic review tool, if applicable. No additional management support is needed unless otherwise documented below in the visit note. 

## 2013-09-25 NOTE — Assessment & Plan Note (Signed)
BP slightly low today but well controlled otherwise. No change

## 2013-09-25 NOTE — Progress Notes (Signed)
Subjective:    Patient ID: Chase Martinez, male    DOB: 1940/09/18, 73 y.o.   MRN: [id number]  DOS:  09/25/2013 Type of  visit: ROV DM, he started glimepiride, doing great, average CBGs decreased from 140 to  122 Ambulatory BPs in the 120, 130. Today is a little low, he feels very well this morning.   ROS Denies chest pain or difficulty breathing. Now symptoms consistent with low blood sugars Denies any lower extremity paresthesias. Reports he had an eye check within 1 year  Past Medical History  Diagnosis Date  . Diabetes mellitus   . Basal cell carcinoma   . Premature atrial contractions   . Hypertension   . Hyperlipidemia     Past Surgical History  Procedure Laterality Date  . Shoulder surgery      right  . Pilonidal cyst excision    . Tonsillectomy      History   Social History  . Marital Status: Married    Spouse Name: N/A    Number of Children: 2  . Years of Education: N/A   Occupational History  . Retired Therapist, art Engineering geologist)    Social History Main Topics  . Smoking status: Never Smoker   . Smokeless tobacco: Former Systems developer    Types: Snuff    Quit date: 05/24/1988  . Alcohol Use: 0.0 oz/week     Comment: occasional beer and wine  . Drug Use: No  . Sexual Activity: Not on file   Other Topics Concern  . Not on file   Social History Narrative                    Medication List       This list is accurate as of: 09/25/13 10:30 PM.  Always use your most recent med list.               aspirin 81 MG tablet  Take 81 mg by mouth daily.     atorvastatin 20 MG tablet  Commonly known as:  LIPITOR  TAKE 1 TABLET DAILY     diltiazem 240 MG 24 hr tablet  Commonly known as:  CARDIZEM LA  TAKE 1 TABLET ONCE DAILY     Fish Oil 1000 MG Caps  Take by mouth.     freestyle lancets  USE TO CHECK BLOOD GLUCOSE ONCE A DAY     LANCETS ULTRA FINE Misc  once. Use as directed to check blood sugars 3-4 times daily.     FREESTYLE LITE test strip    Generic drug:  glucose blood  USE TO CHECK BLOOD SUGAR ONCE DAILY     furosemide 20 MG tablet  Commonly known as:  LASIX  TAKE 1 TABLET DAILY     glimepiride 2 MG tablet  Commonly known as:  AMARYL  Take 1 tablet (2 mg total) by mouth daily before breakfast.     hydrOXYzine 10 MG tablet  Commonly known as:  ATARAX/VISTARIL  Take 1 tablet (10 mg total) by mouth 3 (three) times daily as needed.     JANUVIA 100 MG tablet  Generic drug:  sitaGLIPtin  TAKE 1 TABLET DAILY     metFORMIN 1000 MG tablet  Commonly known as:  GLUCOPHAGE  TAKE 1 TABLET TWICE A DAY WITH MEALS     metoprolol succinate 100 MG 24 hr tablet  Commonly known as:  TOPROL-XL  TAKE 1 TABLET DAILY     multivitamin per tablet  Take 1 tablet  by mouth daily.     tamsulosin 0.4 MG Caps capsule  Commonly known as:  FLOMAX  TAKE 1 CAPSULE DAILY AFTER SUPPER           Objective:   Physical Exam BP 105/62  Pulse 72  Temp(Src) 97.8 F (36.6 C)  Wt 224 lb (101.606 kg)  SpO2 97% General -- alert, well-developed, NAD.  HEENT-- Not pale Lungs -- normal respiratory effort, no intercostal retractions, no accessory muscle use, and normal breath sounds.  Heart-- normal rate, regular rhythm, no murmur.  Extremities-- no pretibial edema bilaterally  Neurologic--  alert & oriented X3. Speech normal, gait normal, strength normal in all extremities.  Psych-- Cognition and judgment appear intact. Cooperative with normal attention span and concentration. No anxious or depressed appearing.        Assessment & Plan:

## 2013-09-25 NOTE — Patient Instructions (Signed)
Get your blood work before you leave    

## 2013-09-25 NOTE — Assessment & Plan Note (Addendum)
Since the last visit, we added amaryl, average CBG has dropped to 120, no symptoms of low blood sugar. Plan: Continue present care, check a A1c

## 2013-09-26 ENCOUNTER — Telehealth: Payer: Self-pay | Admitting: Internal Medicine

## 2013-09-26 NOTE — Telephone Encounter (Signed)
Relevant patient education assigned to patient using Emmi. ° °

## 2013-10-29 ENCOUNTER — Telehealth: Payer: Self-pay

## 2013-10-29 NOTE — Telephone Encounter (Signed)
Medication List and allergies:  Reviewed and updated  90 day supply/mail order: [facility] [address] Local prescriptions: [facility]  Immunizations due: UTD  A/P:   No changes to FH, PSH or Personal Hx Flu vaccine--03/2013 Tdap--2014 PNA--2009 Shingles--2004 CCS--07/2011--Dr Pyrtle--adenomatous polyps--q 5 years PSA--04/2011--2.04  To Discuss with Provider: FYI--wife has been dx with breast cancer

## 2013-10-30 ENCOUNTER — Ambulatory Visit (INDEPENDENT_AMBULATORY_CARE_PROVIDER_SITE_OTHER): Payer: Medicare Other | Admitting: Internal Medicine

## 2013-10-30 ENCOUNTER — Encounter: Payer: Self-pay | Admitting: Internal Medicine

## 2013-10-30 VITALS — BP 119/64 | HR 63 | Temp 98.3°F | Ht 73.0 in | Wt 223.6 lb

## 2013-10-30 DIAGNOSIS — E119 Type 2 diabetes mellitus without complications: Secondary | ICD-10-CM | POA: Diagnosis not present

## 2013-10-30 DIAGNOSIS — Z23 Encounter for immunization: Secondary | ICD-10-CM | POA: Diagnosis not present

## 2013-10-30 DIAGNOSIS — N400 Enlarged prostate without lower urinary tract symptoms: Secondary | ICD-10-CM | POA: Diagnosis not present

## 2013-10-30 DIAGNOSIS — E785 Hyperlipidemia, unspecified: Secondary | ICD-10-CM | POA: Diagnosis not present

## 2013-10-30 DIAGNOSIS — Z0000 Encounter for general adult medical examination without abnormal findings: Secondary | ICD-10-CM

## 2013-10-30 LAB — PSA: PSA: 1.71 ng/mL (ref 0.10–4.00)

## 2013-10-30 LAB — BASIC METABOLIC PANEL
BUN: 17 mg/dL (ref 6–23)
CHLORIDE: 104 meq/L (ref 96–112)
CO2: 22 meq/L (ref 19–32)
Calcium: 9.4 mg/dL (ref 8.4–10.5)
Creatinine, Ser: 0.9 mg/dL (ref 0.4–1.5)
GFR: 93.38 mL/min (ref >60.00)
Glucose, Bld: 112 mg/dL — ABNORMAL HIGH (ref 70–99)
Potassium: 4.5 mEq/L (ref 3.5–5.1)
Sodium: 135 mEq/L (ref 135–145)

## 2013-10-30 LAB — LIPID PANEL
CHOL/HDL RATIO: 4
Cholesterol: 117 mg/dL (ref 0–200)
HDL: 30 mg/dL — ABNORMAL LOW (ref >39.00)
LDL CALC: 71 mg/dL (ref 0–99)
NonHDL: 87
TRIGLYCERIDES: 81 mg/dL (ref 0.0–149.0)
VLDL: 16.2 mg/dL (ref 0.0–40.0)

## 2013-10-30 LAB — AST: AST: 40 U/L — ABNORMAL HIGH (ref 0–37)

## 2013-10-30 LAB — ALT: ALT: 44 U/L (ref 0–53)

## 2013-10-30 NOTE — Patient Instructions (Signed)
Get your blood work before you leave     Next visit is for routine check up regards your blood sugar , blood pressure in 4 months  No need to come back fasting Please make an appointment       Fall Prevention and [place] cause injuries and can affect all age groups. It is possible to use preventive measures to significantly decrease the likelihood of falls. There are many simple measures which can make your home safer and prevent falls. OUTDOORS  Repair cracks and edges of walkways and driveways.  Remove high doorway thresholds.  Trim shrubbery on the main path into your home.  Have good outside lighting.  Clear walkways of tools, rocks, debris, and clutter.  Check that handrails are not broken and are securely fastened. Both sides of steps should have handrails.  Have leaves, snow, and ice cleared regularly.  Use sand or salt on walkways during winter months.  In the garage, clean up grease or oil spills. BATHROOM  Install night lights.  Install grab bars by the toilet and in the tub and shower.  Use non-skid mats or decals in the tub or shower.  Place a plastic non-slip stool in the shower to sit on, if needed.  Keep floors dry and clean up all water on the floor immediately.  Remove soap buildup in the tub or shower on a regular basis.  Secure bath mats with non-slip, double-sided rug tape.  Remove throw rugs and tripping hazards from the floors. BEDROOMS  Install night lights.  Make sure a bedside light is easy to reach.  Do not use oversized bedding.  Keep a telephone by your bedside.  Have a firm chair with side arms to use for getting dressed.  Remove throw rugs and tripping hazards from the floor. KITCHEN  Keep handles on pots and pans turned toward the center of the stove. Use back burners when possible.  Clean up spills quickly and allow time for drying.  Avoid walking on wet floors.  Avoid hot utensils and knives.  Position  shelves so they are not too high or low.  Place commonly used objects within easy reach.  If necessary, use a sturdy step stool with a grab bar when reaching.  Keep electrical cables out of the way.  Do not use floor polish or wax that makes floors slippery. If you must use wax, use non-skid floor wax.  Remove throw rugs and tripping hazards from the floor. STAIRWAYS  Never leave objects on stairs.  Place handrails on both sides of stairways and use them. Fix any loose handrails. Make sure handrails on both sides of the stairways are as long as the stairs.  Check carpeting to make sure it is firmly attached along stairs. Make repairs to worn or loose carpet promptly.  Avoid placing throw rugs at the top or bottom of stairways, or properly secure the rug with carpet tape to prevent slippage. Get rid of throw rugs, if possible.  Have an electrician put in a light switch at the top and bottom of the stairs. OTHER FALL PREVENTION TIPS  Wear low-heel or rubber-soled shoes that are supportive and fit well. Wear closed toe shoes.  When using a stepladder, make sure it is fully opened and both spreaders are firmly locked. Do not climb a closed stepladder.  Add color or contrast paint or tape to grab bars and handrails in your home. Place contrasting color strips on first and last steps.  Learn  and use mobility aids as needed. Install an electrical emergency response system.  Turn on lights to avoid dark areas. Replace light bulbs that burn out immediately. Get light switches that glow.  Arrange furniture to create clear pathways. Keep furniture in the same place.  Firmly attach carpet with non-skid or double-sided tape.  Eliminate uneven floor surfaces.  Select a carpet pattern that does not visually hide the edge of steps.  Be aware of all pets. OTHER HOME SAFETY TIPS  Set the water temperature for 120 F (48.8 C).  Keep emergency numbers on or near the telephone.  Keep  smoke detectors on every level of the home and near sleeping areas. Document Released: 04/30/2002 Document Revised: 11/09/2011 Document Reviewed: 07/30/2011 [facility] Patient Information 2014 Reader.

## 2013-10-30 NOTE — Assessment & Plan Note (Addendum)
Tdap 2013 Pneumonia shot 5-09 prevnar today had shingles shot 2009 aprox  Check a PSA today  , DRE 2014 normal   Cscope--  Dr Vladimir Faster in 2002 ,another Cscope 3-13, 1 polyp, next 5 years Diet-exercise discussed

## 2013-10-30 NOTE — Progress Notes (Signed)
Subjective:    Patient ID: Chase Martinez, male    DOB: Feb 21, 1941, 73 y.o.   MRN: [id number]  DOS:  10/30/2013 Type of  Visit:   Here for Medicare AWV:  1. Risk factors based on Past M, S, F history: reviewed  2. Physical Activities: still plays golf , active  3. Depression/mood: Neg screen  4. Hearing: No problemss noted or reported  5. ADL's: Totally independent , still drives  6. Fall Risk: no recent falls, see instructions  7. home Safety: does feel safe at home  8. Height, weight, &visual acuity: see VS, vision corrected w/glasses, sees eye doctor regulalrly  9. Counseling: provided  10. Labs ordered based on risk factors: if needed  11. Referral Coordination: if needed  12. Care Plan, see assessment and plan  13. Cognitive Assessment: Cognition and motor appropriate   In addition, today we discussed the following: Diabetes, good medication compliance, CBGs around 100, 140. No low sugar symptoms High cholesterol, good compliance with Lipitor without apparent side effects, still has some left shoulder pain but is unrelated to medication. High blood pressure, good medication compliance, ambulatory BPs around 115/62.  ROS Denies chest pain or difficulty breathing. No lower extremity edema. Denies nausea, vomiting, diarrhea. No dysuria, hematuria or difficulty urinating  Past Medical History  Diagnosis Date  . Diabetes mellitus   . Basal cell carcinoma     dr Ronnald Ramp  . Premature atrial contractions   . Hypertension   . Hyperlipidemia     Past Surgical History  Procedure Laterality Date  . Shoulder surgery      right  . Pilonidal cyst excision    . Tonsillectomy      History   Social History  . Marital Status: Married    Spouse Name: N/A    Number of Children: 2  . Years of Education: N/A   Occupational History  . Retired Therapist, art Engineering geologist)    Social History Main Topics  . Smoking status: Former Smoker    Quit date: 05/24/1966  . Smokeless tobacco: Former  Systems developer    Types: Snuff    Quit date: 05/24/1988  . Alcohol Use: 0.0 oz/week     Comment: occasional beer and wine  . Drug Use: No  . Sexual Activity: Not on file   Other Topics Concern  . Not on file   Social History Narrative               Family History  Problem Relation Age of Onset  . Coronary artery disease Neg Hx   . Diabetes Neg Hx   . Stroke Neg Hx   . Prostate cancer Neg Hx   . Colon cancer Neg Hx   . Esophageal cancer Neg Hx   . Stomach cancer Neg Hx   . Hypertension Mother   . Glaucoma Mother   . Dementia Father   . Thyroid cancer Son   . Parkinsonism Sister          Medication List       This list is accurate as of: 10/30/13  7:18 PM.  Always use your most recent med list.               aspirin 81 MG tablet  Take 81 mg by mouth daily.     atorvastatin 20 MG tablet  Commonly known as:  LIPITOR  TAKE 1 TABLET DAILY     diltiazem 240 MG 24 hr tablet  Commonly known as:  CARDIZEM LA  TAKE 1 TABLET ONCE DAILY     Fish Oil 1000 MG Caps  Take by mouth.     freestyle lancets  USE TO CHECK BLOOD GLUCOSE ONCE A DAY     LANCETS ULTRA FINE Misc  once. Use as directed to check blood sugars 3-4 times daily.     FREESTYLE LITE test strip  Generic drug:  glucose blood  USE TO CHECK BLOOD SUGAR ONCE DAILY     furosemide 20 MG tablet  Commonly known as:  LASIX  TAKE 1 TABLET DAILY     glimepiride 2 MG tablet  Commonly known as:  AMARYL  Take 1 tablet (2 mg total) by mouth daily before breakfast.     JANUVIA 100 MG tablet  Generic drug:  sitaGLIPtin  TAKE 1 TABLET DAILY     metFORMIN 1000 MG tablet  Commonly known as:  GLUCOPHAGE  TAKE 1 TABLET TWICE A DAY WITH MEALS     metoprolol succinate 100 MG 24 hr tablet  Commonly known as:  TOPROL-XL  TAKE 1 TABLET DAILY     multivitamin per tablet  Take 1 tablet by mouth daily.     tamsulosin 0.4 MG Caps capsule  Commonly known as:  FLOMAX  TAKE 1 CAPSULE DAILY AFTER SUPPER             Objective:   Physical Exam BP 119/64  Pulse 63  Temp(Src) 98.3 F (36.8 C)  Ht 6\' 1"  (1.854 m)  Wt 223 lb 9.6 oz (101.424 kg)  BMI 29.51 kg/m2  SpO2 97% General -- alert, well-developed, NAD.  Neck --no thyromegaly , normal carotid pulse  HEENT-- Not pale.  Lungs -- normal respiratory effort, no intercostal retractions, no accessory muscle use, and normal breath sounds.  Heart-- normal rate, regular rhythm, no murmur.  Abdomen-- Not distended, good bowel sounds,soft, non-tender. Extremities-- no pretibial edema bilaterally  Neurologic--  alert & oriented X3. Speech normal, gait appropriate for age, strength symmetric and appropriate for age.  Psych-- Cognition and judgment appear intact. Cooperative with normal attention span and concentration. No anxious or depressed appearing.      Assessment & Plan:

## 2013-10-30 NOTE — Assessment & Plan Note (Signed)
Chart and pertinent labs reviewed  Due for a FLP, good med compliance

## 2013-10-30 NOTE — Assessment & Plan Note (Signed)
Chart and pertinent labs reviewed  Under excellent control without apparent side effects. No change. Does see the eye Dr. regularly

## 2013-10-30 NOTE — Assessment & Plan Note (Signed)
Chart and pertinent labs reviewed   Asymptomatic, on Flomax, due for a PSA. DRE last year normal.

## 2013-10-30 NOTE — Assessment & Plan Note (Signed)
Chart and pertinent labs reviewed  Due for a BMP, BP well controlled, continue with present medications

## 2013-10-30 NOTE — Progress Notes (Signed)
Pre visit review using our clinic review tool, if applicable. No additional management support is needed unless otherwise documented below in the visit note. 

## 2013-11-01 ENCOUNTER — Other Ambulatory Visit: Payer: Self-pay | Admitting: Internal Medicine

## 2013-11-02 ENCOUNTER — Other Ambulatory Visit: Payer: Self-pay | Admitting: Internal Medicine

## 2013-11-15 ENCOUNTER — Other Ambulatory Visit: Payer: Self-pay | Admitting: *Deleted

## 2013-11-15 MED ORDER — FUROSEMIDE 20 MG PO TABS: ORAL_TABLET | ORAL | Status: DC

## 2013-11-22 ENCOUNTER — Other Ambulatory Visit: Payer: Self-pay | Admitting: Internal Medicine

## 2013-12-04 DIAGNOSIS — H40019 Open angle with borderline findings, low risk, unspecified eye: Secondary | ICD-10-CM | POA: Diagnosis not present

## 2013-12-04 DIAGNOSIS — H02839 Dermatochalasis of unspecified eye, unspecified eyelid: Secondary | ICD-10-CM | POA: Diagnosis not present

## 2013-12-04 DIAGNOSIS — H2510 Age-related nuclear cataract, unspecified eye: Secondary | ICD-10-CM | POA: Diagnosis not present

## 2013-12-04 DIAGNOSIS — E119 Type 2 diabetes mellitus without complications: Secondary | ICD-10-CM | POA: Diagnosis not present

## 2013-12-04 LAB — HM DIABETES EYE EXAM

## 2013-12-12 DIAGNOSIS — H2589 Other age-related cataract: Secondary | ICD-10-CM | POA: Diagnosis not present

## 2013-12-12 DIAGNOSIS — H21569 Pupillary abnormality, unspecified eye: Secondary | ICD-10-CM | POA: Diagnosis not present

## 2013-12-12 DIAGNOSIS — E119 Type 2 diabetes mellitus without complications: Secondary | ICD-10-CM | POA: Diagnosis not present

## 2013-12-12 DIAGNOSIS — H2510 Age-related nuclear cataract, unspecified eye: Secondary | ICD-10-CM | POA: Diagnosis not present

## 2013-12-12 DIAGNOSIS — H25019 Cortical age-related cataract, unspecified eye: Secondary | ICD-10-CM | POA: Diagnosis not present

## 2013-12-23 ENCOUNTER — Other Ambulatory Visit: Payer: Self-pay | Admitting: Internal Medicine

## 2013-12-24 ENCOUNTER — Encounter: Payer: Self-pay | Admitting: Internal Medicine

## 2013-12-29 ENCOUNTER — Other Ambulatory Visit: Payer: Self-pay | Admitting: Internal Medicine

## 2014-01-04 ENCOUNTER — Other Ambulatory Visit: Payer: Self-pay | Admitting: Internal Medicine

## 2014-01-09 DIAGNOSIS — H25019 Cortical age-related cataract, unspecified eye: Secondary | ICD-10-CM | POA: Diagnosis not present

## 2014-01-09 DIAGNOSIS — E119 Type 2 diabetes mellitus without complications: Secondary | ICD-10-CM | POA: Diagnosis not present

## 2014-01-09 DIAGNOSIS — H2181 Floppy iris syndrome: Secondary | ICD-10-CM | POA: Diagnosis not present

## 2014-01-09 DIAGNOSIS — H2589 Other age-related cataract: Secondary | ICD-10-CM | POA: Diagnosis not present

## 2014-01-09 DIAGNOSIS — H2510 Age-related nuclear cataract, unspecified eye: Secondary | ICD-10-CM | POA: Diagnosis not present

## 2014-02-04 DIAGNOSIS — Z85828 Personal history of other malignant neoplasm of skin: Secondary | ICD-10-CM | POA: Diagnosis not present

## 2014-02-04 DIAGNOSIS — D235 Other benign neoplasm of skin of trunk: Secondary | ICD-10-CM | POA: Diagnosis not present

## 2014-02-04 DIAGNOSIS — L570 Actinic keratosis: Secondary | ICD-10-CM | POA: Diagnosis not present

## 2014-03-07 ENCOUNTER — Encounter: Payer: Self-pay | Admitting: Internal Medicine

## 2014-03-07 ENCOUNTER — Ambulatory Visit (INDEPENDENT_AMBULATORY_CARE_PROVIDER_SITE_OTHER): Payer: Medicare Other | Admitting: Internal Medicine

## 2014-03-07 VITALS — BP 128/79 | HR 62 | Temp 97.6°F | Wt 226.4 lb

## 2014-03-07 DIAGNOSIS — E785 Hyperlipidemia, unspecified: Secondary | ICD-10-CM | POA: Diagnosis not present

## 2014-03-07 DIAGNOSIS — E119 Type 2 diabetes mellitus without complications: Secondary | ICD-10-CM | POA: Diagnosis not present

## 2014-03-07 DIAGNOSIS — Z23 Encounter for immunization: Secondary | ICD-10-CM

## 2014-03-07 LAB — AST: AST: 31 U/L (ref 0–37)

## 2014-03-07 LAB — ALT: ALT: 38 U/L (ref 0–53)

## 2014-03-07 LAB — HEMOGLOBIN A1C: HEMOGLOBIN A1C: 6.6 % — AB (ref 4.6–6.5)

## 2014-03-07 MED ORDER — TAMSULOSIN HCL 0.4 MG PO CAPS: ORAL_CAPSULE | ORAL | Status: DC

## 2014-03-07 NOTE — Patient Instructions (Signed)
Get your blood work before you leave     Please come back to the office  In 4-6 months for a routine check up

## 2014-03-07 NOTE — Progress Notes (Signed)
Pre visit review using our clinic review tool, if applicable. No additional management support is needed unless otherwise documented below in the visit note. 

## 2014-03-07 NOTE — Assessment & Plan Note (Addendum)
Good medication compliance, great ambulatory blood sugars, check a A1c.

## 2014-03-07 NOTE — Assessment & Plan Note (Addendum)
Good medication compliance, cholesterol well-controlled

## 2014-03-07 NOTE — Progress Notes (Signed)
Subjective:    Patient ID: Chase Martinez, male    DOB: 1941/03/09, 73 y.o.   MRN: [id number]  DOS:  03/07/2014 Type of visit - description : rov Interval history: Good medication compliance, ambulatory CBGs on average 119 before breakfast. Ambulatory BPs 120/60, 130/70. Had cataract surgery, vision is great.   ROS Denies chest or difficulty breathing No  nausea, vomiting, diarrhea.  Past Medical History  Diagnosis Date  . Diabetes mellitus   . Basal cell carcinoma     dr Ronnald Ramp  . Premature atrial contractions   . Hypertension   . Hyperlipidemia     Past Surgical History  Procedure Laterality Date  . Shoulder surgery      right  . Pilonidal cyst excision    . Tonsillectomy    . Cataract extraction Bilateral 2015    History   Social History  . Marital Status: Married    Spouse Name: N/A    Number of Children: 2  . Years of Education: N/A   Occupational History  . Retired Therapist, art Engineering geologist)    Social History Main Topics  . Smoking status: Former Smoker    Quit date: 05/24/1966  . Smokeless tobacco: Former Systems developer    Types: Snuff    Quit date: 05/24/1988  . Alcohol Use: 0.0 oz/week     Comment: occasional beer and wine  . Drug Use: No  . Sexual Activity: Not on file   Other Topics Concern  . Not on file   Social History Narrative                    Medication List       This list is accurate as of: 03/07/14  2:20 PM.  Always use your most recent med list.               aspirin 81 MG tablet  Take 81 mg by mouth daily.     atorvastatin 20 MG tablet  Commonly known as:  LIPITOR  TAKE 1 TABLET DAILY     diltiazem 240 MG 24 hr tablet  Commonly known as:  CARDIZEM LA  TAKE 1 TABLET ONCE DAILY     Fish Oil 1000 MG Caps  Take by mouth.     FREESTYLE LITE test strip  Generic drug:  glucose blood  USE TO CHECK BLOOD SUGAR ONCE DAILY     furosemide 20 MG tablet  Commonly known as:  LASIX  TAKE 1 TABLET DAILY     glimepiride 2 MG  tablet  Commonly known as:  AMARYL  TAKE 1 TABLET DAILY BEFORE BREAKFAST     JANUVIA 100 MG tablet  Generic drug:  sitaGLIPtin  TAKE 1 TABLET DAILY     LANCETS ULTRA FINE Misc  once. Use as directed to check blood sugars 3-4 times daily.     metFORMIN 1000 MG tablet  Commonly known as:  GLUCOPHAGE  TAKE 1 TABLET TWICE A DAY WITH MEALS     metoprolol succinate 100 MG 24 hr tablet  Commonly known as:  TOPROL-XL  TAKE 1 TABLET DAILY     multivitamin per tablet  Take 1 tablet by mouth daily.     tamsulosin 0.4 MG Caps capsule  Commonly known as:  FLOMAX  TAKE 1 CAPSULE DAILY AFTER SUPPER           Objective:   Physical Exam BP 128/79  Pulse 62  Temp(Src) 97.6 F (36.4 C) (Oral)  Wt 226 lb 6  oz (102.683 kg)  SpO2 96% General -- alert, well-developed, NAD.   Lungs -- normal respiratory effort, no intercostal retractions, no accessory muscle use, and normal breath sounds.  Heart-- normal rate, regular rhythm, no murmur.   Extremities-- no pretibial edema bilaterally   Psych-- Cognition and judgment appear intact. Cooperative with normal attention span and concentration. No anxious or depressed appearing.        Assessment & Plan:   Discuss high dose versus regular dose flu shot--elected regular

## 2014-03-25 ENCOUNTER — Other Ambulatory Visit: Payer: Self-pay | Admitting: Internal Medicine

## 2014-04-10 ENCOUNTER — Encounter: Payer: Self-pay | Admitting: Cardiology

## 2014-04-10 ENCOUNTER — Ambulatory Visit (INDEPENDENT_AMBULATORY_CARE_PROVIDER_SITE_OTHER): Payer: Medicare Other | Admitting: Cardiology

## 2014-04-10 VITALS — BP 134/68 | HR 58 | Ht 73.0 in | Wt 228.0 lb

## 2014-04-10 DIAGNOSIS — I10 Essential (primary) hypertension: Secondary | ICD-10-CM | POA: Diagnosis not present

## 2014-04-10 DIAGNOSIS — I491 Atrial premature depolarization: Secondary | ICD-10-CM | POA: Diagnosis not present

## 2014-04-10 NOTE — Patient Instructions (Signed)
Your physician recommends that you continue on your current medications as directed. Please refer to the Current Medication list given to you today.  Your physician wants you to follow-up in: 1 year with Dr. Turner. You will receive a reminder letter in the mail two months in advance. If you don't receive a letter, please call our office to schedule the follow-up appointment.  

## 2014-04-10 NOTE — Progress Notes (Signed)
[place], [place] [place], [place]  [address] Phone: [phone] Fax:  [phone]  Date:  04/10/2014   ID:  Chase Martinez, DOB Mar 21, 1941, MRN [id number]  PCP:  Kathlene November, MD  Cardiologist:  Fransico Him, MD    History of Present Illness: Chase Martinez is a 73 y.o. male with a history of PAC's and HTN who presents today for followup. He is doing well. He denies any chest pain, SOB, DOE, LE edema, dizziness or syncope. Occasionally he will notice a skipped heart beat.   Wt Readings from Last 3 Encounters:  04/10/14 228 lb (103.42 kg)  03/07/14 226 lb 6 oz (102.683 kg)  10/30/13 223 lb 9.6 oz (101.424 kg)     Past Medical History  Diagnosis Date  . Diabetes mellitus   . Basal cell carcinoma     dr Ronnald Ramp  . Premature atrial contractions   . Hypertension   . Hyperlipidemia     Current Outpatient Prescriptions  Medication Sig Dispense Refill  . aspirin 81 MG tablet Take 81 mg by mouth daily.      Marland Kitchen atorvastatin (LIPITOR) 20 MG tablet TAKE 1 TABLET DAILY 90 tablet 1  . diltiazem (CARDIZEM LA) 240 MG 24 hr tablet TAKE 1 TABLET ONCE DAILY 90 tablet 3  . FREESTYLE LITE test strip USE TO CHECK BLOOD SUGAR ONCE DAILY 100 each 5  . furosemide (LASIX) 20 MG tablet TAKE 1 TABLET DAILY 90 tablet 2  . glimepiride (AMARYL) 2 MG tablet TAKE 1 TABLET DAILY BEFORE BREAKFAST 90 tablet 2  . JANUVIA 100 MG tablet TAKE 1 TABLET DAILY 90 tablet 1  . LANCETS ULTRA FINE MISC once. Use as directed to check blood sugars 3-4 times daily.    . metFORMIN (GLUCOPHAGE) 1000 MG tablet TAKE 1 TABLET TWICE A DAY WITH MEALS 180 tablet 2  . metoprolol succinate (TOPROL-XL) 100 MG 24 hr tablet TAKE 1 TABLET DAILY 90 tablet 1  . multivitamin (THERAGRAN) per tablet Take 1 tablet by mouth daily.      . Omega-3 Fatty Acids (FISH OIL) 1000 MG CAPS Take by mouth.      . tamsulosin (FLOMAX) 0.4 MG CAPS capsule TAKE 1 CAPSULE DAILY AFTER SUPPER 90 capsule 3   No current facility-administered medications  for this visit.    Allergies:   No Known Allergies  Social History:  The patient  reports that he quit smoking about 47 years ago. He quit smokeless tobacco use about 25 years ago. His smokeless tobacco use included Snuff. He reports that he drinks alcohol. He reports that he does not use illicit drugs.   Family History:  The patient's family history includes Dementia in his father; Glaucoma in his mother; Hypertension in his mother; Parkinsonism in his sister; Thyroid cancer in his son. There is no history of Coronary artery disease, Diabetes, Stroke, Prostate cancer, Colon cancer, Esophageal cancer, or Stomach cancer.   ROS:  Please see the history of present illness.      All other systems reviewed and negative.   PHYSICAL EXAM: VS:  BP 134/68 mmHg  Pulse 58  Ht 6\' 1"  (1.854 m)  Wt 228 lb (103.42 kg)  BMI 30.09 kg/m2 Well nourished, well developed, in no acute distress HEENT: normal Neck: no JVD Cardiac:  normal S1, S2; RRR; no murmur Lungs:  clear to auscultation bilaterally, no wheezing, rhonchi or rales Abd: soft, nontender, no hepatomegaly Ext: no edema Skin: warm and dry Neuro:  CNs 2-12 intact, no  focal abnormalities noted  EKG:  Sinus bradycardia     ASSESSMENT AND PLAN:  1. 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 HTN - well controlled - continue Diltiazem and metoprolol  Followup with me in 1 year   Signed, Fransico Him, MD [facility] 04/10/2014 8:29 AM

## 2014-06-19 ENCOUNTER — Other Ambulatory Visit: Payer: Self-pay | Admitting: Cardiology

## 2014-08-06 ENCOUNTER — Other Ambulatory Visit: Payer: Self-pay | Admitting: Internal Medicine

## 2014-08-06 DIAGNOSIS — E119 Type 2 diabetes mellitus without complications: Secondary | ICD-10-CM | POA: Diagnosis not present

## 2014-08-06 DIAGNOSIS — H40013 Open angle with borderline findings, low risk, bilateral: Secondary | ICD-10-CM | POA: Diagnosis not present

## 2014-08-06 DIAGNOSIS — Z961 Presence of intraocular lens: Secondary | ICD-10-CM | POA: Diagnosis not present

## 2014-08-06 DIAGNOSIS — H43813 Vitreous degeneration, bilateral: Secondary | ICD-10-CM | POA: Diagnosis not present

## 2014-08-06 LAB — HM DIABETES EYE EXAM

## 2014-08-11 ENCOUNTER — Other Ambulatory Visit: Payer: Self-pay | Admitting: Internal Medicine

## 2014-08-15 DIAGNOSIS — Z85828 Personal history of other malignant neoplasm of skin: Secondary | ICD-10-CM | POA: Diagnosis not present

## 2014-08-15 DIAGNOSIS — L570 Actinic keratosis: Secondary | ICD-10-CM | POA: Diagnosis not present

## 2014-08-15 DIAGNOSIS — L723 Sebaceous cyst: Secondary | ICD-10-CM | POA: Diagnosis not present

## 2014-08-15 DIAGNOSIS — L821 Other seborrheic keratosis: Secondary | ICD-10-CM | POA: Diagnosis not present

## 2014-08-15 DIAGNOSIS — B078 Other viral warts: Secondary | ICD-10-CM | POA: Diagnosis not present

## 2014-08-15 DIAGNOSIS — D225 Melanocytic nevi of trunk: Secondary | ICD-10-CM | POA: Diagnosis not present

## 2014-08-15 DIAGNOSIS — D2262 Melanocytic nevi of left upper limb, including shoulder: Secondary | ICD-10-CM | POA: Diagnosis not present

## 2014-09-03 ENCOUNTER — Encounter (HOSPITAL_COMMUNITY): Payer: Self-pay | Admitting: Emergency Medicine

## 2014-09-03 ENCOUNTER — Emergency Department (INDEPENDENT_AMBULATORY_CARE_PROVIDER_SITE_OTHER)
Admission: EM | Admit: 2014-09-03 | Discharge: 2014-09-03 | Disposition: A | Discharge status: Home / Self Care | Payer: Medicare Other | Source: Home / Self Care | Attending: Family Medicine | Admitting: Family Medicine

## 2014-09-03 DIAGNOSIS — S51812A Laceration without foreign body of left forearm, initial encounter: Secondary | ICD-10-CM | POA: Diagnosis not present

## 2014-09-03 MED ORDER — CEFUROXIME AXETIL 250 MG PO TABS: 250.0000 mg | ORAL_TABLET | Freq: Two times a day (BID) | ORAL | Status: DC

## 2014-09-03 NOTE — ED Provider Notes (Signed)
CSN: [id number]     Arrival date & time 09/03/14  1500 History   First MD Initiated Contact with Patient 09/03/14 1558     Chief Complaint  Patient presents with  . Puncture Wound   (Consider location/radiation/quality/duration/timing/severity/associated sxs/prior Treatment) HPI      74 year old male presents   for evaluation of a puncture/laceration of his left forearm. Earlier today he hit his arm on a gate and the piece of the handle stabbed into his arm. He immediately flushed it out with water and peroxide. Bleeding has been controlled with direct pressure. He denies any other injury. No numbness or weakness. His tetanus is up-to-date   Past Medical History  Diagnosis Date  . Diabetes mellitus   . Basal cell carcinoma     dr Ronnald Ramp  . Premature atrial contractions   . Hypertension   . Hyperlipidemia    Past Surgical History  Procedure Laterality Date  . Shoulder surgery      right  . Pilonidal cyst excision    . Tonsillectomy    . Cataract extraction Bilateral 2015   Family History  Problem Relation Age of Onset  . Coronary artery disease Neg Hx   . Diabetes Neg Hx   . Stroke Neg Hx   . Prostate cancer Neg Hx   . Colon cancer Neg Hx   . Esophageal cancer Neg Hx   . Stomach cancer Neg Hx   . Hypertension Mother   . Glaucoma Mother   . Dementia Father   . Thyroid cancer Son   . Parkinsonism Sister    History  Substance Use Topics  . Smoking status: Former Smoker    Quit date: 05/24/1966  . Smokeless tobacco: Former Systems developer    Types: Snuff    Quit date: 05/24/1988  . Alcohol Use: 0.0 oz/week     Comment: occasional beer and wine    Review of Systems  Skin: Positive for wound (see history of present illness).  All other systems reviewed and are negative.   Allergies  Review of patient's allergies indicates no known allergies.  Home Medications   Prior to Admission medications   Medication Sig Start Date End Date Taking? Authorizing Provider  aspirin 81  MG tablet Take 81 mg by mouth daily.     Yes Historical Provider, MD  atorvastatin (LIPITOR) 20 MG tablet TAKE 1 TABLET DAILY 08/12/14  Yes Colon Branch, MD  CARDIZEM LA 240 MG 24 hr tablet TAKE 1 TABLET DAILY 06/19/14  Yes Sueanne Margarita, MD  FREESTYLE LITE test strip USE TO CHECK BLOOD SUGAR ONCE DAILY 07/06/13  Yes Colon Branch, MD  furosemide (LASIX) 20 MG tablet TAKE 1 TABLET DAILY 11/15/13  Yes Colon Branch, MD  glimepiride (AMARYL) 2 MG tablet TAKE 1 TABLET DAILY BEFORE BREAKFAST 08/06/14  Yes Colon Branch, MD  JANUVIA 100 MG tablet TAKE 1 TABLET DAILY 03/25/14  Yes Colon Branch, MD  LANCETS ULTRA FINE MISC once. Use as directed to check blood sugars 3-4 times daily. 09/13/13  Yes Colon Branch, MD  metFORMIN (GLUCOPHAGE) 1000 MG tablet TAKE 1 TABLET TWICE A DAY WITH MEALS   Yes Colon Branch, MD  metoprolol succinate (TOPROL-XL) 100 MG 24 hr tablet TAKE 1 TABLET DAILY   Yes Colon Branch, MD  multivitamin [facility]) per tablet Take 1 tablet by mouth daily.     Yes Historical Provider, MD  Omega-3 Fatty Acids (FISH OIL) 1000 MG CAPS Take by mouth.  Yes Historical Provider, MD  tamsulosin (FLOMAX) 0.4 MG CAPS capsule TAKE 1 CAPSULE DAILY AFTER SUPPER 03/07/14  Yes Colon Branch, MD  cefUROXime (CEFTIN) 250 MG tablet Take 1 tablet (250 mg total) by mouth 2 (two) times daily with a meal. 09/03/14   Freeman Caldron Latonia Conrow, PA-C   BP 131/70 mmHg  Pulse 70  Temp(Src) 98 F (36.7 C) (Oral)  Resp 12  SpO2 98% Physical Exam  Constitutional: He is oriented to person, place, and time. He appears well-developed and well-nourished. No distress.  HENT:  Head: Normocephalic.  Pulmonary/Chest: Effort normal. No respiratory distress.  Musculoskeletal:       Left forearm: He exhibits laceration (there is a puncture/laceration, linear, 1 cm in length).  Neurological: He is alert and oriented to person, place, and time. Coordination normal.  Skin: Skin is warm and dry. No rash noted. He is not diaphoretic.  Psychiatric: He has a  normal mood and affect. Judgment normal.  Nursing note and vitals reviewed.   ED Course  LACERATION REPAIR Date/Time: 09/03/2014 7:05 PM Performed by: Liam Graham Authorized by: Ihor Gully D Consent: Verbal consent obtained. Risks and benefits: risks, benefits and alternatives were discussed Consent given by: patient Patient identity confirmed: verbally with patient Time out: Immediately prior to procedure a "time out" was called to verify the correct patient, procedure, equipment, support staff and site/side marked as required. Body area: upper extremity Location details: left lower arm Laceration length: 1.5 cm Foreign bodies: no foreign bodies Tendon involvement: none Nerve involvement: none Vascular damage: no Anesthesia: local infiltration Local anesthetic: lidocaine 2% with epinephrine Anesthetic total: 5 ml Patient sedated: no Preparation: Patient was prepped and draped in the usual sterile fashion. Irrigation solution: saline and tap water Irrigation method: tap and syringe Amount of cleaning: extensive Debridement: none Degree of undermining: none Skin closure: 4-0 Prolene Number of sutures: 3 Technique: simple Approximation: loose Approximation difficulty: simple Dressing: antibiotic ointment and 4x4 sterile gauze Patient tolerance: Patient tolerated the procedure well with no immediate complications Comments: The wound was anesthetized and then rinsed for 15 minutes under cool running water. It was then explored, the wound extends through the subcutaneous tissue but does not involve the musculature. It was rinsed again with 1 L of sterile water. It was then approximated loosely with 4-0 Prolene. Antibiotic ointment was applied and a sterile dressing. Follow-up in 10 days for suture removal   (including critical care time) Labs Review Labs Reviewed - No data to display  Imaging Review No results found.   MDM   1. Laceration of forearm, left, initial  encounter     laceration, extensively irrigated and repaired with 3 simple interrupted sutures. Follow-up in 10 days for removal  Meds ordered this encounter  Medications  . cefUROXime (CEFTIN) 250 MG tablet    Sig: Take 1 tablet (250 mg total) by mouth 2 (two) times daily with a meal.    Dispense:  14 tablet    Refill:  0       Liam Graham, PA-C 09/03/14 1907

## 2014-09-03 NOTE — Discharge Instructions (Signed)

## 2014-09-03 NOTE — ED Notes (Signed)
Pt has a puncture wound on the inside of his left forearm.  The wound came from a storm door latch earlier today.  Pt cleaned the wound and dressed it before arriving at the UC.

## 2014-09-10 ENCOUNTER — Ambulatory Visit (INDEPENDENT_AMBULATORY_CARE_PROVIDER_SITE_OTHER): Payer: Medicare Other | Admitting: Internal Medicine

## 2014-09-10 ENCOUNTER — Encounter: Payer: Self-pay | Admitting: Internal Medicine

## 2014-09-10 VITALS — BP 134/72 | HR 60 | Temp 98.0°F | Ht 73.0 in | Wt 227.5 lb

## 2014-09-10 DIAGNOSIS — E785 Hyperlipidemia, unspecified: Secondary | ICD-10-CM | POA: Diagnosis not present

## 2014-09-10 DIAGNOSIS — I10 Essential (primary) hypertension: Secondary | ICD-10-CM | POA: Diagnosis not present

## 2014-09-10 DIAGNOSIS — E119 Type 2 diabetes mellitus without complications: Secondary | ICD-10-CM | POA: Diagnosis not present

## 2014-09-10 LAB — CBC WITH DIFFERENTIAL/PLATELET
Basophils Absolute: 0 10*3/uL (ref 0.0–0.1)
Basophils Relative: 0.5 % (ref 0.0–3.0)
Eosinophils Absolute: 0.4 10*3/uL (ref 0.0–0.7)
Eosinophils Relative: 5.8 % — ABNORMAL HIGH (ref 0.0–5.0)
HCT: 37.5 % — ABNORMAL LOW (ref 39.0–52.0)
Hemoglobin: 12.8 g/dL — ABNORMAL LOW (ref 13.0–17.0)
Lymphocytes Relative: 23.8 % (ref 12.0–46.0)
Lymphs Abs: 1.6 10*3/uL (ref 0.7–4.0)
MCHC: 34.2 g/dL (ref 30.0–36.0)
MCV: 92.2 fl (ref 78.0–100.0)
Monocytes Absolute: 0.5 10*3/uL (ref 0.1–1.0)
Monocytes Relative: 7.4 % (ref 3.0–12.0)
Neutro Abs: 4.1 10*3/uL (ref 1.4–7.7)
Neutrophils Relative %: 62.5 % (ref 43.0–77.0)
Platelets: 268 10*3/uL (ref 150.0–400.0)
RBC: 4.07 Mil/uL — ABNORMAL LOW (ref 4.22–5.81)
RDW: 13.6 % (ref 11.5–15.5)
WBC: 6.6 10*3/uL (ref 4.0–10.5)

## 2014-09-10 LAB — BASIC METABOLIC PANEL
BUN: 17 mg/dL (ref 6–23)
CALCIUM: 9.3 mg/dL (ref 8.4–10.5)
CO2: 26 mEq/L (ref 19–32)
Chloride: 103 mEq/L (ref 96–112)
Creatinine, Ser: 0.9 mg/dL (ref 0.40–1.50)
GFR: 87.22 mL/min (ref >60.00)
GLUCOSE: 131 mg/dL — AB (ref 70–99)
Potassium: 4.5 mEq/L (ref 3.5–5.1)
SODIUM: 135 meq/L (ref 135–145)

## 2014-09-10 LAB — LIPID PANEL
Cholesterol: 120 mg/dL (ref 0–200)
HDL: 31.1 mg/dL — ABNORMAL LOW (ref >39.00)
LDL Cholesterol: 66 mg/dL (ref 0–99)
NonHDL: 88.9
Total CHOL/HDL Ratio: 4
Triglycerides: 113 mg/dL (ref 0.0–149.0)
VLDL: 22.6 mg/dL (ref 0.0–40.0)

## 2014-09-10 LAB — HEMOGLOBIN A1C: HEMOGLOBIN A1C: 6.9 % — AB (ref 4.6–6.5)

## 2014-09-10 LAB — MICROALBUMIN / CREATININE URINE RATIO
Creatinine,U: 131.8 mg/dL
Microalb Creat Ratio: 0.5 mg/g (ref 0.0–30.0)
Microalb, Ur: <0.7 mg/dL (ref 0.0–1.9)

## 2014-09-10 LAB — HM DIABETES FOOT EXAM: HM Diabetic Foot Exam: ABNORMAL

## 2014-09-10 MED ORDER — FUROSEMIDE 20 MG PO TABS: 20.0000 mg | ORAL_TABLET | Freq: Every day | ORAL | Status: DC

## 2014-09-10 MED ORDER — SITAGLIPTIN PHOSPHATE 100 MG PO TABS: 100.0000 mg | ORAL_TABLET | Freq: Every day | ORAL | Status: DC

## 2014-09-10 MED ORDER — METOPROLOL SUCCINATE ER 100 MG PO TB24: 100.0000 mg | ORAL_TABLET | Freq: Every day | ORAL | Status: DC

## 2014-09-10 MED ORDER — METFORMIN HCL 1000 MG PO TABS: 1000.0000 mg | ORAL_TABLET | Freq: Two times a day (BID) | ORAL | Status: DC

## 2014-09-10 NOTE — Assessment & Plan Note (Signed)
Currently well-controlled on Lipitor. Plan to recheck FLP today.

## 2014-09-10 NOTE — Progress Notes (Signed)
Subjective:    Patient ID: Chase Martinez, male    DOB: [birth year], 74 y.o.   MRN: [id number]  DOS:  09/10/2014 Type of visit - description : rov Interval history:  Patient is a 74 year old male with a history of hypertension, hyperlipidemia, and NIDDM in today for both a 6 month follow up and a post-ED office visit. He was seen in the Emergency Department on 09/03/14 for a 1 cm puncture laceration in his left forearm for which he received three stitches. Discharge instructions were to have stitches removed ten days after the ED visit, however patient is going out of town to [place], [place] for the next few days and will see an urgent care there for suture removal. Patient noted mild erythema and warmth in the arm a few days ago which has since resolved. Today he denies fever, chills, nausea, vomiting, or other constitutional symptoms.  Patient is seeing Dr. Radford Pax , last note reviewed, felt to be stable.  DM, Patient is having regular eye exams with the last one on 08/06/14. No retinopathy reported. Patient is checking blood sugar regularly for NIDDM with an average of 127 before breakfast. Mentions no complaints regarding medications. He does note occasional bilateral numbness and tingling just above the ankle.   Review of Systems  Constitutional: No fever, chills.  Respiratory: No difficulty breathing at rest or on exertion Cardiovascular: No CP or palpitations GI: no nausea, vomiting, diarrhea or abdominal pain.  No blood in the stools.    GU: No dysuria, gross hematuria, difficulty urinating. No urinary urgency or frequency. Neurological: No dizziness or syncope. No headaches. No diplopia, slurred speech, motor deficits, facial numbness Intermittent bilateral numbness and tingling around the ankle   Past Medical History  Diagnosis Date  . Diabetes mellitus   . Basal cell carcinoma     dr Ronnald Ramp  . Premature atrial contractions   . Hypertension   . Hyperlipidemia     Past Surgical  History  Procedure Laterality Date  . Shoulder surgery      right  . Pilonidal cyst excision    . Tonsillectomy    . Cataract extraction Bilateral 2015    History   Social History  . Marital Status: Married    Spouse Name: N/A  . Number of Children: 2  . Years of Education: N/A   Occupational History  . Retired Therapist, art Engineering geologist)    Social History Main Topics  . Smoking status: Former Smoker    Quit date: 05/24/1966  . Smokeless tobacco: Former Systems developer    Types: Snuff    Quit date: 05/24/1988  . Alcohol Use: 0.0 oz/week     Comment: occasional beer and wine  . Drug Use: No  . Sexual Activity: Not on file   Other Topics Concern  . Not on file   Social History Narrative                    Medication List       This list is accurate as of: 09/10/14  8:54 AM.  Always use your most recent med list.               aspirin 81 MG tablet  Take 81 mg by mouth daily.     atorvastatin 20 MG tablet  Commonly known as:  LIPITOR  TAKE 1 TABLET DAILY     CARDIZEM LA 240 MG 24 hr tablet  Generic drug:  diltiazem  TAKE 1 TABLET DAILY  cefUROXime 250 MG tablet  Commonly known as:  CEFTIN  Take 1 tablet (250 mg total) by mouth 2 (two) times daily with a meal.     Fish Oil 1000 MG Caps  Take by mouth.     FREESTYLE LITE test strip  Generic drug:  glucose blood  USE TO CHECK BLOOD SUGAR ONCE DAILY     furosemide 20 MG tablet  Commonly known as:  LASIX  TAKE 1 TABLET DAILY     glimepiride 2 MG tablet  Commonly known as:  AMARYL  TAKE 1 TABLET DAILY BEFORE BREAKFAST     JANUVIA 100 MG tablet  Generic drug:  sitaGLIPtin  TAKE 1 TABLET DAILY     LANCETS ULTRA FINE Misc  once. Use as directed to check blood sugars 3-4 times daily.     metFORMIN 1000 MG tablet  Commonly known as:  GLUCOPHAGE  TAKE 1 TABLET TWICE A DAY WITH MEALS     metoprolol succinate 100 MG 24 hr tablet  Commonly known as:  TOPROL-XL  TAKE 1 TABLET DAILY     multivitamin per tablet   Take 1 tablet by mouth daily.     tamsulosin 0.4 MG Caps capsule  Commonly known as:  FLOMAX  TAKE 1 CAPSULE DAILY AFTER SUPPER           Objective:   Physical Exam  Musculoskeletal:       Arms:  BP 134/72 mmHg  Pulse 60  Temp(Src) 98 F (36.7 C) (Oral)  Ht 6\' 1"  (1.854 m)  Wt 227 lb 8 oz (103.193 kg)  BMI 30.02 kg/m2  SpO2 96%  General:   Well developed, well nourished . NAD.  HEENT:  Normocephalic . Face symmetric, atraumatic Lungs:  CTA B Normal respiratory effort, no intercostal retractions, no accessory muscle use. Heart: RRR,  no murmur.  Muscle skeletal: L arm-- 1 cm puncture laceration w/ three stitches in place and 1 cm of surrounding erythema. No warmth or tenderness.no d/c or odor  Skin: Not pale. Not jaundice Neurologic:  alert & oriented X3.  Speech normal, gait appropriate for age and unassisted Psych--  Cognition and judgment appear intact.  Cooperative with normal attention span and concentration.  Behavior appropriate. No anxious or depressed appearing. DIABETIC FEET EXAM: +/+++ pretibial pitting edema bilaterally Normal pedal pulses bilaterally Skin remarkable for 2 cm dry blister on pad of left big toe, nails dystrophic, few calluses on sole of foot Pinprick examination of the feet normal.      Assessment & Plan:    Left Forearm Laceration: Wound appears to be healing appropriately, area is not warm and is nontender. Patient will have sutures removed in three days on his trip out of town. (wound is not completely healed today)  Plan: Remove sutures in three days at a local urgent care center. Patient instructed to contact us if area becomes inflamed, warm, tender, or has purulent drainage.

## 2014-09-10 NOTE — Assessment & Plan Note (Signed)
Well-controlled on current medications, last A1c 6.6% on 03/08/15. Diabetic foot exam completed and was slightly abnormal   Plan: Continue current regimen of medications, will recheck BMP, A1c, and microalbuminuria today. Patient instructed to return in 3-4 months for annual physical or sooner if needed. Feet exam completed today, mildly abnormal, see physical exam

## 2014-09-10 NOTE — Assessment & Plan Note (Signed)
  HTN:  Well-controlled on current medications, continue current regimen. Will check CBC today.

## 2014-09-10 NOTE — Progress Notes (Signed)
Pre visit review using our clinic review tool, if applicable. No additional management support is needed unless otherwise documented below in the visit note. 

## 2014-09-10 NOTE — Patient Instructions (Signed)
Get your blood work before you leave   Please go to a urgent care in 3-4 days to get the wound looked at, most likely the  stitches will be ready to come out  Come back to the office in 3-4 months   for a physical exam  Please schedule an appointment at the front desk    No fasting      Diabetes and Foot Care Diabetes may cause you to have problems because of poor blood supply (circulation) to your feet and legs. This may cause the skin on your feet to become thinner, break easier, and heal more slowly. Your skin may become dry, and the skin may peel and crack. You may also have nerve damage in your legs and feet causing decreased feeling in them. You may not notice minor injuries to your feet that could lead to infections or more serious problems. Taking care of your feet is one of the most important things you can do for yourself.  HOME CARE INSTRUCTIONS  Wear shoes at all times, even in the house. Do not go barefoot. Bare feet are easily injured.  Check your feet daily for blisters, cuts, and redness. If you cannot see the bottom of your feet, use a mirror or ask someone for help.  Wash your feet with warm water (do not use hot water) and mild soap. Then pat your feet and the areas between your toes until they are completely dry. Do not soak your feet as this can dry your skin.  Apply a moisturizing lotion or petroleum jelly (that does not contain alcohol and is unscented) to the skin on your feet and to dry, brittle toenails. Do not apply lotion between your toes.  Trim your toenails straight across. Do not dig under them or around the cuticle. File the edges of your nails with an emery board or nail file.  Do not cut corns or calluses or try to remove them with medicine.  Wear clean socks or stockings every day. Make sure they are not too tight. Do not wear knee-high stockings since they may decrease blood flow to your legs.  Wear shoes that fit properly and have enough cushioning. To  break in new shoes, wear them for just a few hours a day. This prevents you from injuring your feet. Always look in your shoes before you put them on to be sure there are no objects inside.  Do not cross your legs. This may decrease the blood flow to your feet.  If you find a minor scrape, cut, or break in the skin on your feet, keep it and the skin around it clean and dry. These areas may be cleansed with mild soap and water. Do not cleanse the area with peroxide, alcohol, or iodine.  When you remove an adhesive bandage, be sure not to damage the skin around it.  If you have a wound, look at it several times a day to make sure it is healing.  Do not use heating pads or hot water bottles. They may burn your skin. If you have lost feeling in your feet or legs, you may not know it is happening until it is too late.  Make sure your health care provider performs a complete foot exam at least annually or more often if you have foot problems. Report any cuts, sores, or bruises to your health care provider immediately. SEEK MEDICAL CARE IF:   You have an injury that is not healing.  You have cuts or breaks in the skin.  You have an ingrown nail.  You notice redness on your legs or feet.  You feel burning or tingling in your legs or feet.  You have pain or cramps in your legs and feet.  Your legs or feet are numb.  Your feet always feel cold. SEEK IMMEDIATE MEDICAL CARE IF:   There is increasing redness, swelling, or pain in or around a wound.  There is a red line that goes up your leg.  Pus is coming from a wound.  You develop a fever or as directed by your health care provider.  You notice a bad smell coming from an ulcer or wound. Document Released: 05/07/2000 Document Revised: 01/10/2013 Document Reviewed: 10/17/2012 [facility] Patient Information 2015 [place], [place]. This information is not intended to replace advice given to you by your health care provider. Make sure you  discuss any questions you have with your health care provider.

## 2014-09-14 DIAGNOSIS — Z4802 Encounter for removal of sutures: Secondary | ICD-10-CM | POA: Diagnosis not present

## 2014-09-16 ENCOUNTER — Telehealth: Payer: Self-pay | Admitting: Internal Medicine

## 2014-09-16 MED ORDER — METOPROLOL SUCCINATE ER 100 MG PO TB24
100.0000 mg | ORAL_TABLET | Freq: Every day | ORAL | Status: DC
Start: 2014-09-16 — End: 2015-09-16

## 2014-09-16 MED ORDER — METFORMIN HCL 1000 MG PO TABS: 1000.0000 mg | ORAL_TABLET | Freq: Two times a day (BID) | ORAL | Status: DC

## 2014-09-16 NOTE — Telephone Encounter (Signed)
Refills sent to [facility].

## 2014-09-16 NOTE — Telephone Encounter (Signed)
Refills were sent to wrong pharmacy, [facility]. metFORMIN (GLUCOPHAGE) 1000 MG tablet and metoprolol succinate

## 2014-10-17 ENCOUNTER — Other Ambulatory Visit: Payer: Self-pay | Admitting: Internal Medicine

## 2014-11-05 ENCOUNTER — Telehealth: Payer: Self-pay | Admitting: Internal Medicine

## 2014-11-05 ENCOUNTER — Other Ambulatory Visit: Payer: Self-pay

## 2014-11-05 NOTE — Addendum Note (Signed)
Addended by: Wilfrid Lund on: 11/05/2014 03:05 PM   Modules accepted: Orders, Medications

## 2014-11-05 NOTE — Telephone Encounter (Signed)
Already refilled to [facility] earlier today.

## 2014-11-05 NOTE — Telephone Encounter (Signed)
Caller name: Chase Martinez Relationship to patient: self  Can be reached: [phone] Pharmacy: [facility]  Reason for call: Pt states that he is needing to refill FREESTYLE LITE test strip. Pharmacy already requested as well.

## 2014-12-10 ENCOUNTER — Telehealth: Payer: Self-pay | Admitting: Internal Medicine

## 2014-12-10 NOTE — Telephone Encounter (Signed)
Pre visit letter mailed 12/09/14

## 2014-12-16 ENCOUNTER — Encounter: Payer: Medicare Other | Admitting: Internal Medicine

## 2014-12-23 DIAGNOSIS — L602 Onychogryphosis: Secondary | ICD-10-CM

## 2014-12-23 HISTORY — DX: Onychogryphosis: L60.2

## 2014-12-30 ENCOUNTER — Ambulatory Visit (INDEPENDENT_AMBULATORY_CARE_PROVIDER_SITE_OTHER): Payer: Medicare Other | Admitting: Internal Medicine

## 2014-12-30 ENCOUNTER — Encounter: Payer: Self-pay | Admitting: Internal Medicine

## 2014-12-30 VITALS — BP 126/74 | HR 68 | Temp 97.8°F | Ht 73.0 in | Wt 224.5 lb

## 2014-12-30 DIAGNOSIS — E119 Type 2 diabetes mellitus without complications: Secondary | ICD-10-CM

## 2014-12-30 DIAGNOSIS — E785 Hyperlipidemia, unspecified: Secondary | ICD-10-CM | POA: Diagnosis not present

## 2014-12-30 DIAGNOSIS — N400 Enlarged prostate without lower urinary tract symptoms: Secondary | ICD-10-CM

## 2014-12-30 DIAGNOSIS — Z0000 Encounter for general adult medical examination without abnormal findings: Secondary | ICD-10-CM | POA: Diagnosis not present

## 2014-12-30 DIAGNOSIS — L84 Corns and callosities: Secondary | ICD-10-CM

## 2014-12-30 DIAGNOSIS — I10 Essential (primary) hypertension: Secondary | ICD-10-CM

## 2014-12-30 LAB — HM DIABETES FOOT EXAM

## 2014-12-30 NOTE — Assessment & Plan Note (Signed)
Labs FLP is satisfactory

## 2014-12-30 NOTE — Assessment & Plan Note (Addendum)
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 Has a corn, needs treatment. Refer to podiatry

## 2014-12-30 NOTE — Progress Notes (Signed)
Pre visit review using our clinic review tool, if applicable. No additional management support is needed unless otherwise documented below in the visit note. 

## 2014-12-30 NOTE — Assessment & Plan Note (Signed)
Well controlled on Flomax.

## 2014-12-30 NOTE — Progress Notes (Signed)
Subjective:    Patient ID: Chase Martinez, male    DOB: 12-06-1940, 74 y.o.   MRN: [id number]  DOS:  12/30/2014 Type of visit - description :   Here for Medicare AWV:  1. Risk factors based on Past M, S, F history: reviewed 2. Physical Activities:  Golf x3-4/week, active, walks  3. Depression/mood: neg screening  4. Hearing:  No problems noted or reported  5. ADL's: independent, drives  6. Fall Risk: no recent falls, prevention discussed , see AVS 7. home Safety: does feel safe at home  8. Height, weight, & visual acuity: see VS, sees eye doctor regularly, s/p cataract surgery, doing well  9. Counseling: provided 10. Labs ordered based on risk factors: if needed  11. Referral Coordination: if needed 12. Care Plan, see assessment and plan , written personalized plan provided , see AVS 13. Cognitive Assessment: motor skills and cognition appropriate for age 40. Care team updated 15. End-of-life care , has a H-POA  In addition, today we discussed the following: Diabetes: Good compliance of medication, ambulatory blood sugars are consistently in the 120s before breakfast High cholesterol, good compliance with medication ambulatory BPs 130/60 High cholesterol: Good compliance with Lipitor. Has occasionally numbness and the lower extremities. Also has a callous on one of the right toes.  Review of Systems  Constitutional: No fever. No chills. No unexplained wt changes. No unusual sweats  HEENT: No dental problems, no ear discharge, no facial swelling, no voice changes. No eye discharge, no eye  redness , no  intolerance to light   Respiratory: No wheezing , no  difficulty breathing. No cough , no mucus production  Cardiovascular: No CP, no leg swelling , occ Palpitations at baseline  GI: no nausea, no vomiting, no diarrhea , no  abdominal pain.  No blood in the stools. No dysphagia, no odynophagia    Endocrine: No polyphagia, no polyuria , no polydipsia  GU: No dysuria,  gross hematuria, difficulty urinating. No urinary urgency, no frequency.  Musculoskeletal: No joint swellings or unusual aches or pains  Skin: No change in the color of the skin, palor , no  Rash  Allergic, immunologic: No environmental allergies , no  food allergies  Neurological: No dizziness no  syncope. No headaches. No diplopia, no slurred, no slurred speech, no motor deficits, no facial  Numbness  Hematological: No enlarged lymph nodes, no easy bruising , no unusual bleedings  Psychiatry: No suicidal ideas, no hallucinations, no beavior problems, no confusion.  No unusual/severe anxiety, no depression   Past Medical History  Diagnosis Date  . Diabetes mellitus   . Basal cell carcinoma     dr Ronnald Ramp  . Premature atrial contractions   . Hypertension   . Hyperlipidemia     Past Surgical History  Procedure Laterality Date  . Shoulder surgery      right  . Pilonidal cyst excision    . Tonsillectomy    . Cataract extraction Bilateral 2015    History   Social History  . Marital Status: Married    Spouse Name: N/A  . Number of Children: 2  . Years of Education: N/A   Occupational History  . Retired Therapist, art Engineering geologist)    Social History Main Topics  . Smoking status: Former Smoker    Quit date: 05/24/1966  . Smokeless tobacco: Former Systems developer    Types: Snuff    Quit date: 05/24/1988  . Alcohol Use: 0.0 oz/week     Comment: occasional beer  and wine  . Drug Use: No  . Sexual Activity: Not on file   Other Topics Concern  . Not on file   Social History Narrative   Household-- pt and wife    Son lives near by, daughter near [place]        Family History  Problem Relation Age of Onset  . Coronary artery disease Neg Hx   . Diabetes Neg Hx   . Stroke Neg Hx   . Prostate cancer Neg Hx   . Colon cancer Neg Hx   . Esophageal cancer Neg Hx   . Stomach cancer Neg Hx   . Hypertension Mother   . Glaucoma Mother   . Dementia Father   . Thyroid cancer Son   .  Parkinsonism Sister        Medication List       This list is accurate as of: 12/30/14 11:59 PM.  Always use your most recent med list.               aspirin 81 MG tablet  Take 81 mg by mouth daily.     atorvastatin 20 MG tablet  Commonly known as:  LIPITOR  TAKE 1 TABLET DAILY     CARDIZEM LA 240 MG 24 hr tablet  Generic drug:  diltiazem  TAKE 1 TABLET DAILY     Fish Oil 1000 MG Caps  Take by mouth.     furosemide 20 MG tablet  Commonly known as:  LASIX  Take 1 tablet (20 mg total) by mouth daily.     glimepiride 2 MG tablet  Commonly known as:  AMARYL  TAKE 1 TABLET DAILY BEFORE BREAKFAST     glucose blood test strip  Commonly known as:  FREESTYLE LITE  Check blood sugar no more than twice daily.     LANCETS ULTRA FINE Misc  once. Use as directed to check blood sugars 3-4 times daily.     metFORMIN 1000 MG tablet  Commonly known as:  GLUCOPHAGE  Take 1 tablet (1,000 mg total) by mouth 2 (two) times daily with a meal.     metoprolol succinate 100 MG 24 hr tablet  Commonly known as:  TOPROL-XL  Take 1 tablet (100 mg total) by mouth daily. Take with or immediately following a meal.     multivitamin per tablet  Take 1 tablet by mouth daily.     sitaGLIPtin 100 MG tablet  Commonly known as:  JANUVIA  Take 1 tablet (100 mg total) by mouth daily.     tamsulosin 0.4 MG Caps capsule  Commonly known as:  FLOMAX  TAKE 1 CAPSULE DAILY AFTER SUPPER           Objective:   Physical Exam BP 126/74 mmHg  Pulse 68  Temp(Src) 97.8 F (36.6 C) (Oral)  Ht 6\' 1"  (1.854 m)  Wt 224 lb 8 oz (101.833 kg)  BMI 29.63 kg/m2  SpO2 97% General:   Well developed, well nourished . NAD.  HEENT:  Normocephalic . Face symmetric, atraumatic Neck: No thyromegaly, normal carotid pulses Lungs:  CTA B Normal respiratory effort, no intercostal retractions, no accessory muscle use. Heart: RRR,  no murmur.  no pretibial edema bilaterally  Abdomen:  Not distended, soft,  non-tender. No rebound or rigidity. Abdominal rectus diastasis noted. No bruit. Diabetic feet exam: Has a large corn in one of his right toes without evidence of cellulitis. Pedal pulses wnl, thick nails, no edema, pinprick examination negative Neurologic:  alert & oriented X3.  Speech normal, gait appropriate for age and unassisted Psych--  Cognition and judgment appear intact.  Cooperative with normal attention span and concentration.  Behavior appropriate. No anxious or depressed appearing.     Assessment & Plan:

## 2014-12-30 NOTE — Patient Instructions (Signed)
Get your blood work before you leave    Diabetes and Foot Care Diabetes may cause you to have problems because of poor blood supply (circulation) to your feet and legs. This may cause the skin on your feet to become thinner, break easier, and heal more slowly. Your skin may become dry, and the skin may peel and crack. You may also have nerve damage in your legs and feet causing decreased feeling in them. You may not notice minor injuries to your feet that could lead to infections or more serious problems. Taking care of your feet is one of the most important things you can do for yourself.  HOME CARE INSTRUCTIONS  Wear shoes at all times, even in the house. Do not go barefoot. Bare feet are easily injured.  Check your feet daily for blisters, cuts, and redness. If you cannot see the bottom of your feet, use a mirror or ask someone for help.  Wash your feet with warm water (do not use hot water) and mild soap. Then pat your feet and the areas between your toes until they are completely dry. Do not soak your feet as this can dry your skin.  Apply a moisturizing lotion or petroleum jelly (that does not contain alcohol and is unscented) to the skin on your feet and to dry, brittle toenails. Do not apply lotion between your toes.  Trim your toenails straight across. Do not dig under them or around the cuticle. File the edges of your nails with an emery board or nail file.  Do not cut corns or calluses or try to remove them with medicine.  Wear clean socks or stockings every day. Make sure they are not too tight. Do not wear knee-high stockings since they may decrease blood flow to your legs.  Wear shoes that fit properly and have enough cushioning. To break in new shoes, wear them for just a few hours a day. This prevents you from injuring your feet. Always look in your shoes before you put them on to be sure there are no objects inside.  Do not cross your legs. This may decrease the blood flow to  your feet.  If you find a minor scrape, cut, or break in the skin on your feet, keep it and the skin around it clean and dry. These areas may be cleansed with mild soap and water. Do not cleanse the area with peroxide, alcohol, or iodine.  When you remove an adhesive bandage, be sure not to damage the skin around it.  If you have a wound, look at it several times a day to make sure it is healing.  Do not use heating pads or hot water bottles. They may burn your skin. If you have lost feeling in your feet or legs, you may not know it is happening until it is too late.  Make sure your health care provider performs a complete foot exam at least annually or more often if you have foot problems. Report any cuts, sores, or bruises to your health care provider immediately. SEEK MEDICAL CARE IF:   You have an injury that is not healing.  You have cuts or breaks in the skin.  You have an ingrown nail.  You notice redness on your legs or feet.  You feel burning or tingling in your legs or feet.  You have pain or cramps in your legs and feet.  Your legs or feet are numb.  Your feet always feel cold. SEEK  IMMEDIATE MEDICAL CARE IF:   There is increasing redness, swelling, or pain in or around a wound.  There is a red line that goes up your leg.  Pus is coming from a wound.  You develop a fever or as directed by your health care provider.  You notice a bad smell coming from an ulcer or wound. Document Released: 05/07/2000 Document Revised: 01/10/2013 Document Reviewed: 10/17/2012 [facility] Patient Information 2015 [place], [place]. This information is not intended to replace advice given to you by your health care provider. Make sure you discuss any questions you have with your health care provider.    Fall Prevention and Home Safety Falls cause injuries and can affect all age groups. It is possible to use preventive measures to significantly decrease the likelihood of falls. There are  many simple measures which can make your home safer and prevent falls. OUTDOORS  Repair cracks and edges of walkways and driveways.  Remove high doorway thresholds.  Trim shrubbery on the main path into your home.  Have good outside lighting.  Clear walkways of tools, rocks, debris, and clutter.  Check that handrails are not broken and are securely fastened. Both sides of steps should have handrails.  Have leaves, snow, and ice cleared regularly.  Use sand or salt on walkways during winter months.  In the garage, clean up grease or oil spills. BATHROOM  Install night lights.  Install grab bars by the toilet and in the tub and shower.  Use non-skid mats or decals in the tub or shower.  Place a plastic non-slip stool in the shower to sit on, if needed.  Keep floors dry and clean up all water on the floor immediately.  Remove soap buildup in the tub or shower on a regular basis.  Secure bath mats with non-slip, double-sided rug tape.  Remove throw rugs and tripping hazards from the floors. BEDROOMS  Install night lights.  Make sure a bedside light is easy to reach.  Do not use oversized bedding.  Keep a telephone by your bedside.  Have a firm chair with side arms to use for getting dressed.  Remove throw rugs and tripping hazards from the floor. KITCHEN  Keep handles on pots and pans turned toward the center of the stove. Use back burners when possible.  Clean up spills quickly and allow time for drying.  Avoid walking on wet floors.  Avoid hot utensils and knives.  Position shelves so they are not too high or low.  Place commonly used objects within easy reach.  If necessary, use a sturdy step stool with a grab bar when reaching.  Keep electrical cables out of the way.  Do not use floor polish or wax that makes floors slippery. If you must use wax, use non-skid floor wax.  Remove throw rugs and tripping hazards from the floor. STAIRWAYS  Never  leave objects on stairs.  Place handrails on both sides of stairways and use them. Fix any loose handrails. Make sure handrails on both sides of the stairways are as long as the stairs.  Check carpeting to make sure it is firmly attached along stairs. Make repairs to worn or loose carpet promptly.  Avoid placing throw rugs at the top or bottom of stairways, or properly secure the rug with carpet tape to prevent slippage. Get rid of throw rugs, if possible.  Have an electrician put in a light switch at the top and bottom of the stairs. OTHER FALL PREVENTION TIPS  Wear  low-heel or rubber-soled shoes that are supportive and fit well. Wear closed toe shoes.  When using a stepladder, make sure it is fully opened and both spreaders are firmly locked. Do not climb a closed stepladder.  Add color or contrast paint or tape to grab bars and handrails in your home. Place contrasting color strips on first and last steps.  Learn and use mobility aids as needed. Install an electrical emergency response system.  Turn on lights to avoid dark areas. Replace light bulbs that burn out immediately. Get light switches that glow.  Arrange furniture to create clear pathways. Keep furniture in the same place.  Firmly attach carpet with non-skid or double-sided tape.  Eliminate uneven floor surfaces.  Select a carpet pattern that does not visually hide the edge of steps.  Be aware of all pets. OTHER HOME SAFETY TIPS  Set the water temperature for 120 F (48.8 C).  Keep emergency numbers on or near the telephone.  Keep smoke detectors on every level of the home and near sleeping areas. Document Released: 04/30/2002 Document Revised: 11/09/2011 Document Reviewed: 07/30/2011 [facility] Patient Information 2015 [place], [place]. This information is not intended to replace advice given to you by your health care provider. Make sure you discuss any questions you have with your health care  provider.   Preventive Care for Adults Ages 39 and over  Blood pressure check.** / Every 1 to 2 years.  Lipid and cholesterol check.**/ Every 5 years beginning at age 20.  Lung cancer screening. / Every year if you are aged 24-80 years and have a 30-pack-year history of smoking and currently smoke or have quit within the past 15 years. Yearly screening is stopped once you have quit smoking for at least 15 years or develop a health problem that would prevent you from having lung cancer treatment.  Fecal occult blood test (FOBT) of stool. / Every year beginning at age 39 and continuing until age 67. You may not have to do this test if you get a colonoscopy every 10 years.  Flexible sigmoidoscopy** or colonoscopy.** / Every 5 years for a flexible sigmoidoscopy or every 10 years for a colonoscopy beginning at age 36 and continuing until age 85.  Hepatitis C blood test.** / For all people born from [birth year] through 1967 and any individual with known risks for hepatitis C.  Abdominal aortic aneurysm (AAA) screening.** / A one-time screening for ages 10 to 19 years who are current or former smokers.  Skin self-exam. / Monthly.  Influenza vaccine. / Every year.  Tetanus, diphtheria, and acellular pertussis (Tdap/Td) vaccine.** / 1 dose of Td every 10 years.  Varicella vaccine.** / Consult your health care provider.  Zoster vaccine.** / 1 dose for adults aged 49 years or older.  Pneumococcal 13-valent conjugate (PCV13) vaccine.** / Consult your health care provider.  Pneumococcal polysaccharide (PPSV23) vaccine.** / 1 dose for all adults aged 53 years and older.  Meningococcal vaccine.** / Consult your health care provider.  Hepatitis A vaccine.** / Consult your health care provider.  Hepatitis B vaccine.** / Consult your health care provider.  Haemophilus influenzae type b (Hib) vaccine.** / Consult your health care provider. **Family history and personal history of risk and conditions  may change your health care provider's recommendations. Document Released: 07/06/2001 Document Revised: 05/15/2013 Document Reviewed: 10/05/2010 [facility] Patient Information 2015 [place], [place]. This information is not intended to replace advice given to you by your health care provider. Make sure you discuss any questions you  have with your health care provider.

## 2014-12-30 NOTE — Assessment & Plan Note (Addendum)
Immunizations: Tdap 2013, Pneumonia shot 5-09; prevnar 2015, had shingles shot 2009 aprox Prostate Ca screening: consitently normal PSAs, reassess next year CCS: Cscope--  Dr Vladimir Faster in 2002 ,another Cscope 3-13, 1 polyp, next 5 years Diet-exercise discussed

## 2014-12-30 NOTE — Assessment & Plan Note (Signed)
Seems very well controlled, check a BMP and CBC, last hemoglobin was a slightly low

## 2014-12-31 LAB — CBC WITH DIFFERENTIAL/PLATELET
Basophils Absolute: 0 10*3/uL (ref 0.0–0.1)
Basophils Relative: 0.6 % (ref 0.0–3.0)
EOS ABS: 0.4 10*3/uL (ref 0.0–0.7)
Eosinophils Relative: 4.3 % (ref 0.0–5.0)
HEMATOCRIT: 38 % — AB (ref 39.0–52.0)
Hemoglobin: 12.7 g/dL — ABNORMAL LOW (ref 13.0–17.0)
Lymphocytes Relative: 18.7 % (ref 12.0–46.0)
Lymphs Abs: 1.5 10*3/uL (ref 0.7–4.0)
MCHC: 33.5 g/dL (ref 30.0–36.0)
MCV: 94.5 fl (ref 78.0–100.0)
MONO ABS: 0.6 10*3/uL (ref 0.1–1.0)
Monocytes Relative: 7.4 % (ref 3.0–12.0)
Neutro Abs: 5.7 10*3/uL (ref 1.4–7.7)
Neutrophils Relative %: 69 % (ref 43.0–77.0)
Platelets: 256 10*3/uL (ref 150.0–400.0)
RBC: 4.02 Mil/uL — ABNORMAL LOW (ref 4.22–5.81)
RDW: 14.1 % (ref 11.5–15.5)
WBC: 8.3 10*3/uL (ref 4.0–10.5)

## 2014-12-31 LAB — BASIC METABOLIC PANEL
BUN: 17 mg/dL (ref 6–23)
CHLORIDE: 101 meq/L (ref 96–112)
CO2: 27 mEq/L (ref 19–32)
Calcium: 9.4 mg/dL (ref 8.4–10.5)
Creatinine, Ser: 0.97 mg/dL (ref 0.40–1.50)
GFR: 79.93 mL/min (ref >60.00)
Glucose, Bld: 182 mg/dL — ABNORMAL HIGH (ref 70–99)
POTASSIUM: 3.9 meq/L (ref 3.5–5.1)
Sodium: 136 mEq/L (ref 135–145)

## 2014-12-31 LAB — ALT: ALT: 50 U/L (ref 0–53)

## 2014-12-31 LAB — HEMOGLOBIN A1C: Hgb A1c MFr Bld: 6.6 % — ABNORMAL HIGH (ref 4.6–6.5)

## 2014-12-31 LAB — AST: AST: 40 U/L — ABNORMAL HIGH (ref 0–37)

## 2015-01-01 ENCOUNTER — Encounter: Payer: Self-pay | Admitting: Cardiology

## 2015-01-01 DIAGNOSIS — L602 Onychogryphosis: Secondary | ICD-10-CM | POA: Diagnosis not present

## 2015-01-01 DIAGNOSIS — L84 Corns and callosities: Secondary | ICD-10-CM | POA: Diagnosis not present

## 2015-01-01 DIAGNOSIS — E119 Type 2 diabetes mellitus without complications: Secondary | ICD-10-CM | POA: Diagnosis not present

## 2015-01-01 DIAGNOSIS — M2021 Hallux rigidus, right foot: Secondary | ICD-10-CM | POA: Diagnosis not present

## 2015-01-01 LAB — HM DIABETES FOOT EXAM

## 2015-02-01 ENCOUNTER — Other Ambulatory Visit: Payer: Self-pay | Admitting: Internal Medicine

## 2015-02-11 DIAGNOSIS — H40013 Open angle with borderline findings, low risk, bilateral: Secondary | ICD-10-CM | POA: Diagnosis not present

## 2015-02-13 DIAGNOSIS — L570 Actinic keratosis: Secondary | ICD-10-CM | POA: Diagnosis not present

## 2015-02-13 DIAGNOSIS — Z85828 Personal history of other malignant neoplasm of skin: Secondary | ICD-10-CM | POA: Diagnosis not present

## 2015-02-13 DIAGNOSIS — L814 Other melanin hyperpigmentation: Secondary | ICD-10-CM | POA: Diagnosis not present

## 2015-02-13 DIAGNOSIS — L720 Epidermal cyst: Secondary | ICD-10-CM | POA: Diagnosis not present

## 2015-02-13 DIAGNOSIS — L821 Other seborrheic keratosis: Secondary | ICD-10-CM | POA: Diagnosis not present

## 2015-02-20 ENCOUNTER — Other Ambulatory Visit: Payer: Self-pay | Admitting: Internal Medicine

## 2015-03-06 DIAGNOSIS — Z23 Encounter for immunization: Secondary | ICD-10-CM | POA: Diagnosis not present

## 2015-03-18 DIAGNOSIS — L84 Corns and callosities: Secondary | ICD-10-CM | POA: Diagnosis not present

## 2015-03-18 DIAGNOSIS — L602 Onychogryphosis: Secondary | ICD-10-CM | POA: Diagnosis not present

## 2015-03-18 DIAGNOSIS — E1151 Type 2 diabetes mellitus with diabetic peripheral angiopathy without gangrene: Secondary | ICD-10-CM | POA: Diagnosis not present

## 2015-03-20 ENCOUNTER — Other Ambulatory Visit: Payer: Self-pay | Admitting: Internal Medicine

## 2015-04-10 ENCOUNTER — Encounter: Payer: Self-pay | Admitting: Cardiology

## 2015-04-10 ENCOUNTER — Ambulatory Visit (INDEPENDENT_AMBULATORY_CARE_PROVIDER_SITE_OTHER): Payer: Medicare Other | Admitting: Cardiology

## 2015-04-10 VITALS — BP 104/56 | HR 57 | Ht 73.0 in | Wt 226.4 lb

## 2015-04-10 DIAGNOSIS — I10 Essential (primary) hypertension: Secondary | ICD-10-CM | POA: Diagnosis not present

## 2015-04-10 DIAGNOSIS — I491 Atrial premature depolarization: Secondary | ICD-10-CM

## 2015-04-10 NOTE — Patient Instructions (Signed)

## 2015-04-10 NOTE — Progress Notes (Signed)
Cardiology Office Note   Date:  04/10/2015   ID:  CLAIRE TOLHURST, DOB 26-Jan-1941, MRN [id number]  PCP:  Kathlene November, MD    Chief Complaint  Patient presents with  . Hypertension      History of Present Illness: Chase Martinez is a 74 y.o. male with a history of PAC's and HTN who presents today for followup. He is doing well. He denies any chest pain, SOB, DOE, LE edema, dizziness or syncope. Occasionally he will notice a skipped heart beat.    Past Medical History  Diagnosis Date  . Diabetes mellitus   . Basal cell carcinoma     dr Ronnald Ramp  . Premature atrial contractions   . Hypertension   . Hyperlipidemia   . 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 Shoulder surgery      right  . Pilonidal cyst excision    . Tonsillectomy    . Cataract extraction Bilateral 2015     Current Outpatient Prescriptions  Medication Sig Dispense Refill  . aspirin 81 MG tablet Take 81 mg by mouth daily.      Marland Kitchen atorvastatin (LIPITOR) 20 MG tablet Take 1 tablet (20 mg total) by mouth daily. 90 tablet 2  . diltiazem (CARDIZEM LA) 240 MG 24 hr tablet Take 240 mg by mouth daily.    . furosemide (LASIX) 20 MG tablet Take 1 tablet (20 mg total) by mouth daily. 90 tablet 3  . glimepiride (AMARYL) 2 MG tablet Take 1 tablet (2 mg total) by mouth daily before breakfast. 90 tablet 1  . glucose blood (FREESTYLE LITE) test strip Check blood sugar no more than twice daily. 100 each 12  . LANCETS ULTRA FINE MISC once. Use as directed to check blood sugars 3-4 times daily.    . metFORMIN (GLUCOPHAGE) 1000 MG tablet Take 1 tablet (1,000 mg total) by mouth 2 (two) times daily with a meal. 180 tablet 3  . metoprolol succinate (TOPROL-XL) 100 MG 24 hr tablet Take 1 tablet (100 mg total) by mouth daily. Take with or immediately following a meal. 90 tablet 3  . multivitamin (THERAGRAN)  per tablet Take 1 tablet by mouth daily.      . Omega-3 Fatty Acids (FISH OIL) 1000 MG CAPS Take 1 capsule by mouth daily.     . sitaGLIPtin (JANUVIA) 100 MG tablet Take 1 tablet (100 mg total) by mouth daily. 90 tablet 3  . tamsulosin (FLOMAX) 0.4 MG CAPS capsule Take 1 capsule (0.4 mg total) by mouth daily after supper. 90 capsule 3   No current facility-administered medications for this visit.    Allergies:   Review of patient's allergies indicates no known allergies.    Social History:  The patient  reports that he quit smoking about 48 years ago. He quit smokeless tobacco use about 26 years ago. His smokeless tobacco use included Snuff. He reports that he drinks alcohol. He reports that he does not use illicit drugs.   Family History:  The patient's family history includes Dementia in his father; Glaucoma in his mother; Hypertension in his mother; Parkinsonism in his sister; Thyroid cancer in his son. There is no history of Coronary artery disease, Diabetes, Stroke, Prostate cancer, Colon cancer, Esophageal cancer, or Stomach cancer.    ROS:  Please see  the history of present illness.   Otherwise, review of systems are positive for none.   All other systems are reviewed and negative.    PHYSICAL EXAM: VS:  BP 104/56 mmHg  Pulse 57  Ht 6\' 1"  (1.854 m)  Wt 102.694 kg (226 lb 6.4 oz)  BMI 29.88 kg/m2 , BMI Body mass index is 29.88 kg/(m^2). GEN: Well nourished, well developed, in no acute distress HEENT: normal Neck: no JVD, carotid bruits, or masses Cardiac: RRR; no murmurs, rubs, or gallops,no edema  Respiratory:  clear to auscultation bilaterally, normal work of breathing GI: soft, nontender, nondistended, + BS MS: no deformity or atrophy Skin: warm and dry, no rash Neuro:  Strength and sensation are intact Psych: euthymic mood, full affect   EKG:  EKG is ordered today. The ekg ordered today demonstrates sinus bradycardia at 57bpm with no ST changes   Recent  Labs: 12/30/2014: ALT 50; BUN 17; Creatinine, Ser 0.97; Hemoglobin 12.7*; Platelets 256.0; Potassium 3.9; Sodium 136    Lipid Panel    Component Value Date/Time   CHOL 120 09/10/2014 0910   TRIG 113.0 09/10/2014 0910   TRIG 74 05/31/2006 0933   HDL 31.10* 09/10/2014 0910   CHOLHDL 4 09/10/2014 0910   CHOLHDL 3.8 CALC 05/31/2006 0933   VLDL 22.6 09/10/2014 0910   LDLCALC 66 09/10/2014 0910      Wt Readings from Last 3 Encounters:  04/10/15 102.694 kg (226 lb 6.4 oz)  12/30/14 101.833 kg (224 lb 8 oz)  09/10/14 103.193 kg (227 lb 8 oz)     ASSESSMENT AND PLAN:  1. 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 HTN - well controlled - continue Diltiazem and metoprolol   Current medicines are reviewed at length with the patient today.  The patient does not have concerns regarding medicines.  The following changes have been made:  no change  Labs/ tests ordered today: See above Assessment and Plan No orders of the defined types were placed in this encounter.     Disposition:   FU with me in 1 year  Signed, Sueanne Margarita, MD  04/10/2015 8:17 AM    [facility] [address], [place], [place]  [address] Phone: [phone]; Fax: [phone]

## 2015-04-10 NOTE — Addendum Note (Signed)
Addended by: Fransico Him R on: 04/10/2015 09:00 AM   Modules accepted: SmartSet

## 2015-04-20 ENCOUNTER — Other Ambulatory Visit: Payer: Self-pay | Admitting: Cardiology

## 2015-04-30 ENCOUNTER — Other Ambulatory Visit: Payer: Self-pay

## 2015-04-30 MED ORDER — LANCETS ULTRA FINE MISC: Status: DC

## 2015-05-29 DIAGNOSIS — E1351 Other specified diabetes mellitus with diabetic peripheral angiopathy without gangrene: Secondary | ICD-10-CM | POA: Diagnosis not present

## 2015-05-29 DIAGNOSIS — L84 Corns and callosities: Secondary | ICD-10-CM | POA: Diagnosis not present

## 2015-05-29 DIAGNOSIS — L602 Onychogryphosis: Secondary | ICD-10-CM | POA: Diagnosis not present

## 2015-07-03 ENCOUNTER — Ambulatory Visit (INDEPENDENT_AMBULATORY_CARE_PROVIDER_SITE_OTHER): Payer: Medicare Other | Admitting: Internal Medicine

## 2015-07-03 ENCOUNTER — Encounter: Payer: Self-pay | Admitting: Internal Medicine

## 2015-07-03 VITALS — BP 122/60 | HR 59 | Temp 98.2°F | Ht 73.0 in | Wt 225.0 lb

## 2015-07-03 DIAGNOSIS — I10 Essential (primary) hypertension: Secondary | ICD-10-CM

## 2015-07-03 DIAGNOSIS — E119 Type 2 diabetes mellitus without complications: Secondary | ICD-10-CM

## 2015-07-03 DIAGNOSIS — D649 Anemia, unspecified: Secondary | ICD-10-CM | POA: Diagnosis not present

## 2015-07-03 DIAGNOSIS — E1140 Type 2 diabetes mellitus with diabetic neuropathy, unspecified: Secondary | ICD-10-CM | POA: Diagnosis not present

## 2015-07-03 DIAGNOSIS — E785 Hyperlipidemia, unspecified: Secondary | ICD-10-CM | POA: Diagnosis not present

## 2015-07-03 LAB — LIPID PANEL
CHOL/HDL RATIO: 4
Cholesterol: 123 mg/dL (ref 0–200)
HDL: 35 mg/dL — AB (ref >39.00)
LDL Cholesterol: 68 mg/dL (ref 0–99)
NONHDL: 87.59
Triglycerides: 99 mg/dL (ref 0.0–149.0)
VLDL: 19.8 mg/dL (ref 0.0–40.0)

## 2015-07-03 LAB — CBC WITH DIFFERENTIAL/PLATELET
BASOS ABS: 0 10*3/uL (ref 0.0–0.1)
Basophils Relative: 0.6 % (ref 0.0–3.0)
EOS ABS: 0.4 10*3/uL (ref 0.0–0.7)
Eosinophils Relative: 5.6 % — ABNORMAL HIGH (ref 0.0–5.0)
HCT: 38.7 % — ABNORMAL LOW (ref 39.0–52.0)
Hemoglobin: 12.8 g/dL — ABNORMAL LOW (ref 13.0–17.0)
Lymphocytes Relative: 25.7 % (ref 12.0–46.0)
Lymphs Abs: 1.8 10*3/uL (ref 0.7–4.0)
MCHC: 33.2 g/dL (ref 30.0–36.0)
MCV: 94.3 fl (ref 78.0–100.0)
Monocytes Absolute: 0.5 10*3/uL (ref 0.1–1.0)
Monocytes Relative: 7.9 % (ref 3.0–12.0)
NEUTROS ABS: 4.2 10*3/uL (ref 1.4–7.7)
Neutrophils Relative %: 60.2 % (ref 43.0–77.0)
PLATELETS: 253 10*3/uL (ref 150.0–400.0)
RBC: 4.1 Mil/uL — ABNORMAL LOW (ref 4.22–5.81)
RDW: 13.4 % (ref 11.5–15.5)
WBC: 6.9 10*3/uL (ref 4.0–10.5)

## 2015-07-03 LAB — BASIC METABOLIC PANEL
BUN: 20 mg/dL (ref 6–23)
CO2: 26 meq/L (ref 19–32)
Calcium: 9.4 mg/dL (ref 8.4–10.5)
Chloride: 102 mEq/L (ref 96–112)
Creatinine, Ser: 0.99 mg/dL (ref 0.40–1.50)
GFR: 77.97 mL/min (ref >60.00)
GLUCOSE: 149 mg/dL — AB (ref 70–99)
Potassium: 4.5 mEq/L (ref 3.5–5.1)
Sodium: 134 mEq/L — ABNORMAL LOW (ref 135–145)

## 2015-07-03 LAB — IRON: IRON: 81 ug/dL (ref 42–165)

## 2015-07-03 LAB — HEMOGLOBIN A1C: Hgb A1c MFr Bld: 7 % — ABNORMAL HIGH (ref 4.6–6.5)

## 2015-07-03 LAB — FERRITIN: Ferritin: 84.1 ng/mL (ref 22.0–322.0)

## 2015-07-03 NOTE — Progress Notes (Signed)
Pre visit review using our clinic review tool, if applicable. No additional management support is needed unless otherwise documented below in the visit note. 

## 2015-07-03 NOTE — Patient Instructions (Addendum)
GO TO THE LAB : Get the blood work    GO TO THE FRONT DESK Schedule a complete physical exam to be done in 6 months  no fasting

## 2015-07-03 NOTE — Progress Notes (Signed)
Subjective:    Patient ID: Chase Martinez, male    DOB: [birth year], 75 y.o.   MRN: [id number]  DOS:  07/03/2015 Type of visit - description : Routine office visit Interval history:  DM: Good compliance of medication, ambulatory blood sugars in the morning fasting average 127. HTN: Good med compliance, normal BP. Due for labs Dyslipidemia: good  compliance, due for a FLP.  Review of Systems Denies chest pain difficulty breathing No nausea, vomiting, diarrhea  Past Medical History  Diagnosis Date  . Diabetes mellitus   . Basal cell carcinoma     dr Ronnald Ramp  . Premature atrial contractions   . Hypertension   . Hyperlipidemia   . 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 Shoulder surgery      right  . Pilonidal cyst excision    . Tonsillectomy    . Cataract extraction Bilateral 2015    Social History   Social History  . Marital Status: Married    Spouse Name: N/A  . Number of Children: 2  . Years of Education: N/A   Occupational History  . Retired Therapist, art Engineering geologist)    Social History Main Topics  . Smoking status: Former Smoker    Quit date: 05/24/1966  . Smokeless tobacco: Former Systems developer    Types: Snuff    Quit date: 05/24/1988  . Alcohol Use: 0.0 oz/week     Comment: occasional beer and wine  . Drug Use: No  . Sexual Activity: Not on file   Other Topics Concern  . Not on file   Social History Narrative   Household-- pt and wife    Son lives near by, daughter near [place]           Medication List       This list is accurate as of: 07/03/15  4:15 PM.  Always use your most recent med list.               aspirin 81 MG tablet  Take 81 mg by mouth daily.     atorvastatin 20 MG tablet  Commonly known as:  LIPITOR  Take 1 tablet (20 mg total) by mouth daily.     diltiazem 240 MG 24 hr tablet  Commonly known as:  CARDIZEM LA  Take 1 tablet  (240 mg total) by mouth daily.     Fish Oil 1000 MG Caps  Take 1 capsule by mouth daily.     furosemide 20 MG tablet  Commonly known as:  LASIX  Take 1 tablet (20 mg total) by mouth daily.     glimepiride 2 MG tablet  Commonly known as:  AMARYL  Take 1 tablet (2 mg total) by mouth daily before breakfast.     glucose blood test strip  Commonly known as:  FREESTYLE LITE  Check blood sugar no more than twice daily.     LANCETS ULTRA FINE Misc  Use as directed to check blood sugars 3-4 times daily.     metFORMIN 1000 MG tablet  Commonly known as:  GLUCOPHAGE  Take 1 tablet (1,000 mg total) by mouth 2 (two) times daily with a meal.     metoprolol succinate 100 MG 24 hr tablet  Commonly known as:  TOPROL-XL  Take 1 tablet (100 mg total) by mouth daily. Take with or immediately following a meal.     multivitamin  per tablet  Take 1 tablet by mouth daily.     sitaGLIPtin 100 MG tablet  Commonly known as:  JANUVIA  Take 1 tablet (100 mg total) by mouth daily.     tamsulosin 0.4 MG Caps capsule  Commonly known as:  FLOMAX  Take 1 capsule (0.4 mg total) by mouth daily after supper.           Objective:   Physical Exam BP 122/60 mmHg  Pulse 59  Temp(Src) 98.2 F (36.8 C) (Oral)  Ht 6\' 1"  (1.854 m)  Wt 225 lb (102.059 kg)  BMI 29.69 kg/m2  SpO2 94% General:   Well developed, well nourished . NAD.  HEENT:  Normocephalic . Face symmetric, atraumatic Lungs:  CTA B Normal respiratory effort, no intercostal retractions  Heart: RRR,  no murmur.  No pretibial edema bilaterally  Skin: Not pale. Not jaundice Neurologic:  alert & oriented X3.  Speech normal, gait appropriate for age and unassisted Psych--  Cognition and judgment appear intact.  Cooperative with normal attention span and concentration.  Behavior appropriate. No anxious or depressed appearing.      Assessment & Plan:   Assessment: DM--  + neuropathy HTN Hyperlipidemia PVCs  -- DR Thomasena Edis  03-2015, on CCB,BB BPH BCC Dr. Ronnald Ramp Nail dystrophy   PLAN  07-03-15 DM: Doing well, continue  glimepiride metformin and Januvia. Check labs HTN: Normal BP. Check a BMP Hyperlipidemia: Continue Lipitor, check FLP anemia: Labs Taking a cruise to the [place] in December, will need travel advise , will discuss when he comes back RTC 6 months

## 2015-07-31 ENCOUNTER — Other Ambulatory Visit: Payer: Self-pay | Admitting: Internal Medicine

## 2015-08-05 DIAGNOSIS — E1351 Other specified diabetes mellitus with diabetic peripheral angiopathy without gangrene: Secondary | ICD-10-CM | POA: Diagnosis not present

## 2015-08-05 DIAGNOSIS — I70293 Other atherosclerosis of native arteries of extremities, bilateral legs: Secondary | ICD-10-CM | POA: Diagnosis not present

## 2015-08-05 DIAGNOSIS — L602 Onychogryphosis: Secondary | ICD-10-CM | POA: Diagnosis not present

## 2015-08-05 DIAGNOSIS — L84 Corns and callosities: Secondary | ICD-10-CM | POA: Diagnosis not present

## 2015-08-12 DIAGNOSIS — L821 Other seborrheic keratosis: Secondary | ICD-10-CM | POA: Diagnosis not present

## 2015-08-12 DIAGNOSIS — L720 Epidermal cyst: Secondary | ICD-10-CM | POA: Diagnosis not present

## 2015-08-12 DIAGNOSIS — L570 Actinic keratosis: Secondary | ICD-10-CM | POA: Diagnosis not present

## 2015-08-12 DIAGNOSIS — Z85828 Personal history of other malignant neoplasm of skin: Secondary | ICD-10-CM | POA: Diagnosis not present

## 2015-08-12 DIAGNOSIS — B078 Other viral warts: Secondary | ICD-10-CM | POA: Diagnosis not present

## 2015-09-16 ENCOUNTER — Other Ambulatory Visit: Payer: Self-pay | Admitting: Internal Medicine

## 2015-09-16 NOTE — Telephone Encounter (Signed)
Refill sent per [facility] refill protocol/SLS  

## 2015-10-14 ENCOUNTER — Encounter: Payer: Self-pay | Admitting: Internal Medicine

## 2015-10-14 DIAGNOSIS — L602 Onychogryphosis: Secondary | ICD-10-CM | POA: Diagnosis not present

## 2015-10-14 DIAGNOSIS — L84 Corns and callosities: Secondary | ICD-10-CM | POA: Diagnosis not present

## 2015-10-14 DIAGNOSIS — E119 Type 2 diabetes mellitus without complications: Secondary | ICD-10-CM | POA: Diagnosis not present

## 2015-10-14 DIAGNOSIS — H52203 Unspecified astigmatism, bilateral: Secondary | ICD-10-CM | POA: Diagnosis not present

## 2015-10-14 DIAGNOSIS — H401211 Low-tension glaucoma, right eye, mild stage: Secondary | ICD-10-CM | POA: Diagnosis not present

## 2015-10-14 DIAGNOSIS — E1351 Other specified diabetes mellitus with diabetic peripheral angiopathy without gangrene: Secondary | ICD-10-CM | POA: Diagnosis not present

## 2015-10-14 DIAGNOSIS — H401221 Low-tension glaucoma, left eye, mild stage: Secondary | ICD-10-CM | POA: Diagnosis not present

## 2015-10-14 LAB — HM DIABETES EYE EXAM

## 2015-10-17 ENCOUNTER — Other Ambulatory Visit: Payer: Self-pay | Admitting: Internal Medicine

## 2015-11-05 DIAGNOSIS — H401211 Low-tension glaucoma, right eye, mild stage: Secondary | ICD-10-CM | POA: Diagnosis not present

## 2015-11-18 ENCOUNTER — Other Ambulatory Visit: Payer: Self-pay | Admitting: Internal Medicine

## 2015-11-28 ENCOUNTER — Emergency Department (HOSPITAL_BASED_OUTPATIENT_CLINIC_OR_DEPARTMENT_OTHER)
Admission: EM | Admit: 2015-11-28 | Discharge: 2015-11-28 | Disposition: A | Discharge status: Home / Self Care | Payer: Medicare Other | Attending: Emergency Medicine | Admitting: Emergency Medicine

## 2015-11-28 ENCOUNTER — Encounter (HOSPITAL_BASED_OUTPATIENT_CLINIC_OR_DEPARTMENT_OTHER): Payer: Self-pay

## 2015-11-28 ENCOUNTER — Telehealth: Payer: Self-pay

## 2015-11-28 DIAGNOSIS — Z79899 Other long term (current) drug therapy: Secondary | ICD-10-CM | POA: Insufficient documentation

## 2015-11-28 DIAGNOSIS — Z7982 Long term (current) use of aspirin: Secondary | ICD-10-CM | POA: Diagnosis not present

## 2015-11-28 DIAGNOSIS — Z7984 Long term (current) use of oral hypoglycemic drugs: Secondary | ICD-10-CM | POA: Diagnosis not present

## 2015-11-28 DIAGNOSIS — Z87891 Personal history of nicotine dependence: Secondary | ICD-10-CM | POA: Diagnosis not present

## 2015-11-28 DIAGNOSIS — I10 Essential (primary) hypertension: Secondary | ICD-10-CM | POA: Diagnosis not present

## 2015-11-28 DIAGNOSIS — E119 Type 2 diabetes mellitus without complications: Secondary | ICD-10-CM | POA: Insufficient documentation

## 2015-11-28 DIAGNOSIS — R040 Epistaxis: Secondary | ICD-10-CM | POA: Diagnosis not present

## 2015-11-28 DIAGNOSIS — E785 Hyperlipidemia, unspecified: Secondary | ICD-10-CM | POA: Insufficient documentation

## 2015-11-28 MED ORDER — OXYMETAZOLINE HCL 0.05 % NA SOLN
2.0000 | Freq: Once | NASAL | Status: AC
  Administered 2015-11-28: 2 via NASAL
  Filled 2015-11-28: qty 15

## 2015-11-28 MED ORDER — SILVER NITRATE-POT NITRATE 75-25 % EX MISC
CUTANEOUS | Status: AC
  Filled 2015-11-28: qty 1

## 2015-11-28 MED ORDER — SILVER NITRATE-POT NITRATE 75-25 % EX MISC
1.0000 | Freq: Once | CUTANEOUS | Status: AC
  Administered 2015-11-28: 1 via TOPICAL

## 2015-11-28 NOTE — ED Provider Notes (Addendum)
CSN: [id number]     Arrival date & time 11/28/15  1211 History   First MD Initiated Contact with Patient 11/28/15 1241     Chief Complaint  Patient presents with  . Epistaxis     (Consider location/radiation/quality/duration/timing/severity/associated sxs/prior Treatment) HPI  75 year old male presents with a right nosebleed that started about one hour prior to arrival. He was standing in the park talking when all of a sudden he started to notice the blood was coming out of his nose. Is on a baby aspirin but no other blood thinners. No weakness, dizziness, or chest pain/shortness of breath. No recent injury. No recent URI. Patient states that he was sanding yesterday and had some sawdust before he put on his mask but is not sure if this contributed. Bleeding seems to have stopped after a nasal clamp was put on by nursing staff just prior to me entering the room. Has had to have cauterization and silver nitrate application for nosebleeds, most recently a few years ago. No nose picking  Past Medical History  Diagnosis Date  . Diabetes mellitus   . Basal cell carcinoma     dr Ronnald Ramp  . Premature atrial contractions   . Hypertension   . Hyperlipidemia   . 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 Shoulder surgery      right  . Pilonidal cyst excision    . Tonsillectomy    . Cataract extraction Bilateral 2015  . Refractive surgery     Family History  Problem Relation Age of Onset  . Coronary artery disease Neg Hx   . Diabetes Neg Hx   . Stroke Neg Hx   . Prostate cancer Neg Hx   . Colon cancer Neg Hx   . Esophageal cancer Neg Hx   . Stomach cancer Neg Hx   . Hypertension Mother   . Glaucoma Mother   . Dementia Father   . Thyroid cancer Son   . Parkinsonism Sister    Social History  Substance Use Topics  . Smoking status: Former Smoker    Quit date: 05/24/1966  . Smokeless  tobacco: Former Systems developer    Types: Snuff    Quit date: 05/24/1988  . Alcohol Use: 0.0 oz/week     Comment: occasional beer and wine    Review of Systems  HENT: Positive for nosebleeds.   Respiratory: Negative for shortness of breath.   Cardiovascular: Negative for chest pain.  Neurological: Negative for dizziness, weakness and light-headedness.  All other systems reviewed and are negative.     Allergies  Review of patient's allergies indicates no known allergies.  Home Medications   Prior to Admission medications   Medication Sig Start Date End Date Taking? Authorizing Provider  aspirin 81 MG tablet Take 81 mg by mouth daily.      Historical Provider, MD  atorvastatin (LIPITOR) 20 MG tablet Take 1 tablet (20 mg total) by mouth daily. 11/18/15   Colon Branch, MD  diltiazem (CARDIZEM LA) 240 MG 24 hr tablet Take 1 tablet (240 mg total) by mouth daily. 04/22/15   Sueanne Margarita, MD  furosemide (LASIX) 20 MG tablet Take 1 tablet (20 mg total) by mouth daily. 10/17/15   Colon Branch, MD  glimepiride (AMARYL) 2 MG tablet Take 1 tablet (2 mg total) by mouth daily with breakfast. 07/31/15   Colon Branch, MD  glucose blood (  FREESTYLE LITE) test strip Check blood sugar no more than twice daily. Patient not taking: Reported on 07/03/2015 11/05/14   Colon Branch, MD  LANCETS ULTRA FINE MISC Use as directed to check blood sugars 3-4 times daily. Patient not taking: Reported on 07/03/2015 04/30/15   Colon Branch, MD  metFORMIN (GLUCOPHAGE) 1000 MG tablet Take 1 tablet (1,000 mg total) by mouth 2 (two) times daily with a meal. 10/17/15   Colon Branch, MD  multivitamin [facility]) per tablet Take 1 tablet by mouth daily.      Historical Provider, MD  Omega-3 Fatty Acids (FISH OIL) 1000 MG CAPS Take 1 capsule by mouth daily.     Historical Provider, MD  sitaGLIPtin (JANUVIA) 100 MG tablet Take 1 tablet (100 mg total) by mouth daily. 10/18/14   Colon Branch, MD  tamsulosin (FLOMAX) 0.4 MG CAPS capsule Take 1 capsule (0.4 mg  total) by mouth daily after supper. 03/20/15   Colon Branch, MD  TOPROL XL 100 MG 24 hr tablet TAKE 1 TABLET DAILY WITH OR IMMEDIATELY FOLLOWING A MEAL. 09/16/15   Colon Branch, MD   BP 155/87 mmHg  Pulse 75  Temp(Src) 97.5 F (36.4 C) (Oral)  Resp 18  Ht 6\' 1"  (1.854 m)  Wt 218 lb (98.884 kg)  BMI 28.77 kg/m2  SpO2 96% Physical Exam  Constitutional: He is oriented to person, place, and time. He appears well-developed and well-nourished.  HENT:  Head: Normocephalic and atraumatic.  Right Ear: External ear normal.  Left Ear: External ear normal.  Nose: Epistaxis is observed.  Mouth/Throat: Oropharynx is clear and moist. No oropharyngeal exudate.  Appears to have some bright red blood at Keisselbach's plexus after clot cleared out. Proximal to this there appears to be a cyst/fluid filled structure on septal side, no active bleeding  Eyes: Right eye exhibits no discharge. Left eye exhibits no discharge.  Neck: Neck supple.  Pulmonary/Chest: Effort normal.  Abdominal: He exhibits no distension.  Musculoskeletal: He exhibits no edema.  Neurological: He is alert and oriented to person, place, and time.  Skin: Skin is warm and dry.  Nursing note and vitals reviewed.   ED Course  .Epistaxis Management Date/Time: 11/28/2015 1:19 PM Performed by: Sherwood Gambler Authorized by: Sherwood Gambler Consent: Verbal consent obtained. Risks and benefits: risks, benefits and alternatives were discussed Consent given by: patient Patient sedated: no Treatment site: right anterior Repair method: silver nitrate Post-procedure assessment: bleeding stopped Treatment complexity: simple Patient tolerance: Patient tolerated the procedure well with no immediate complications   (including critical care time) Labs Review Labs Reviewed - No data to display  Imaging Review No results found. I have personally reviewed and evaluated these images and lab results as part of my medical decision-making.   EKG  Interpretation None      MDM   Final diagnoses:  Right-sided epistaxis    Bleeding now controlled. Patient has no symptoms of significant blood loss. There does appear to be some sort of cystic/fluid filled structure proximal to Keisselbach plexus. However this does not appear to look like a septal hematoma. Patient will be instructed to follow-up with ENT. Discussed return precautions.     Sherwood Gambler, MD 11/28/15 1427  Sherwood Gambler, MD 11/28/15 [phone]

## 2015-11-28 NOTE — Telephone Encounter (Signed)
Patient walk in today with nosebleed which started 30 minutes ago. States he was at a park doing nothing but standing talking and his nose started to bleed.  Patient denies headache,dizzziness,nausea. States he has a hx of nosebleeds and 25-30 years ago he had nasal cauterization because of this. Patient states he took all hios medications today except his Baby Aspirin which he takes in the evening. Spoke with Team Lead who advised patient will need to be seen. We currently have no openings with the providers in this office and patient was advised to go directly to ER or Urgent Care. Advised we could try to get him in to another [facility] facility if there were any openings. Patient decided to go downstairs to our ED to be evaluated.

## 2015-11-28 NOTE — ED Notes (Signed)
Nosebleed x 1 hour-states he was standing in the park-no injury-NAD-steady gait-bleeding cont'd

## 2015-11-28 NOTE — ED Notes (Signed)
Pt made aware to return if symptoms worsen or if any life threatening symptoms occur.   

## 2015-11-28 NOTE — ED Notes (Signed)
Pt has bleeding from right nostril, steady trickle at this time, pt has clot forming in the right nostril airway intact.  Pt is not on blood thinners and has no other signs/symptoms.

## 2015-12-01 ENCOUNTER — Other Ambulatory Visit: Payer: Self-pay

## 2015-12-01 MED ORDER — GLUCOSE BLOOD VI STRP: ORAL_STRIP | Status: DC

## 2015-12-04 ENCOUNTER — Telehealth: Payer: Self-pay | Admitting: Internal Medicine

## 2015-12-04 NOTE — Telephone Encounter (Signed)
Left message on VM informing patient that he will need to go to the health department to get the vaccines he need.

## 2015-12-04 NOTE — Telephone Encounter (Signed)
We do not have Typhoid, or Yellow Fever vaccinations here. He will need to go to local health dept.

## 2015-12-04 NOTE — Telephone Encounter (Signed)
°  Relationship to patient: Chase Martinez  Can be [phone]   Reason for call: Patient called to inform provider that he needs Yellow Fever and Typhoid Fever vacs for his trip. States he has discussed with provider and called the CDC to find out what he needed.

## 2015-12-05 ENCOUNTER — Other Ambulatory Visit: Payer: Self-pay

## 2015-12-05 MED ORDER — SITAGLIPTIN PHOSPHATE 100 MG PO TABS: 100.0000 mg | ORAL_TABLET | Freq: Every day | ORAL | Status: DC

## 2015-12-15 ENCOUNTER — Other Ambulatory Visit: Payer: Self-pay

## 2015-12-15 MED ORDER — METOPROLOL SUCCINATE ER 100 MG PO TB24: 100.0000 mg | ORAL_TABLET | Freq: Every day | ORAL | 0 refills | Status: DC

## 2015-12-24 DIAGNOSIS — R040 Epistaxis: Secondary | ICD-10-CM | POA: Diagnosis not present

## 2015-12-24 DIAGNOSIS — H5712 Ocular pain, left eye: Secondary | ICD-10-CM | POA: Diagnosis not present

## 2015-12-30 DIAGNOSIS — L602 Onychogryphosis: Secondary | ICD-10-CM | POA: Diagnosis not present

## 2015-12-30 DIAGNOSIS — I70293 Other atherosclerosis of native arteries of extremities, bilateral legs: Secondary | ICD-10-CM | POA: Diagnosis not present

## 2015-12-30 DIAGNOSIS — E1351 Other specified diabetes mellitus with diabetic peripheral angiopathy without gangrene: Secondary | ICD-10-CM | POA: Diagnosis not present

## 2015-12-30 DIAGNOSIS — L84 Corns and callosities: Secondary | ICD-10-CM | POA: Diagnosis not present

## 2016-01-06 ENCOUNTER — Ambulatory Visit (INDEPENDENT_AMBULATORY_CARE_PROVIDER_SITE_OTHER): Payer: Medicare Other | Admitting: Internal Medicine

## 2016-01-06 ENCOUNTER — Encounter: Payer: Self-pay | Admitting: Internal Medicine

## 2016-01-06 VITALS — BP 118/68 | HR 55 | Temp 97.8°F | Resp 14 | Ht 73.0 in | Wt 216.5 lb

## 2016-01-06 DIAGNOSIS — Z125 Encounter for screening for malignant neoplasm of prostate: Secondary | ICD-10-CM

## 2016-01-06 DIAGNOSIS — Z7189 Other specified counseling: Secondary | ICD-10-CM | POA: Diagnosis not present

## 2016-01-06 DIAGNOSIS — Z09 Encounter for follow-up examination after completed treatment for conditions other than malignant neoplasm: Secondary | ICD-10-CM

## 2016-01-06 DIAGNOSIS — IMO0002 Reserved for concepts with insufficient information to code with codable children: Secondary | ICD-10-CM

## 2016-01-06 DIAGNOSIS — I10 Essential (primary) hypertension: Secondary | ICD-10-CM | POA: Diagnosis not present

## 2016-01-06 DIAGNOSIS — Z0000 Encounter for general adult medical examination without abnormal findings: Secondary | ICD-10-CM

## 2016-01-06 DIAGNOSIS — E119 Type 2 diabetes mellitus without complications: Secondary | ICD-10-CM | POA: Diagnosis not present

## 2016-01-06 DIAGNOSIS — D649 Anemia, unspecified: Secondary | ICD-10-CM | POA: Diagnosis not present

## 2016-01-06 LAB — CBC WITH DIFFERENTIAL/PLATELET
BASOS ABS: 0 10*3/uL (ref 0.0–0.1)
Basophils Relative: 0.3 % (ref 0.0–3.0)
EOS ABS: 0.3 10*3/uL (ref 0.0–0.7)
Eosinophils Relative: 4.6 % (ref 0.0–5.0)
HEMATOCRIT: 37.1 % — AB (ref 39.0–52.0)
HEMOGLOBIN: 12.6 g/dL — AB (ref 13.0–17.0)
LYMPHS PCT: 22.6 % (ref 12.0–46.0)
Lymphs Abs: 1.5 10*3/uL (ref 0.7–4.0)
MCHC: 33.9 g/dL (ref 30.0–36.0)
MCV: 93.2 fl (ref 78.0–100.0)
MONOS PCT: 7.7 % (ref 3.0–12.0)
Monocytes Absolute: 0.5 10*3/uL (ref 0.1–1.0)
NEUTROS ABS: 4.4 10*3/uL (ref 1.4–7.7)
Neutrophils Relative %: 64.8 % (ref 43.0–77.0)
Platelets: 263 10*3/uL (ref 150.0–400.0)
RBC: 3.98 Mil/uL — AB (ref 4.22–5.81)
RDW: 13.4 % (ref 11.5–15.5)
WBC: 6.8 10*3/uL (ref 4.0–10.5)

## 2016-01-06 LAB — VITAMIN B12: VITAMIN B 12: 300 pg/mL (ref 211–911)

## 2016-01-06 LAB — PSA, MEDICARE: PSA: 1.89 ng/mL (ref 0.10–4.00)

## 2016-01-06 LAB — MICROALBUMIN / CREATININE URINE RATIO
CREATININE, U: 32.3 mg/dL
MICROALB/CREAT RATIO: 2.2 mg/g (ref 0.0–30.0)

## 2016-01-06 LAB — BASIC METABOLIC PANEL
BUN: 16 mg/dL (ref 6–23)
CALCIUM: 9.7 mg/dL (ref 8.4–10.5)
CO2: 26 meq/L (ref 19–32)
Chloride: 101 mEq/L (ref 96–112)
Creatinine, Ser: 0.94 mg/dL (ref 0.40–1.50)
GFR: 82.66 mL/min (ref >60.00)
Glucose, Bld: 178 mg/dL — ABNORMAL HIGH (ref 70–99)
Potassium: 4.1 mEq/L (ref 3.5–5.1)
SODIUM: 136 meq/L (ref 135–145)

## 2016-01-06 LAB — FOLATE

## 2016-01-06 LAB — HEMOGLOBIN A1C: Hgb A1c MFr Bld: 6.3 % (ref 4.6–6.5)

## 2016-01-06 NOTE — Assessment & Plan Note (Addendum)
Immunizations: Tdap 2013, Pneumonia shot 5-09; prevnar 2015, had shingles shot 2009 aprox Prostate Ca screening: consitently normal PSAs,pro-cons discussed, elected to screen, DRE today  with slightly increased prostate, check a PSA. CCS: Cscope--  Dr Vladimir Faster in 2002 ,another Cscope 3-13, 1 polyp, next 5 years Diet-exercise discussed

## 2016-01-06 NOTE — Progress Notes (Signed)
Subjective:    Patient ID: Chase Martinez, male    DOB: 01-05-1941, 75 y.o.   MRN: [id number]  DOS:  01/06/2016 Type of visit - description :  Interval history: Here for Medicare AWV:   1. Risk factors based on Past M, S, F history: reviewed 2. Physical Activities:  Golf x 4-5 /week, active, walks  3. Depression/mood: neg screening  4. Hearing:  No problems noted or reported; chronic tinnitus   5. ADL's: independent, drives  6. Fall Risk: no recent falls, prevention discussed , see AVS 7. home Safety: does feel safe at home  8. Height, weight, & visual acuity: see VS, sees eye doctor regularly, s/p cataract surgery, doing well  9. Counseling: provided 10. Labs ordered based on risk factors: if needed  11. Referral Coordination: if needed 12. Care Plan, see assessment and plan , written personalized plan provided , see AVS 13. Cognitive Assessment: motor skills and cognition appropriate for age 69. Care team updated 15. End-of-life care , has a H-POA   In addition, today we discussed the following: DM: Good med compliance, average blood sugar in the morning 116. High cholesterol: On statins, no apparent side effects BPH: Essentially asymptomatic HTN: BP is usually 120/80, BP today is great, he has some degree of bradycardia but denies any symptoms. Taking a trip to [place] in few months, had already some immunizations.  Review of Systems  Constitutional: No fever. No chills. No unexplained wt changes. No unusual sweats  HEENT: No dental problems, no ear discharge, no facial swelling, no voice changes. No eye discharge, no eye  redness , no  intolerance to light   Respiratory: No wheezing , no  difficulty breathing. No cough , no mucus production  Cardiovascular: No CP, no leg swelling , no  Palpitations  GI: no nausea, no vomiting, no diarrhea , no  abdominal pain.  No blood in the stools. No dysphagia, no odynophagia    Endocrine: No polyphagia, no polyuria , no  polydipsia  GU: No dysuria, gross hematuria, difficulty urinating. No urinary urgency, no frequency.  Musculoskeletal: No joint swellings or unusual aches or pains  Skin: No change in the color of the skin, palor , no  Rash  Allergic, immunologic: No environmental allergies , no  food allergies  Neurological: No dizziness no  syncope. No headaches. No diplopia, no slurred, no slurred speech, no motor deficits, no facial  Numbness  Hematological: No enlarged lymph nodes, no easy bruising , no unusual bleedings  Psychiatry: No suicidal ideas, no hallucinations, no beavior problems, no confusion.  No unusual/severe anxiety, no depression  Past Medical History:  Diagnosis Date  . Basal cell carcinoma    dr Ronnald Ramp  . Diabetes mellitus   . Hyperlipidemia   . Hypertension   . 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 Premature atrial contractions     Past Surgical History:  Procedure Laterality Date  . CATARACT EXTRACTION Bilateral 2015  . PILONIDAL CYST EXCISION    . REFRACTIVE SURGERY    . SHOULDER SURGERY     right  . TONSILLECTOMY      Social History   Social History  . Marital status: Married    Spouse name: N/A  . Number of children: 2  . Years of education: N/A   Occupational History  . Retired Therapist, art Engineering geologist)    Social History Main Topics  . Smoking status: Former Audiological scientist  date: 05/24/1966  . Smokeless tobacco: Former Systems developer    Types: Snuff    Quit date: 05/24/1988  . Alcohol use 0.0 oz/week     Comment: occasional beer and wine  . Drug use: No  . Sexual activity: Not on file   Other Topics Concern  . Not on file   Social History Narrative   Household-- pt and wife    Son lives near by, daughter near [place]        Family History  Problem Relation Age of Onset  . Hypertension Mother   . Glaucoma Mother   . Dementia Father   . Parkinsonism Sister   . Thyroid cancer Son   . Coronary  artery disease Neg Hx   . Diabetes Neg Hx   . Stroke Neg Hx   . Prostate cancer Neg Hx   . Colon cancer Neg Hx   . Esophageal cancer Neg Hx   . Stomach cancer Neg Hx       Medication List       Accurate as of 01/06/16 11:59 PM. Always use your most recent med list.          aspirin 81 MG tablet Take 81 mg by mouth daily.   atorvastatin 20 MG tablet Commonly known as:  LIPITOR Take 1 tablet (20 mg total) by mouth daily.   diltiazem 240 MG 24 hr tablet Commonly known as:  CARDIZEM LA Take 1 tablet (240 mg total) by mouth daily.   Fish Oil 1000 MG Caps Take 1 capsule by mouth daily.   furosemide 20 MG tablet Commonly known as:  LASIX Take 1 tablet (20 mg total) by mouth daily.   glimepiride 2 MG tablet Commonly known as:  AMARYL Take 1 tablet (2 mg total) by mouth daily with breakfast.   glucose blood test strip Commonly known as:  FREESTYLE LITE Check blood sugar no more than twice daily.   LANCETS ULTRA FINE Misc Use as directed to check blood sugars 3-4 times daily.   metFORMIN 1000 MG tablet Commonly known as:  GLUCOPHAGE Take 1 tablet (1,000 mg total) by mouth 2 (two) times daily with a meal.   metoprolol succinate 100 MG 24 hr tablet Commonly known as:  TOPROL XL Take 1 tablet (100 mg total) by mouth daily. Take with or immediately following a meal.   multivitamin per tablet Take 1 tablet by mouth daily.   sitaGLIPtin 100 MG tablet Commonly known as:  JANUVIA Take 1 tablet (100 mg total) by mouth daily.   tamsulosin 0.4 MG Caps capsule Commonly known as:  FLOMAX Take 1 capsule (0.4 mg total) by mouth daily after supper.          Objective:   Physical Exam BP 118/68 (BP Location: Left Arm, Patient Position: Sitting, Cuff Size: Normal)   Pulse (!) 55   Temp 97.8 F (36.6 C) (Oral)   Resp 14   Ht 6\' 1"  (1.854 m)   Wt 216 lb 8 oz (98.2 kg)   SpO2 96%   BMI 28.56 kg/m  General:   Well developed, well nourished . NAD.  HEENT:    Normocephalic . Face symmetric, atraumatic Lungs:  CTA B Normal respiratory effort, no intercostal retractions, no accessory muscle use. Heart: RRR,  no murmur.  no pretibial edema bilaterally  Abdomen:  Not distended, soft, non-tender. No rebound or rigidity. No bruit Rectal:  External abnormalities: none. Normal sphincter tone. No rectal masses or tenderness.  Stool brown  Prostate: Prostate gland firm and smooth, mild enlargement, no nodularity, tenderness, mass, asymmetry or induration.  Skin: Not pale. Not jaundice Neurologic:  alert & oriented X3.  Speech normal, gait appropriate for age and unassisted Psych--  Cognition and judgment appear intact.  Cooperative with normal attention span and concentration.  Behavior appropriate. No anxious or depressed appearing.    Assessment & Plan:   Assessment: DM--  + neuropathy HTN Hyperlipidemia PVCs  -- DR Thomasena Edis 03-2015, on CCB,BB BPH BCC Dr. Ronnald Ramp Nail dystrophy   PLAN: DM: Continue glimepiride, metformin, Januvia. Check A1c and micro HTN: Under excellent control, continue Cardizem, Lasix, Toprol. Not on ACE-ARBs. Slightly bradycardic but asx. Check a BMP Hyperlipidemia: Well-controlled per last FLP. Anemia: Recheck a CBC, 123456, folic acid. Iron was normal Nosebleed: S/P ER eval and ENT---> Rx f/u prn Travel advice: going to [place] 04-2016 S/P hep A #1  S/P yellow fever, typhoid immunizations 12/23/2015 CDC web site reviewed, will need malaria prophylaxis. To call me before the trip. RTC 3 months before his trip   In addition to the Medicare exam, spent more than  26 min with the patient: >50% of the time counseling regards travel advice (I had to review the [facility] website while the patient was in the office) and managing his chronic medical problems.

## 2016-01-06 NOTE — Patient Instructions (Signed)
GO TO THE LAB : Get the blood work     GO TO THE FRONT DESK Schedule your next appointment for a  routine checkup in 3 months      Fall Prevention and Home Safety Falls cause injuries and can affect all age groups. It is possible to use preventive measures to significantly decrease the likelihood of falls. There are many simple measures which can make your home safer and prevent falls. OUTDOORS  Repair cracks and edges of walkways and driveways.  Remove high doorway thresholds.  Trim shrubbery on the main path into your home.  Have good outside lighting.  Clear walkways of tools, rocks, debris, and clutter.  Check that handrails are not broken and are securely fastened. Both sides of steps should have handrails.  Have leaves, snow, and ice cleared regularly.  Use sand or salt on walkways during winter months.  In the garage, clean up grease or oil spills. BATHROOM  Install night lights.  Install grab bars by the toilet and in the tub and shower.  Use non-skid mats or decals in the tub or shower.  Place a plastic non-slip stool in the shower to sit on, if needed.  Keep floors dry and clean up all water on the floor immediately.  Remove soap buildup in the tub or shower on a regular basis.  Secure bath mats with non-slip, double-sided rug tape.  Remove throw rugs and tripping hazards from the floors. BEDROOMS  Install night lights.  Make sure a bedside light is easy to reach.  Do not use oversized bedding.  Keep a telephone by your bedside.  Have a firm chair with side arms to use for getting dressed.  Remove throw rugs and tripping hazards from the floor. KITCHEN  Keep handles on pots and pans turned toward the center of the stove. Use back burners when possible.  Clean up spills quickly and allow time for drying.  Avoid walking on wet floors.  Avoid hot utensils and knives.  Position shelves so they are not too high or low.  Place commonly used  objects within easy reach.  If necessary, use a sturdy step stool with a grab bar when reaching.  Keep electrical cables out of the way.  Do not use floor polish or wax that makes floors slippery. If you must use wax, use non-skid floor wax.  Remove throw rugs and tripping hazards from the floor. STAIRWAYS  Never leave objects on stairs.  Place handrails on both sides of stairways and use them. Fix any loose handrails. Make sure handrails on both sides of the stairways are as long as the stairs.  Check carpeting to make sure it is firmly attached along stairs. Make repairs to worn or loose carpet promptly.  Avoid placing throw rugs at the top or bottom of stairways, or properly secure the rug with carpet tape to prevent slippage. Get rid of throw rugs, if possible.  Have an electrician put in a light switch at the top and bottom of the stairs. OTHER FALL PREVENTION TIPS  Wear low-heel or rubber-soled shoes that are supportive and fit well. Wear closed toe shoes.  When using a stepladder, make sure it is fully opened and both spreaders are firmly locked. Do not climb a closed stepladder.  Add color or contrast paint or tape to grab bars and handrails in your home. Place contrasting color strips on first and last steps.  Learn and use mobility aids as needed. Install an Dealer emergency  response system.  Turn on lights to avoid dark areas. Replace light bulbs that burn out immediately. Get light switches that glow.  Arrange furniture to create clear pathways. Keep furniture in the same place.  Firmly attach carpet with non-skid or double-sided tape.  Eliminate uneven floor surfaces.  Select a carpet pattern that does not visually hide the edge of steps.  Be aware of all pets. OTHER HOME SAFETY TIPS  Set the water temperature for 120 F (48.8 C).  Keep emergency numbers on or near the telephone.  Keep smoke detectors on every level of the home and near sleeping  areas. Document Released: 04/30/2002 Document Revised: 11/09/2011 Document Reviewed: 07/30/2011 [facility] Patient Information 2015 [place], [place]. This information is not intended to replace advice given to you by your health care provider. Make sure you discuss any questions you have with your health care provider.   Preventive Care for Adults Ages 47 and over  Blood pressure check.** / Every 1 to 2 years.  Lipid and cholesterol check.**/ Every 5 years beginning at age 88.  Lung cancer screening. / Every year if you are aged 3-80 years and have a 30-pack-year history of smoking and currently smoke or have quit within the past 15 years. Yearly screening is stopped once you have quit smoking for at least 15 years or develop a health problem that would prevent you from having lung cancer treatment.  Fecal occult blood test (FOBT) of stool. / Every year beginning at age 75 and continuing until age 59. You may not have to do this test if you get a colonoscopy every 10 years.  Flexible sigmoidoscopy** or colonoscopy.** / Every 5 years for a flexible sigmoidoscopy or every 10 years for a colonoscopy beginning at age 67 and continuing until age 77.  Hepatitis C blood test.** / For all people born from [birth year] through 1967 and any individual with known risks for hepatitis C.  Abdominal aortic aneurysm (AAA) screening.** / A one-time screening for ages 4 to 3 years who are current or former smokers.  Skin self-exam. / Monthly.  Influenza vaccine. / Every year.  Tetanus, diphtheria, and acellular pertussis (Tdap/Td) vaccine.** / 1 dose of Td every 10 years.  Varicella vaccine.** / Consult your health care provider.  Zoster vaccine.** / 1 dose for adults aged 63 years or older.  Pneumococcal 13-valent conjugate (PCV13) vaccine.** / Consult your health care provider.  Pneumococcal polysaccharide (PPSV23) vaccine.** / 1 dose for all adults aged 74 years and older.  Meningococcal vaccine.** /  Consult your health care provider.  Hepatitis A vaccine.** / Consult your health care provider.  Hepatitis B vaccine.** / Consult your health care provider.  Haemophilus influenzae type b (Hib) vaccine.** / Consult your health care provider. **Family history and personal history of risk and conditions may change your health care provider's recommendations. Document Released: 07/06/2001 Document Revised: 05/15/2013 Document Reviewed: 10/05/2010 [facility] Patient Information 2015 [place], [place]. This information is not intended to replace advice given to you by your health care provider. Make sure you discuss any questions you have with your health care provider.

## 2016-01-06 NOTE — Progress Notes (Signed)
Pre visit review using our clinic review tool, if applicable. No additional management support is needed unless otherwise documented below in the visit note. 

## 2016-01-08 DIAGNOSIS — Z09 Encounter for follow-up examination after completed treatment for conditions other than malignant neoplasm: Secondary | ICD-10-CM | POA: Insufficient documentation

## 2016-01-08 NOTE — Assessment & Plan Note (Addendum)
DM: Continue glimepiride, metformin, Januvia. Check A1c and micro HTN: Under excellent control, continue Cardizem, Lasix, Toprol. Not on ACE-ARBs. Slightly bradycardic but asx. Check a BMP Hyperlipidemia: Well-controlled per last FLP. Anemia: Recheck a CBC, 123456, folic acid. Iron was normal Nosebleed: S/P ER eval and ENT---> Rx f/u prn Travel advice: going to [place] 04-2016 S/P hep A #1  S/P yellow fever, typhoid immunizations 12/23/2015 CDC web site reviewed, will need malaria prophylaxis. To call me before the trip. RTC 3 months before his trip

## 2016-01-27 ENCOUNTER — Other Ambulatory Visit (INDEPENDENT_AMBULATORY_CARE_PROVIDER_SITE_OTHER): Payer: Medicare Other

## 2016-01-27 DIAGNOSIS — Z0000 Encounter for general adult medical examination without abnormal findings: Secondary | ICD-10-CM | POA: Diagnosis not present

## 2016-01-27 LAB — FECAL OCCULT BLOOD, IMMUNOCHEMICAL: FECAL OCCULT BLD: NEGATIVE

## 2016-01-30 ENCOUNTER — Other Ambulatory Visit: Payer: Self-pay | Admitting: Internal Medicine

## 2016-02-04 DIAGNOSIS — H401221 Low-tension glaucoma, left eye, mild stage: Secondary | ICD-10-CM | POA: Diagnosis not present

## 2016-02-10 DIAGNOSIS — L723 Sebaceous cyst: Secondary | ICD-10-CM | POA: Diagnosis not present

## 2016-02-10 DIAGNOSIS — L821 Other seborrheic keratosis: Secondary | ICD-10-CM | POA: Diagnosis not present

## 2016-02-10 DIAGNOSIS — L570 Actinic keratosis: Secondary | ICD-10-CM | POA: Diagnosis not present

## 2016-02-10 DIAGNOSIS — Z85828 Personal history of other malignant neoplasm of skin: Secondary | ICD-10-CM | POA: Diagnosis not present

## 2016-02-13 ENCOUNTER — Telehealth: Payer: Self-pay

## 2016-02-13 NOTE — Telephone Encounter (Signed)
Talked to patient regarding concern about bill for [facility] 01/06/16 for labs. Told patient will send to coders to review and follow up with him.

## 2016-03-02 DIAGNOSIS — L602 Onychogryphosis: Secondary | ICD-10-CM | POA: Diagnosis not present

## 2016-03-02 DIAGNOSIS — E1351 Other specified diabetes mellitus with diabetic peripheral angiopathy without gangrene: Secondary | ICD-10-CM | POA: Diagnosis not present

## 2016-03-02 DIAGNOSIS — L84 Corns and callosities: Secondary | ICD-10-CM | POA: Diagnosis not present

## 2016-03-12 NOTE — Telephone Encounter (Signed)
Pt called in to follow up, he says that he received another bill for labs. Transferred call to supervisor for further assistance

## 2016-03-14 ENCOUNTER — Other Ambulatory Visit: Payer: Self-pay | Admitting: Internal Medicine

## 2016-03-16 ENCOUNTER — Other Ambulatory Visit: Payer: Self-pay | Admitting: Internal Medicine

## 2016-03-19 ENCOUNTER — Encounter: Payer: Self-pay | Admitting: Medical

## 2016-03-19 ENCOUNTER — Ambulatory Visit (INDEPENDENT_AMBULATORY_CARE_PROVIDER_SITE_OTHER): Payer: Medicare Other | Admitting: Medical

## 2016-03-19 VITALS — BP 120/70 | HR 71 | Temp 97.7°F | Ht 73.0 in | Wt 218.6 lb

## 2016-03-19 DIAGNOSIS — R197 Diarrhea, unspecified: Secondary | ICD-10-CM

## 2016-03-19 LAB — COMPLETE METABOLIC PANEL WITH GFR
ALBUMIN: 3.9 g/dL (ref 3.6–5.1)
ALK PHOS: 61 U/L (ref 40–115)
ALT: 24 U/L (ref 9–46)
AST: 23 U/L (ref 10–35)
BILIRUBIN TOTAL: 0.5 mg/dL (ref 0.2–1.2)
BUN: 14 mg/dL (ref 7–25)
CALCIUM: 9.1 mg/dL (ref 8.6–10.3)
CO2: 25 mmol/L (ref 20–31)
CREATININE: 0.96 mg/dL (ref 0.70–1.18)
Chloride: 101 mmol/L (ref 98–110)
GFR, EST AFRICAN AMERICAN: 88 mL/min (ref >=60)
GFR, Est Non African American: 76 mL/min (ref >=60)
Glucose, Bld: 140 mg/dL — ABNORMAL HIGH (ref 65–99)
Potassium: 4.3 mmol/L (ref 3.5–5.3)
Sodium: 137 mmol/L (ref 135–146)
TOTAL PROTEIN: 6.7 g/dL (ref 6.1–8.1)

## 2016-03-19 MED ORDER — DIPHENOXYLATE-ATROPINE 2.5-0.025 MG PO TABS: 1.0000 | ORAL_TABLET | Freq: Four times a day (QID) | ORAL | 0 refills | Status: DC | PRN

## 2016-03-19 NOTE — Progress Notes (Signed)
Pre visit review using our clinic review tool, if applicable. No additional management support is needed unless otherwise documented below in the visit note. 

## 2016-03-19 NOTE — Patient Instructions (Addendum)
For your diarrhea recommend bland diet, hydrate with propel and imodium otc. We discussed use of stronger med. Only use lomotil if you have more severe diarrhea as we discussed.(making lomitil available if needed.)  Please get cbc and cmp today.  Turn in stool panel studies on Monday if your loose stools area persisting.  I think you likely had mild viral infection that will resolve.   If you symptoms linger and studies all negative referral to GI an option.  Follow up in 7 days or as needed

## 2016-03-19 NOTE — Progress Notes (Signed)
Subjective:    Patient ID: Chase Martinez, male    DOB: 08-03-1940, 75 y.o.   MRN: [id number]  HPI   Pt states about one week ago he developed diarrhea. On average it is about 3 loose stools a day. Some loose and some watery. He feels fine otherwise. No fever, no chills or sweats. No family members are sick. Pt states normal bowel pattern of  a couple of times a day but formed stools. No black or bloody stools.  Last colonoscopy 4 years ago. Polyp and mild diverticulosis. No abdomen pain.  Very small loose  1 stool today.   Normal appetite. Pt eating soups last 3 days . Wife was out of town last 3 nights. So some change in his diet.  No new meds.    Review of Systems  Constitutional: Negative for chills, fatigue and fever.  HENT: Negative for congestion and dental problem.   Respiratory: Negative for cough, chest tightness, shortness of breath and wheezing.   Cardiovascular: Negative for chest pain and palpitations.  Gastrointestinal: Positive for diarrhea. Negative for abdominal pain, constipation, nausea, rectal pain and vomiting.  Genitourinary: Negative for dysuria.  Musculoskeletal: Negative for back pain and neck stiffness.  Skin: Negative for rash.  Neurological: Negative for dizziness, seizures and headaches.  Hematological: Negative for adenopathy. Does not bruise/bleed easily.  Psychiatric/Behavioral: Negative for behavioral problems and confusion.    Past Medical History:  Diagnosis Date  . Basal cell carcinoma    dr Ronnald Ramp  . Diabetes mellitus   . Hyperlipidemia   . Hypertension   . 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 Premature atrial contractions      Social History   Social History  . Marital status: Married    Spouse name: N/A  . Number of children: 2  . Years of education: N/A   Occupational History  . Retired Therapist, art Engineering geologist)    Social History Main Topics  . Smoking status: Former  Smoker    Quit date: 05/24/1966  . Smokeless tobacco: Former Systems developer    Types: Snuff    Quit date: 05/24/1988  . Alcohol use 0.0 oz/week     Comment: occasional beer and wine  . Drug use: No  . Sexual activity: Not on file   Other Topics Concern  . Not on file   Social History Narrative   Household-- pt and wife    Son lives near by, daughter near [place]       Past Surgical History:  Procedure Laterality Date  . CATARACT EXTRACTION Bilateral 2015  . PILONIDAL CYST EXCISION    . REFRACTIVE SURGERY    . SHOULDER SURGERY     right  . TONSILLECTOMY      Family History  Problem Relation Age of Onset  . Hypertension Mother   . Glaucoma Mother   . Dementia Father   . Parkinsonism Sister   . Thyroid cancer Son   . Coronary artery disease Neg Hx   . Diabetes Neg Hx   . Stroke Neg Hx   . Prostate cancer Neg Hx   . Colon cancer Neg Hx   . Esophageal cancer Neg Hx   . Stomach cancer Neg Hx     No Known Allergies  Current Outpatient Prescriptions on File Prior to Visit  Medication Sig Dispense Refill  . aspirin 81 MG tablet Take 81 mg by mouth daily.      Marland Kitchen atorvastatin (  LIPITOR) 20 MG tablet Take 1 tablet (20 mg total) by mouth daily. 90 tablet 1  . diltiazem (CARDIZEM LA) 240 MG 24 hr tablet Take 1 tablet (240 mg total) by mouth daily. 90 tablet 3  . furosemide (LASIX) 20 MG tablet Take 1 tablet (20 mg total) by mouth daily. 90 tablet 1  . glimepiride (AMARYL) 2 MG tablet Take 1 tablet (2 mg total) by mouth daily with breakfast. 90 tablet 1  . glucose blood (FREESTYLE LITE) test strip Check blood sugar no more than twice daily. 100 each 12  . LANCETS ULTRA FINE MISC Use as directed to check blood sugars 3-4 times daily. 100 each 12  . metFORMIN (GLUCOPHAGE) 1000 MG tablet Take 1 tablet (1,000 mg total) by mouth 2 (two) times daily with a meal. 180 tablet 1  . metoprolol succinate (TOPROL XL) 100 MG 24 hr tablet Take 1 tablet (100 mg total) by mouth daily. With or  immediately following a meal. 90 tablet 1  . multivitamin (THERAGRAN) per tablet Take 1 tablet by mouth daily.      . Omega-3 Fatty Acids (FISH OIL) 1000 MG CAPS Take 1 capsule by mouth daily.     . sitaGLIPtin (JANUVIA) 100 MG tablet Take 1 tablet (100 mg total) by mouth daily. 90 tablet 1  . tamsulosin (FLOMAX) 0.4 MG CAPS capsule Take 1 capsule (0.4 mg total) by mouth daily after supper. 90 capsule 3   No current facility-administered medications on file prior to visit.     BP 120/70 (BP Location: Left Arm, Patient Position: Sitting)   Pulse 71   Temp 97.7 F (36.5 C) (Oral)   Ht 6\' 1"  (1.854 m)   Wt 218 lb 9.6 oz (99.2 kg)   SpO2 98%   BMI 28.84 kg/m       Objective:   Physical Exam  General Appearance- Not in acute distress.  HEENT Eyes- Scleraeral/Conjuntiva-bilat- Not Yellow. Mouth & Throat- Normal.  Chest and Lung Exam Auscultation: Breath sounds:-Normal. Adventitious sounds:- No Adventitious sounds.  Cardiovascular Auscultation:Rythm - Regular. Heart Sounds -Normal heart sounds.  Abdomen Inspection:-Inspection Normal.  Palpation/Perucssion: Palpation and Percussion of the abdomen reveal- Non Tender, No Rebound tenderness, No rigidity(Guarding) and No Palpable abdominal masses.  Liver:-Normal.  Spleen:- Normal.   Back- no cva tenderness.  Skin- not dry.     Assessment & Plan:  For your diarrhea recommend bland diet, hydrate with propel and imodium otc. We discussed use of stronger med. Only use lomotil if you have more severe diarrhea as we discussed. (making lomitil available if needed.)  Please get cbc and cmp today.  Turn in stool panel studies on Monday if your loose stools area persisting.  I think you likely had mild viral infection that will resolve.   If you symptoms linger and studies all negative referral to GI an  option.  Follow up in 7 days or as needed  Garrette Caine, Percell Miller, [employer]

## 2016-03-24 NOTE — Telephone Encounter (Signed)
Patient called to follow up on this. I sent him to billing and also told him that I would send this message to [facility] to see if she had anything to add regarding this situation.

## 2016-03-25 NOTE — Telephone Encounter (Signed)
Called patient to inform him bill has been submitted to be written off. LVM to call back

## 2016-03-30 DIAGNOSIS — H401211 Low-tension glaucoma, right eye, mild stage: Secondary | ICD-10-CM | POA: Diagnosis not present

## 2016-03-30 DIAGNOSIS — H401221 Low-tension glaucoma, left eye, mild stage: Secondary | ICD-10-CM | POA: Diagnosis not present

## 2016-04-06 ENCOUNTER — Ambulatory Visit (INDEPENDENT_AMBULATORY_CARE_PROVIDER_SITE_OTHER): Payer: Medicare Other | Admitting: Internal Medicine

## 2016-04-06 ENCOUNTER — Encounter: Payer: Self-pay | Admitting: Internal Medicine

## 2016-04-06 ENCOUNTER — Ambulatory Visit (HOSPITAL_BASED_OUTPATIENT_CLINIC_OR_DEPARTMENT_OTHER)
Admission: RE | Admit: 2016-04-06 | Discharge: 2016-04-06 | Disposition: A | Discharge status: Home / Self Care | Payer: Medicare Other | Source: Ambulatory Visit | Attending: Internal Medicine | Admitting: Internal Medicine

## 2016-04-06 VITALS — BP 120/78 | HR 56 | Temp 97.8°F | Resp 12 | Ht 73.0 in | Wt 218.2 lb

## 2016-04-06 DIAGNOSIS — E1140 Type 2 diabetes mellitus with diabetic neuropathy, unspecified: Secondary | ICD-10-CM

## 2016-04-06 DIAGNOSIS — M2578 Osteophyte, vertebrae: Secondary | ICD-10-CM | POA: Diagnosis not present

## 2016-04-06 DIAGNOSIS — Z77090 Contact with and (suspected) exposure to asbestos: Secondary | ICD-10-CM | POA: Insufficient documentation

## 2016-04-06 LAB — HEMOGLOBIN A1C: HEMOGLOBIN A1C: 6.4 % (ref 4.6–6.5)

## 2016-04-06 MED ORDER — ATOVAQUONE-PROGUANIL HCL 250-100 MG PO TABS: 1.0000 | ORAL_TABLET | Freq: Every day | ORAL | 0 refills | Status: DC

## 2016-04-06 NOTE — Progress Notes (Signed)
Pre visit review using our clinic review tool, if applicable. No additional management support is needed unless otherwise documented below in the visit note. 

## 2016-04-06 NOTE — Patient Instructions (Addendum)
GO TO THE LAB : Get the blood work     GO TO THE FRONT DESK Schedule your next appointment for a  routine checkup in 4-5 months   STOP BY THE FIRST FLOOR:  get the XR    Malaria prophylaxis: Start Malarone 1 tablet daily by mouth one or 2 days prior to getting exposed to malaria, continue throughout your trip and continue for additional 7 days after the exposure stops.    Diabetes and Foot Care Diabetes may cause you to have problems because of poor blood supply (circulation) to your feet and legs. This may cause the skin on your feet to become thinner, break easier, and heal more slowly. Your skin may become dry, and the skin may peel and crack. You may also have nerve damage in your legs and feet causing decreased feeling in them. You may not notice minor injuries to your feet that could lead to infections or more serious problems. Taking care of your feet is one of the most important things you can do for yourself. Follow these instructions at home:  Wear shoes at all times, even in the house. Do not go barefoot. Bare feet are easily injured.  Check your feet daily for blisters, cuts, and redness. If you cannot see the bottom of your feet, use a mirror or ask someone for help.  Wash your feet with warm water (do not use hot water) and mild soap. Then pat your feet and the areas between your toes until they are completely dry. Do not soak your feet as this can dry your skin.  Apply a moisturizing lotion or petroleum jelly (that does not contain alcohol and is unscented) to the skin on your feet and to dry, brittle toenails. Do not apply lotion between your toes.  Trim your toenails straight across. Do not dig under them or around the cuticle. File the edges of your nails with an emery board or nail file.  Do not cut corns or calluses or try to remove them with medicine.  Wear clean socks or stockings every day. Make sure they are not too tight. Do not wear knee-high stockings since  they may decrease blood flow to your legs.  Wear shoes that fit properly and have enough cushioning. To break in new shoes, wear them for just a few hours a day. This prevents you from injuring your feet. Always look in your shoes before you put them on to be sure there are no objects inside.  Do not cross your legs. This may decrease the blood flow to your feet.  If you find a minor scrape, cut, or break in the skin on your feet, keep it and the skin around it clean and dry. These areas may be cleansed with mild soap and water. Do not cleanse the area with peroxide, alcohol, or iodine.  When you remove an adhesive bandage, be sure not to damage the skin around it.  If you have a wound, look at it several times a day to make sure it is healing.  Do not use heating pads or hot water bottles. They may burn your skin. If you have lost feeling in your feet or legs, you may not know it is happening until it is too late.  Make sure your health care provider performs a complete foot exam at least annually or more often if you have foot problems. Report any cuts, sores, or bruises to your health care provider immediately. Contact a health  care provider if:  You have an injury that is not healing.  You have cuts or breaks in the skin.  You have an ingrown nail.  You notice redness on your legs or feet.  You feel burning or tingling in your legs or feet.  You have pain or cramps in your legs and feet.  Your legs or feet are numb.  Your feet always feel cold. Get help right away if:  There is increasing redness, swelling, or pain in or around a wound.  There is a red line that goes up your leg.  Pus is coming from a wound.  You develop a fever or as directed by your health care provider.  You notice a bad smell coming from an ulcer or wound. This information is not intended to replace advice given to you by your health care provider. Make sure you discuss any questions you have with  your health care provider. Document Released: 05/07/2000 Document Revised: 10/16/2015 Document Reviewed: 10/17/2012 Elsevier Interactive Patient Education  2017 [employer].

## 2016-04-06 NOTE — Progress Notes (Signed)
Subjective:    Patient ID: Chase Martinez, male    DOB: [birth year], 75 y.o.   MRN: [id number]  DOS:  04/06/2016 Type of visit - description : Routine checkup Interval history: Good medication compliance, no recent ambulatory BPs. Blood sugars fasting are around 100-120. Also requests a chest x-ray due to his history of asbestos exposure. Travel advice,see below.    Review of Systems Denies chest pain, difficulty breathing or cough. Some numbness and the lower extremities, not a new symptom. No visual disturbances.   Past Medical History:  Diagnosis Date  . Basal cell carcinoma    dr Ronnald Ramp  . Diabetes mellitus   . Hyperlipidemia   . Hypertension   . 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 Premature atrial contractions     Past Surgical History:  Procedure Laterality Date  . CATARACT EXTRACTION Bilateral 2015  . PILONIDAL CYST EXCISION    . REFRACTIVE SURGERY    . SHOULDER SURGERY     right  . TONSILLECTOMY      Social History   Social History  . Marital status: Married    Spouse name: N/A  . Number of children: 2  . Years of education: N/A   Occupational History  . Retired Therapist, art Engineering geologist)    Social History Main Topics  . Smoking status: Former Smoker    Quit date: 05/24/1966  . Smokeless tobacco: Former Systems developer    Types: Snuff    Quit date: 05/24/1988  . Alcohol use 0.0 oz/week     Comment: occasional beer and wine  . Drug use: No  . Sexual activity: Not on file   Other Topics Concern  . Not on file   Social History Narrative   Household-- pt and wife    Son lives near by, daughter near [place]           Medication List       Accurate as of 04/06/16 10:45 AM. Always use your most recent med list.          aspirin 81 MG tablet Take 81 mg by mouth daily.   atorvastatin 20 MG tablet Commonly known as:  LIPITOR Take 1 tablet (20 mg total) by mouth daily.   diltiazem 240 MG 24 hr  tablet Commonly known as:  CARDIZEM LA Take 1 tablet (240 mg total) by mouth daily.   diphenoxylate-atropine 2.5-0.025 MG tablet Commonly known as:  LOMOTIL Take 1 tablet by mouth 4 (four) times daily as needed for diarrhea or loose stools.   Fish Oil 1000 MG Caps Take 1 capsule by mouth daily.   furosemide 20 MG tablet Commonly known as:  LASIX Take 1 tablet (20 mg total) by mouth daily.   glimepiride 2 MG tablet Commonly known as:  AMARYL Take 1 tablet (2 mg total) by mouth daily with breakfast.   glucose blood test strip Commonly known as:  FREESTYLE LITE Check blood sugar no more than twice daily.   LANCETS ULTRA FINE Misc Use as directed to check blood sugars 3-4 times daily.   metFORMIN 1000 MG tablet Commonly known as:  GLUCOPHAGE Take 1 tablet (1,000 mg total) by mouth 2 (two) times daily with a meal.   metoprolol succinate 100 MG 24 hr tablet Commonly known as:  TOPROL XL Take 1 tablet (100 mg total) by mouth daily. With or immediately following a meal.   multivitamin per tablet Take 1 tablet by mouth daily.  sitaGLIPtin 100 MG tablet Commonly known as:  JANUVIA Take 1 tablet (100 mg total) by mouth daily.   tamsulosin 0.4 MG Caps capsule Commonly known as:  FLOMAX Take 1 capsule (0.4 mg total) by mouth daily after supper.          Objective:   Physical Exam BP 120/78 (BP Location: Left Arm, Patient Position: Sitting, Cuff Size: Normal)   Pulse (!) 56   Temp 97.8 F (36.6 C) (Oral)   Resp 12   Ht 6\' 1"  (1.854 m)   Wt 218 lb 4 oz (99 kg)   SpO2 97%   BMI 28.79 kg/m  General:   Well developed, well nourished . NAD.  HEENT:  Normocephalic . Face symmetric, atraumatic Lungs:  CTA B Normal respiratory effort, no intercostal retractions, no accessory muscle use. Heart: RRR,  no murmur.  no pretibial edema bilaterally   DIABETIC FEET EXAM: No lower extremity edema Decrease pedal pulses bilaterally Skin normal, nails dystrophic, some toe  deformities Pinprick examination: Patchy decrease sensation distally. Skin: Not pale. Not jaundice Neurologic:  alert & oriented X3.  Speech normal, gait appropriate for age and unassisted Psych--  Cognition and judgment appear intact.  Cooperative with normal attention span and concentration.  Behavior appropriate. No anxious or depressed appearing.       Assessment & Plan:  Assessment: DM--  + neuropathy HTN Hyperlipidemia PVCs  -- DR Thomasena Edis 03-2015, on CCB,BB BPH BCC Dr. Ronnald Ramp Nail dystrophy   PLAN: DM, + neuropathy: Continue Januvia, metformin, glimepiride. Check A1c. Last BMP satisfactory. He has neuropathy, feet care discussed also he sees podiatrist regularly. Has also decrease pedal pulses without claudication. Travel advice: He is up-to-date on vaccinations, does need malaria prophylaxis, CDC web page reviewed, will start Malarone H/o asbestos exposure: Asx, request a chest x-ray. We'll do RTC 4 months

## 2016-04-07 NOTE — Assessment & Plan Note (Signed)
DM, + neuropathy: Continue Januvia, metformin, glimepiride. Check A1c. Last BMP satisfactory. He has neuropathy, feet care discussed also he sees podiatrist regularly. Has also decrease pedal pulses without claudication. Travel advice: He is up-to-date on vaccinations, does need malaria prophylaxis, CDC web page reviewed, will start Malarone H/o asbestos exposure: Asx, request a chest x-ray. We'll do RTC 4 months

## 2016-04-16 ENCOUNTER — Other Ambulatory Visit: Payer: Self-pay | Admitting: Internal Medicine

## 2016-04-20 ENCOUNTER — Encounter: Payer: Self-pay | Admitting: Cardiology

## 2016-04-26 NOTE — Progress Notes (Signed)
Cardiology Office Note    Date:  04/27/2016   ID:  Chase Martinez, DOB Jul 15, 1940, MRN [id number]  PCP:  Kathlene November, MD  Cardiologist:  Fransico Him, MD   Chief Complaint  Patient presents with  . Hypertension    History of Present Illness:  Chase Martinez is a 75 y.o. male with a history of PAC's and HTN who presents today for followup. He is doing well. He denies any chest pain, SOB, DOE, LE edema, dizziness or syncope. Occasionally he will notice a skipped heart beat.   Past Medical History:  Diagnosis Date  . Basal cell carcinoma    dr Ronnald Ramp  . Diabetes mellitus   . Hyperlipidemia   . Hypertension   . 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 Premature atrial contractions     Past Surgical History:  Procedure Laterality Date  . CATARACT EXTRACTION Bilateral 2015  . PILONIDAL CYST EXCISION    . REFRACTIVE SURGERY    . SHOULDER SURGERY     right  . TONSILLECTOMY      Current Medications: Outpatient Medications Prior to Visit  Medication Sig Dispense Refill  . aspirin 81 MG tablet Take 81 mg by mouth daily.      Marland Kitchen atorvastatin (LIPITOR) 20 MG tablet Take 1 tablet (20 mg total) by mouth daily. 90 tablet 1  . diltiazem (CARDIZEM LA) 240 MG 24 hr tablet TAKE 1 TABLET DAILY 90 tablet 3  . furosemide (LASIX) 20 MG tablet Take 1 tablet (20 mg total) by mouth daily. 90 tablet 1  . glimepiride (AMARYL) 2 MG tablet Take 1 tablet (2 mg total) by mouth daily with breakfast. 90 tablet 1  . glucose blood (FREESTYLE LITE) test strip Check blood sugar no more than twice daily. 100 each 12  . LANCETS ULTRA FINE MISC Use as directed to check blood sugars 3-4 times daily. 100 each 12  . metFORMIN (GLUCOPHAGE) 1000 MG tablet Take 1 tablet (1,000 mg total) by mouth 2 (two) times daily with a meal. 180 tablet 1  . metoprolol succinate (TOPROL XL) 100 MG 24 hr tablet Take 1 tablet (100 mg total) by mouth daily. With or immediately  following a meal. 90 tablet 1  . multivitamin (THERAGRAN) per tablet Take 1 tablet by mouth daily.      . Omega-3 Fatty Acids (FISH OIL) 1000 MG CAPS Take 1 capsule by mouth daily.     . sitaGLIPtin (JANUVIA) 100 MG tablet Take 1 tablet (100 mg total) by mouth daily. 90 tablet 1  . tamsulosin (FLOMAX) 0.4 MG CAPS capsule Take 1 capsule (0.4 mg total) by mouth daily after supper. 90 capsule 3  . atovaquone-proguanil (MALARONE) 250-100 MG TABS tablet Take 1 tablet by mouth daily. (Patient not taking: Reported on 04/27/2016) 30 tablet 0   No facility-administered medications prior to visit.      Allergies:   Patient has no known allergies.   Social History   Social History  . Marital status: Married    Spouse name: N/A  . Number of children: 2  . Years of education: N/A   Occupational History  . Retired Therapist, art Engineering geologist)    Social History Main Topics  . Smoking status: Former Smoker    Quit date: 05/24/1966  . Smokeless tobacco: Former Systems developer    Types: Snuff    Quit date: 05/24/1988  . Alcohol use 0.0 oz/week     Comment: occasional beer  and wine  . Drug use: No  . Sexual activity: Not Asked   Other Topics Concern  . None   Social History Narrative   Household-- pt and wife    Son lives near by, daughter near [place]        Family History:  The patient's family history includes Dementia in his father; Glaucoma in his mother; Hypertension in his mother; Parkinsonism in his sister; Thyroid cancer in his son.   ROS:   Please see the history of present illness.    ROS All other systems reviewed and are negative.  No flowsheet data found.     PHYSICAL EXAM:   VS:  BP 114/66   Pulse (!) 55   Ht 6\' 1"  (1.854 m)   Wt 219 lb (99.3 kg)   BMI 28.89 kg/m    GEN: Well nourished, well developed, in no acute distress  HEENT: normal  Neck: no JVD, carotid bruits, or masses Cardiac: RRR; no murmurs, rubs, or gallops,no edema.  Intact distal pulses bilaterally.  Respiratory:   clear to auscultation bilaterally, normal work of breathing GI: soft, nontender, nondistended, + BS MS: no deformity or atrophy  Skin: warm and dry, no rash Neuro:  Alert and Oriented x 3, Strength and sensation are intact Psych: euthymic mood, full affect  Wt Readings from Last 3 Encounters:  04/27/16 219 lb (99.3 kg)  04/06/16 218 lb 4 oz (99 kg)  03/19/16 218 lb 9.6 oz (99.2 kg)      Studies/Labs Reviewed:   EKG:  EKG is ordered today.  The ekg ordered today demonstrates sinus bradycardia at 55bpm with no ST changes  Recent Labs: 01/06/2016: Hemoglobin 12.6; Platelets 263.0 03/19/2016: ALT 24; BUN 14; Creat 0.96; Potassium 4.3; Sodium 137   Lipid Panel    Component Value Date/Time   CHOL 123 07/03/2015 0857   TRIG 99.0 07/03/2015 0857   TRIG 74 05/31/2006 0933   HDL 35.00 (L) 07/03/2015 0857   CHOLHDL 4 07/03/2015 0857   VLDL 19.8 07/03/2015 0857   LDLCALC 68 07/03/2015 0857    Additional studies/ records that were reviewed today include:  none    ASSESSMENT:    1. Premature atrial contractions   2. Essential hypertension      PLAN:  In order of problems listed above:  1. PAC's - well suppressed on BB and CCB.   2. HTN - BP controlled on current meds.  Continue Cardizem and metoprolol.    Medication Adjustments/Labs and Tests Ordered: Current medicines are reviewed at length with the patient today.  Concerns regarding medicines are outlined above.  Medication changes, Labs and Tests ordered today are listed in the Patient Instructions below.  There are no Patient Instructions on file for this visit.   Signed, Fransico Him, MD  04/27/2016 8:25 AM    [facility] [address], [place], [place]  [address] Phone: [phone]; Fax: [phone]

## 2016-04-27 ENCOUNTER — Ambulatory Visit (INDEPENDENT_AMBULATORY_CARE_PROVIDER_SITE_OTHER): Payer: Medicare Other | Admitting: Cardiology

## 2016-04-27 ENCOUNTER — Encounter: Payer: Self-pay | Admitting: Cardiology

## 2016-04-27 VITALS — BP 114/66 | HR 55 | Ht 73.0 in | Wt 219.0 lb

## 2016-04-27 DIAGNOSIS — I491 Atrial premature depolarization: Secondary | ICD-10-CM | POA: Diagnosis not present

## 2016-04-27 DIAGNOSIS — I10 Essential (primary) hypertension: Secondary | ICD-10-CM

## 2016-04-27 NOTE — Patient Instructions (Signed)

## 2016-05-18 ENCOUNTER — Other Ambulatory Visit: Payer: Self-pay | Admitting: Internal Medicine

## 2016-05-22 ENCOUNTER — Other Ambulatory Visit: Payer: Self-pay | Admitting: Internal Medicine

## 2016-06-01 DIAGNOSIS — E1351 Other specified diabetes mellitus with diabetic peripheral angiopathy without gangrene: Secondary | ICD-10-CM | POA: Diagnosis not present

## 2016-06-01 DIAGNOSIS — L602 Onychogryphosis: Secondary | ICD-10-CM | POA: Diagnosis not present

## 2016-06-06 ENCOUNTER — Other Ambulatory Visit: Payer: Self-pay | Admitting: Internal Medicine

## 2016-06-09 ENCOUNTER — Other Ambulatory Visit: Payer: Self-pay | Admitting: Internal Medicine

## 2016-06-22 ENCOUNTER — Encounter: Payer: Self-pay | Admitting: Internal Medicine

## 2016-06-23 ENCOUNTER — Encounter: Payer: Self-pay | Admitting: Internal Medicine

## 2016-07-28 ENCOUNTER — Other Ambulatory Visit: Payer: Self-pay | Admitting: Internal Medicine

## 2016-08-03 ENCOUNTER — Ambulatory Visit (AMBULATORY_SURGERY_CENTER): Payer: Self-pay

## 2016-08-03 VITALS — Ht 71.5 in | Wt 221.2 lb

## 2016-08-03 DIAGNOSIS — H401231 Low-tension glaucoma, bilateral, mild stage: Secondary | ICD-10-CM | POA: Diagnosis not present

## 2016-08-03 DIAGNOSIS — Z860100 Personal history of colon polyps, unspecified: Secondary | ICD-10-CM

## 2016-08-03 MED ORDER — SUPREP BOWEL PREP KIT 17.5-3.13-1.6 GM/177ML PO SOLN: 1.0000 | Freq: Once | ORAL | 0 refills | Status: AC

## 2016-08-03 NOTE — Progress Notes (Signed)
No home oxygen No diet meds No allergies to eggs or soy No past problems with anesthesia  Declined emmi

## 2016-08-05 ENCOUNTER — Encounter: Payer: Self-pay | Admitting: Internal Medicine

## 2016-08-05 ENCOUNTER — Ambulatory Visit (INDEPENDENT_AMBULATORY_CARE_PROVIDER_SITE_OTHER): Payer: Medicare Other | Admitting: Internal Medicine

## 2016-08-05 VITALS — BP 122/68 | HR 61 | Temp 97.8°F | Resp 12 | Ht 73.0 in | Wt 220.5 lb

## 2016-08-05 DIAGNOSIS — Z23 Encounter for immunization: Secondary | ICD-10-CM | POA: Diagnosis not present

## 2016-08-05 DIAGNOSIS — E1140 Type 2 diabetes mellitus with diabetic neuropathy, unspecified: Secondary | ICD-10-CM | POA: Diagnosis not present

## 2016-08-05 DIAGNOSIS — E785 Hyperlipidemia, unspecified: Secondary | ICD-10-CM | POA: Diagnosis not present

## 2016-08-05 NOTE — Progress Notes (Signed)
Pre visit review using our clinic review tool, if applicable. No additional management support is needed unless otherwise documented below in the visit note. 

## 2016-08-05 NOTE — Patient Instructions (Addendum)
  GO TO THE FRONT DESK Schedule your next appointment for a  physical exam by 12-2016  Schedule labs to be done within the next few days, fasting: BMP, FLP, A1c  Please talk to [facility] regards your billing  and coding

## 2016-08-05 NOTE — Assessment & Plan Note (Signed)
DM, neuropathy: Continue glimepiride, metformin, Januvia. Check a BMP and A1c. Neuropathy symptoms at baseline, so far not affecting his lifestyle. Hyperlipidemia: On Lipitor, check a FLP, will come back fasting. PACs: Stable per cardiology, no change recommended at the visit on 04/27/2016 Pneumonia 23 booster today. RTC 12-2016, CPX.

## 2016-08-05 NOTE — Progress Notes (Signed)
Subjective:    Patient ID: Chase Martinez, male    DOB: [birth year], 76 y.o.   MRN: [id number]  DOS:  08/05/2016 Type of visit - description :  Routine checkup Interval history: PACs: Cardiology note reviewed DM, hypertension: Good med compliance, no apparent side effects, ambulatory BPs checked from time to time and normal. Blood sugars in the morning fasting on average 119.   Review of Systems Neuropathy symptoms at baseline.  Past Medical History:  Diagnosis Date  . Basal cell carcinoma    dr Ronnald Ramp  . Diabetes mellitus   . Hyperlipidemia   . Hypertension   . 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 Premature atrial contractions     Past Surgical History:  Procedure Laterality Date  . CATARACT EXTRACTION Bilateral 2015  . PILONIDAL CYST EXCISION    . REFRACTIVE SURGERY    . SHOULDER SURGERY     right  . TONSILLECTOMY      Social History   Social History  . Marital status: Married    Spouse name: N/A  . Number of children: 2  . Years of education: N/A   Occupational History  . Retired Therapist, art Engineering geologist)    Social History Main Topics  . Smoking status: Former Smoker    Quit date: 05/24/1966  . Smokeless tobacco: Former Systems developer    Types: Snuff    Quit date: 05/24/1988  . Alcohol use 4.2 - 8.4 oz/week    7 - 14 Glasses of wine per week     Comment: occasional beer and wine  . Drug use: No  . Sexual activity: Not on file   Other Topics Concern  . Not on file   Social History Narrative   Household-- pt and wife    Son lives near by, daughter near [place] as of 08/05/2016   No Known Allergies     Medication List       Accurate as of 08/05/16  2:08 PM. Always use your most recent med list.          aspirin 81 MG tablet Take 81 mg by mouth daily.   atorvastatin 20 MG tablet Commonly known as:  LIPITOR Take 1 tablet (20 mg total) by mouth daily.   diltiazem 240 MG 24 hr  tablet Commonly known as:  CARDIZEM LA TAKE 1 TABLET DAILY   Fish Oil 1000 MG Caps Take 1 capsule by mouth daily.   freestyle lancets USE AS DIRECTED TO CHECK BLOOD SUGARS 3 TO 4 TIMES DAILY   furosemide 20 MG tablet Commonly known as:  LASIX Take 1 tablet (20 mg total) by mouth daily.   glimepiride 2 MG tablet Commonly known as:  AMARYL Take 1 tablet (2 mg total) by mouth daily with breakfast.   glucose blood test strip Commonly known as:  FREESTYLE LITE Check blood sugar no more than twice daily.   metFORMIN 1000 MG tablet Commonly known as:  GLUCOPHAGE Take 1 tablet (1,000 mg total) by mouth 2 (two) times daily with a meal.   metoprolol succinate 100 MG 24 hr tablet Commonly known as:  TOPROL XL Take 1 tablet (100 mg total) by mouth daily. With or immediately following a meal.   multivitamin per tablet Take 1 tablet by mouth daily.   sitaGLIPtin 100 MG tablet Commonly known as:  JANUVIA Take 1 tablet (100 mg total) by mouth daily.  tamsulosin 0.4 MG Caps capsule Commonly known as:  FLOMAX Take 1 capsule (0.4 mg total) by mouth daily after supper.          Objective:   Physical Exam BP 122/68 (BP Location: Left Arm, Patient Position: Sitting, Cuff Size: Normal)   Pulse 61   Temp 97.8 F (36.6 C) (Oral)   Resp 12   Ht 6\' 1"  (1.854 m)   Wt 220 lb 8 oz (100 kg)   SpO2 97%   BMI 29.09 kg/m  General:   Well developed, well nourished . NAD.  HEENT:  Normocephalic . Face symmetric, atraumatic Lungs:  CTA B Normal respiratory effort, no intercostal retractions, no accessory muscle use. Heart: RRR,  no murmur.  No pretibial edema bilaterally  Skin: Not pale. Not jaundice Neurologic:  alert & oriented X3.  Speech normal, gait appropriate for age and unassisted Psych--  Cognition and judgment appear intact.  Cooperative with normal attention span and concentration.  Behavior appropriate. No anxious or depressed appearing.      Assessment & Plan:    assessment: DM--  + neuropathy HTN Hyperlipidemia PVCs  -- DR Thomasena Edis 03-2015, on CCB,BB BPH BCC Dr. Ronnald Ramp Nail dystrophy   PLAN: DM, neuropathy: Continue glimepiride, metformin, Januvia. Check a BMP and A1c. Neuropathy symptoms at baseline, so far not affecting his lifestyle. Hyperlipidemia: On Lipitor, check a FLP, will come back fasting. PACs: Stable per cardiology, no change recommended at the visit on 04/27/2016 Pneumonia 23 booster today. RTC 12-2016, CPX.

## 2016-08-10 DIAGNOSIS — D225 Melanocytic nevi of trunk: Secondary | ICD-10-CM | POA: Diagnosis not present

## 2016-08-10 DIAGNOSIS — L570 Actinic keratosis: Secondary | ICD-10-CM | POA: Diagnosis not present

## 2016-08-10 DIAGNOSIS — L723 Sebaceous cyst: Secondary | ICD-10-CM | POA: Diagnosis not present

## 2016-08-10 DIAGNOSIS — Z85828 Personal history of other malignant neoplasm of skin: Secondary | ICD-10-CM | POA: Diagnosis not present

## 2016-08-10 DIAGNOSIS — D485 Neoplasm of uncertain behavior of skin: Secondary | ICD-10-CM | POA: Diagnosis not present

## 2016-08-12 ENCOUNTER — Telehealth: Payer: Self-pay | Admitting: Internal Medicine

## 2016-08-12 DIAGNOSIS — E1351 Other specified diabetes mellitus with diabetic peripheral angiopathy without gangrene: Secondary | ICD-10-CM | POA: Diagnosis not present

## 2016-08-12 DIAGNOSIS — L84 Corns and callosities: Secondary | ICD-10-CM | POA: Diagnosis not present

## 2016-08-12 DIAGNOSIS — B351 Tinea unguium: Secondary | ICD-10-CM | POA: Diagnosis not present

## 2016-08-12 NOTE — Telephone Encounter (Signed)
Reviewed procedure instructions with pt over the phone and his questions were answered.

## 2016-08-13 ENCOUNTER — Telehealth: Payer: Self-pay | Admitting: Internal Medicine

## 2016-08-13 NOTE — Telephone Encounter (Signed)
Ebony Devol        It does not look like we sent records. There in no request in the chart. I will e-mail Charge correct for an adjustment then.   Thanks  Ebony   Previous Messages    ----- Message -----  From: Philbert Riser  Sent: 08/05/2016  3:19 PM  To: Oneida Alar   I can not locate ABN in epic. I think these need to be adjusted off. Do we know for sure the medical records were sent to his insurance?   Thanks,  Dawn  ----- Message -----  From: Oneida Alar  Sent: 08/05/2016  1:57 PM  To: Philbert Riser   Hello,   This patient is saying his insurance denied payment because it was not submitted documentation to support the need for the testing for his X18 and folic acid test done on DOS: 01/06/16 He is also saying he did not sign an ABN.   Can you see if we coded it correctly.   Thanks  Ebony      Patient has been notified

## 2016-08-16 ENCOUNTER — Other Ambulatory Visit (INDEPENDENT_AMBULATORY_CARE_PROVIDER_SITE_OTHER): Payer: Medicare Other

## 2016-08-16 DIAGNOSIS — E1140 Type 2 diabetes mellitus with diabetic neuropathy, unspecified: Secondary | ICD-10-CM

## 2016-08-16 DIAGNOSIS — E785 Hyperlipidemia, unspecified: Secondary | ICD-10-CM

## 2016-08-16 LAB — LIPID PANEL
CHOL/HDL RATIO: 4
Cholesterol: 118 mg/dL (ref 0–200)
HDL: 33.1 mg/dL — AB (ref >39.00)
LDL Cholesterol: 60 mg/dL (ref 0–99)
NONHDL: 84.82
Triglycerides: 122 mg/dL (ref 0.0–149.0)
VLDL: 24.4 mg/dL (ref 0.0–40.0)

## 2016-08-16 LAB — BASIC METABOLIC PANEL
BUN: 15 mg/dL (ref 6–23)
CO2: 27 meq/L (ref 19–32)
Calcium: 9.6 mg/dL (ref 8.4–10.5)
Chloride: 101 mEq/L (ref 96–112)
Creatinine, Ser: 0.92 mg/dL (ref 0.40–1.50)
GFR: 84.6 mL/min (ref >60.00)
GLUCOSE: 136 mg/dL — AB (ref 70–99)
POTASSIUM: 4.5 meq/L (ref 3.5–5.1)
SODIUM: 133 meq/L — AB (ref 135–145)

## 2016-08-16 LAB — HEMOGLOBIN A1C: Hgb A1c MFr Bld: 6.7 % — ABNORMAL HIGH (ref 4.6–6.5)

## 2016-08-17 ENCOUNTER — Encounter: Payer: Self-pay | Admitting: Internal Medicine

## 2016-08-17 ENCOUNTER — Ambulatory Visit (AMBULATORY_SURGERY_CENTER): Payer: Medicare Other | Admitting: Internal Medicine

## 2016-08-17 VITALS — BP 117/68 | HR 59 | Temp 95.7°F | Resp 24 | Ht 71.0 in | Wt 221.0 lb

## 2016-08-17 DIAGNOSIS — Z86010 Personal history of colonic polyps: Secondary | ICD-10-CM

## 2016-08-17 DIAGNOSIS — D124 Benign neoplasm of descending colon: Secondary | ICD-10-CM | POA: Diagnosis not present

## 2016-08-17 DIAGNOSIS — E119 Type 2 diabetes mellitus without complications: Secondary | ICD-10-CM | POA: Diagnosis not present

## 2016-08-17 DIAGNOSIS — Z1211 Encounter for screening for malignant neoplasm of colon: Secondary | ICD-10-CM | POA: Diagnosis not present

## 2016-08-17 DIAGNOSIS — I10 Essential (primary) hypertension: Secondary | ICD-10-CM | POA: Diagnosis not present

## 2016-08-17 MED ORDER — SODIUM CHLORIDE 0.9 % IV SOLN: 500.0000 mL | INTRAVENOUS | Status: DC

## 2016-08-17 NOTE — Progress Notes (Signed)
Patient awakening,vss,report to rn 

## 2016-08-17 NOTE — Op Note (Signed)
[place] Patient Name: Chase Martinez Procedure Date: 08/17/2016 8:34 AM MRN: [id number] Endoscopist: Jerene Bears , MD Age: 76 Referring MD:  Date of Birth: 02-01-1941 Gender: Male Account #: [id number] Procedure:                Colonoscopy Indications:              Surveillance: Personal history of adenomatous                            polyps on last colonoscopy 5 years ago Medicines:                Monitored Anesthesia Care Procedure:                Pre-Anesthesia Assessment:                           - Prior to the procedure, a History and Physical                            was performed, and patient medications and                            allergies were reviewed. The patient's tolerance of                            previous anesthesia was also reviewed. The risks                            and benefits of the procedure and the sedation                            options and risks were discussed with the patient.                            All questions were answered, and informed consent                            was obtained. Prior Anticoagulants: The patient has                            taken no previous anticoagulant or antiplatelet                            agents. ASA Grade Assessment: II - A patient with                            mild systemic disease. After reviewing the risks                            and benefits, the patient was deemed in                            satisfactory condition to undergo the procedure.  After obtaining informed consent, the colonoscope                            was passed under direct vision. Throughout the                            procedure, the patient's blood pressure, pulse, and                            oxygen saturations were monitored continuously. The                            Colonoscope was introduced through the anus and                            advanced to the the cecum,  identified by                            appendiceal orifice and ileocecal valve. The                            colonoscopy was performed without difficulty. The                            patient tolerated the procedure well. The quality                            of the bowel preparation was good. The ileocecal                            valve, appendiceal orifice, and rectum were                            photographed. Scope In: 8:41:34 AM Scope Out: 8:59:42 AM Scope Withdrawal Time: 0 hours 13 minutes 50 seconds  Total Procedure Duration: 0 hours 18 minutes 8 seconds  Findings:                 The digital rectal exam was normal.                           A 5 mm polyp was found in the descending colon. The                            polyp was sessile. The polyp was removed with a                            cold snare. Resection and retrieval were complete.                           Multiple small and large-mouthed diverticula were                            found from ascending colon to sigmoid colon.  Internal hemorrhoids were found during                            retroflexion. The hemorrhoids were small. Complications:            No immediate complications. Estimated Blood Loss:     Estimated blood loss was minimal. Impression:               - One 5 mm polyp in the descending colon, removed                            with a cold snare. Resected and retrieved.                           - Moderate diverticulosis from ascending colon to                            sigmoid colon.                           - Internal hemorrhoids. Recommendation:           - Patient has a contact number available for                            emergencies. The signs and symptoms of potential                            delayed complications were discussed with the                            patient. Return to normal activities tomorrow.                            Written  discharge instructions were provided to the                            patient.                           - Resume previous diet.                           - Continue present medications.                           - Await pathology results.                           - No recommendation at this time regarding repeat                            colonoscopy due to age. Jerene Bears, MD 08/17/2016 9:03:43 AM This report has been signed electronically.

## 2016-08-17 NOTE — Progress Notes (Signed)
Called to room to assist during endoscopic procedure.  Patient ID and intended procedure confirmed with present staff. Received instructions for my participation in the procedure from the performing physician.  

## 2016-08-17 NOTE — Patient Instructions (Signed)
Handouts given on polyps, diverticulosis and high fiber diet  YOU HAD AN ENDOSCOPIC PROCEDURE TODAY: Refer to the procedure report and other information in the discharge instructions given to you for any specific questions about what was found during the examination. If this information does not answer your questions, please call [facility] office at [phone] to clarify.   YOU SHOULD EXPECT: Some feelings of bloating in the abdomen. Passage of more gas than usual. Walking can help get rid of the air that was put into your GI tract during the procedure and reduce the bloating. If you had a lower endoscopy (such as a colonoscopy or flexible sigmoidoscopy) you may notice spotting of blood in your stool or on the toilet paper. Some abdominal soreness may be present for a day or two, also.  DIET: Your first meal following the procedure should be a light meal and then it is ok to progress to your normal diet. A half-sandwich or bowl of soup is an example of a good first meal. Heavy or fried foods are harder to digest and may make you feel nauseous or bloated. Drink plenty of fluids but you should avoid alcoholic beverages for 24 hours. If you had a esophageal dilation, please see attached instructions for diet.    ACTIVITY: Your care partner should take you home directly after the procedure. You should plan to take it easy, moving slowly for the rest of the day. You can resume normal activity the day after the procedure however YOU SHOULD NOT DRIVE, use power tools, machinery or perform tasks that involve climbing or major physical exertion for 24 hours (because of the sedation medicines used during the test).   SYMPTOMS TO REPORT IMMEDIATELY: A gastroenterologist can be reached at any hour. Please call [phone]  for any of the following symptoms:  Following lower endoscopy (colonoscopy, flexible sigmoidoscopy) Excessive amounts of blood in the stool  Significant tenderness, worsening of abdominal pains   Swelling of the abdomen that is new, acute  Fever of 100 or higher    FOLLOW UP:  If any biopsies were taken you will be contacted by phone or by letter within the next 1-3 weeks. Call [phone]  if you have not heard about the biopsies in 3 weeks.  Please also call with any specific questions about appointments or follow up tests.

## 2016-08-17 NOTE — Progress Notes (Signed)
Pt's states no medical or surgical changes since previsit or office visit. 

## 2016-08-18 ENCOUNTER — Telehealth: Payer: Self-pay

## 2016-08-18 NOTE — Telephone Encounter (Signed)
  Follow up Call-  Call back number 08/17/2016  Post procedure Call Back phone  # [phone]  Permission to leave phone message Yes  Some recent data might be hidden     Patient questions:  Do you have a fever, pain , or abdominal swelling? No. Pain Score  0 *  Have you tolerated food without any problems? Yes.    Have you been able to return to your normal activities? Yes.    Do you have any questions about your discharge instructions: Diet   No. Medications  No. Follow up visit  No.  Do you have questions or concerns about your Care? No.  Actions: * If pain score is 4 or above: No action needed, pain <4.

## 2016-08-23 ENCOUNTER — Encounter: Payer: Self-pay | Admitting: Internal Medicine

## 2016-09-11 ENCOUNTER — Other Ambulatory Visit: Payer: Self-pay | Admitting: Internal Medicine

## 2016-10-19 ENCOUNTER — Other Ambulatory Visit: Payer: Self-pay | Admitting: Internal Medicine

## 2016-10-27 NOTE — Telephone Encounter (Signed)
Billing has made the adjustment and the patient has been notified

## 2016-10-27 NOTE — Telephone Encounter (Signed)
Patient called to get update on billing adjustment. Tried to call patient back with update but call would not go through. I have emailed the coding billing manager to get this corrected. She will be out of the office until 6/13 I will call patient again to inform him of this information

## 2016-10-28 DIAGNOSIS — L84 Corns and callosities: Secondary | ICD-10-CM | POA: Diagnosis not present

## 2016-10-28 DIAGNOSIS — L602 Onychogryphosis: Secondary | ICD-10-CM | POA: Diagnosis not present

## 2016-10-28 DIAGNOSIS — E1351 Other specified diabetes mellitus with diabetic peripheral angiopathy without gangrene: Secondary | ICD-10-CM | POA: Diagnosis not present

## 2016-11-15 ENCOUNTER — Other Ambulatory Visit: Payer: Self-pay | Admitting: Internal Medicine

## 2016-11-21 ENCOUNTER — Other Ambulatory Visit: Payer: Self-pay | Admitting: Internal Medicine

## 2016-12-04 ENCOUNTER — Other Ambulatory Visit: Payer: Self-pay | Admitting: Internal Medicine

## 2016-12-07 DIAGNOSIS — H43813 Vitreous degeneration, bilateral: Secondary | ICD-10-CM | POA: Diagnosis not present

## 2016-12-07 DIAGNOSIS — H401231 Low-tension glaucoma, bilateral, mild stage: Secondary | ICD-10-CM | POA: Diagnosis not present

## 2016-12-07 DIAGNOSIS — Z961 Presence of intraocular lens: Secondary | ICD-10-CM | POA: Diagnosis not present

## 2017-01-06 ENCOUNTER — Other Ambulatory Visit: Payer: Self-pay | Admitting: Internal Medicine

## 2017-01-11 DIAGNOSIS — M79671 Pain in right foot: Secondary | ICD-10-CM | POA: Diagnosis not present

## 2017-01-11 DIAGNOSIS — M79672 Pain in left foot: Secondary | ICD-10-CM | POA: Diagnosis not present

## 2017-01-11 DIAGNOSIS — B351 Tinea unguium: Secondary | ICD-10-CM | POA: Diagnosis not present

## 2017-01-11 DIAGNOSIS — L84 Corns and callosities: Secondary | ICD-10-CM | POA: Diagnosis not present

## 2017-01-11 DIAGNOSIS — E1351 Other specified diabetes mellitus with diabetic peripheral angiopathy without gangrene: Secondary | ICD-10-CM | POA: Diagnosis not present

## 2017-01-18 ENCOUNTER — Encounter: Payer: Medicare Other | Admitting: Internal Medicine

## 2017-01-25 DIAGNOSIS — M545 Low back pain: Secondary | ICD-10-CM | POA: Diagnosis not present

## 2017-01-26 ENCOUNTER — Other Ambulatory Visit: Payer: Self-pay | Admitting: Internal Medicine

## 2017-02-04 ENCOUNTER — Encounter: Payer: Medicare Other | Admitting: Internal Medicine

## 2017-02-08 DIAGNOSIS — L720 Epidermal cyst: Secondary | ICD-10-CM | POA: Diagnosis not present

## 2017-02-08 DIAGNOSIS — D225 Melanocytic nevi of trunk: Secondary | ICD-10-CM | POA: Diagnosis not present

## 2017-02-08 DIAGNOSIS — L570 Actinic keratosis: Secondary | ICD-10-CM | POA: Diagnosis not present

## 2017-02-08 DIAGNOSIS — Z85828 Personal history of other malignant neoplasm of skin: Secondary | ICD-10-CM | POA: Diagnosis not present

## 2017-02-20 ENCOUNTER — Other Ambulatory Visit: Payer: Self-pay | Admitting: Internal Medicine

## 2017-03-02 ENCOUNTER — Encounter: Payer: Self-pay | Admitting: Internal Medicine

## 2017-03-02 ENCOUNTER — Ambulatory Visit (INDEPENDENT_AMBULATORY_CARE_PROVIDER_SITE_OTHER): Payer: Medicare Other | Admitting: Internal Medicine

## 2017-03-02 VITALS — BP 122/58 | HR 58 | Temp 97.8°F | Resp 14 | Ht 71.0 in | Wt 215.4 lb

## 2017-03-02 DIAGNOSIS — E785 Hyperlipidemia, unspecified: Secondary | ICD-10-CM | POA: Diagnosis not present

## 2017-03-02 DIAGNOSIS — Z23 Encounter for immunization: Secondary | ICD-10-CM | POA: Diagnosis not present

## 2017-03-02 DIAGNOSIS — N400 Enlarged prostate without lower urinary tract symptoms: Secondary | ICD-10-CM | POA: Diagnosis not present

## 2017-03-02 DIAGNOSIS — E1140 Type 2 diabetes mellitus with diabetic neuropathy, unspecified: Secondary | ICD-10-CM

## 2017-03-02 DIAGNOSIS — Z0000 Encounter for general adult medical examination without abnormal findings: Secondary | ICD-10-CM

## 2017-03-02 DIAGNOSIS — I10 Essential (primary) hypertension: Secondary | ICD-10-CM

## 2017-03-02 NOTE — Progress Notes (Signed)
Pre visit review using our clinic review tool, if applicable. No additional management support is needed unless otherwise documented below in the visit note. 

## 2017-03-02 NOTE — Assessment & Plan Note (Addendum)
Immunizations: Tdap 2013, Pneumonia shot 5-09; prevnar 2015, had shingles shot 2009 aprox; shingrix discussed today Flu shot today  Prostate Ca screening: consitently normal PSAs , last PSA 2017  CCS: Cscope--  Dr Vladimir Faster in 2002 ,another Cscope 3-13, 1 polyp, colonoscopy 07-2016, next in 5 years or at the discretion of pt and PCP Diet-exercise discussed

## 2017-03-02 NOTE — Patient Instructions (Addendum)
Mr. Loewen , Thank you for taking time to come for your Medicare Wellness Visit. I appreciate your ongoing commitment to your health goals. Please review the following plan we discussed and let me know if I can assist you in the future.   These are the goals we discussed: Goals      Patient Stated   . Maintain current health. (pt-stated)       This is a list of the screening recommended for you and due dates:  Health Maintenance  Topic Date Due  . Eye exam for diabetics  10/13/2016  . Urine Protein Check  01/05/2017  . Hemoglobin A1C  02/16/2017  . Complete foot exam   03/02/2018  . Tetanus Vaccine  05/22/2023  . Flu Shot  Completed  . Pneumonia vaccines  Completed   Continue to eat heart healthy diet (full of fruits, vegetables, whole grains, lean protein, water--limit salt, fat, and sugar intake) and increase physical activity as tolerated. Continue doing brain stimulating activities (puzzles, reading, adult coloring books, staying active) to keep memory sharp.  Bring a copy of your living will and/or healthcare power of attorney to your next office visit.   GO TO THE LAB : Get the blood work     GO TO THE FRONT DESK Schedule your next appointment for a  Check up in 6 months (FASTING)  Diabetes and Foot Care Diabetes may cause you to have problems because of poor blood supply (circulation) to your feet and legs. This may cause the skin on your feet to become thinner, break easier, and heal more slowly. Your skin may become dry, and the skin may peel and crack. You may also have nerve damage in your legs and feet causing decreased feeling in them. You may not notice minor injuries to your feet that could lead to infections or more serious problems. Taking care of your feet is one of the most important things you can do for yourself. Follow these instructions at home:  Wear shoes at all times, even in the house. Do not go barefoot. Bare feet are easily injured.  Check your  feet daily for blisters, cuts, and redness. If you cannot see the bottom of your feet, use a mirror or ask someone for help.  Wash your feet with warm water (do not use hot water) and mild soap. Then pat your feet and the areas between your toes until they are completely dry. Do not soak your feet as this can dry your skin.  Apply a moisturizing lotion or petroleum jelly (that does not contain alcohol and is unscented) to the skin on your feet and to dry, brittle toenails. Do not apply lotion between your toes.  Trim your toenails straight across. Do not dig under them or around the cuticle. File the edges of your nails with an emery board or nail file.  Do not cut corns or calluses or try to remove them with medicine.  Wear clean socks or stockings every day. Make sure they are not too tight. Do not wear knee-high stockings since they may decrease blood flow to your legs.  Wear shoes that fit properly and have enough cushioning. To break in new shoes, wear them for just a few hours a day. This prevents you from injuring your feet. Always look in your shoes before you put them on to be sure there are no objects inside.  Do not cross your legs. This may decrease the blood flow to your feet.  If you find a minor scrape, cut, or break in the skin on your feet, keep it and the skin around it clean and dry. These areas may be cleansed with mild soap and water. Do not cleanse the area with peroxide, alcohol, or iodine.  When you remove an adhesive bandage, be sure not to damage the skin around it.  If you have a wound, look at it several times a day to make sure it is healing.  Do not use heating pads or hot water bottles. They may burn your skin. If you have lost feeling in your feet or legs, you may not know it is happening until it is too late.  Make sure your health care provider performs a complete foot exam at least annually or more often if you have foot problems. Report any cuts, sores, or  bruises to your health care provider immediately. Contact a health care provider if:  You have an injury that is not healing.  You have cuts or breaks in the skin.  You have an ingrown nail.  You notice redness on your legs or feet.  You feel burning or tingling in your legs or feet.  You have pain or cramps in your legs and feet.  Your legs or feet are numb.  Your feet always feel cold. Get help right away if:  There is increasing redness, swelling, or pain in or around a wound.  There is a red line that goes up your leg.  Pus is coming from a wound.  You develop a fever or as directed by your health care provider.  You notice a bad smell coming from an ulcer or wound. This information is not intended to replace advice given to you by your health care provider. Make sure you discuss any questions you have with your health care provider. Document Released: 05/07/2000 Document Revised: 10/16/2015 Document Reviewed: 10/17/2012 Elsevier Interactive Patient Education  2017 [employer].

## 2017-03-02 NOTE — Progress Notes (Signed)
Subjective:    Patient ID: Chase Martinez, male    DOB: 1940-12-25, 76 y.o.   MRN: [id number]  DOS:  03/02/2017 Type of visit - description : rov Interval history: Feels great DM: good med compliance, good amb CBGs HTN: good med compliance, wnl amb BPs  Neuropathy, sees the foot doctor regularly. High cholesterol: Good compliance with medications. Had a colonoscopy, report reviewed.   Review of Systems Denies chest pain or difficulty breathing No nausea, vomiting, diarrhea. No dysuria, gross hematuria difficulty urinating  Past Medical History:  Diagnosis Date  . Basal cell carcinoma    dr Ronnald Ramp  . Diabetes mellitus   . Hyperlipidemia   . Hypertension   . 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 Premature atrial contractions     Past Surgical History:  Procedure Laterality Date  . CATARACT EXTRACTION Bilateral 2015  . PILONIDAL CYST EXCISION    . REFRACTIVE SURGERY    . SHOULDER SURGERY     right  . TONSILLECTOMY      Social History   Social History  . Marital status: Married    Spouse name: N/A  . Number of children: 2  . Years of education: N/A   Occupational History  . Retired Therapist, art Engineering geologist)    Social History Main Topics  . Smoking status: Former Smoker    Quit date: 05/24/1966  . Smokeless tobacco: Former Systems developer    Types: Snuff    Quit date: 05/24/1988  . Alcohol use 4.2 - 8.4 oz/week    7 - 14 Glasses of wine per week     Comment: occasional beer and wine  . Drug use: No  . Sexual activity: Yes   Other Topics Concern  . Not on file   Social History Narrative   Household-- pt and wife    Son lives near by, daughter near [place] as of 03/02/2017   No Known Allergies     Medication List       Accurate as of 03/02/17 11:59 PM. Always use your most recent med list.          aspirin 81 MG tablet Take 81 mg by mouth daily.   atorvastatin 20 MG  tablet Commonly known as:  LIPITOR Take 1 tablet (20 mg total) by mouth daily.   diltiazem 240 MG 24 hr tablet Commonly known as:  CARDIZEM LA TAKE 1 TABLET DAILY   Fish Oil 1000 MG Caps Take 1 capsule by mouth daily.   freestyle lancets USE AS DIRECTED TO CHECK BLOOD SUGARS 3 TO 4 TIMES DAILY   FREESTYLE LITE test strip Generic drug:  glucose blood USE TO CHECK BLOOD SUGAR NO MORE THAN TWICE A DAY   furosemide 20 MG tablet Commonly known as:  LASIX Take 1 tablet (20 mg total) by mouth daily.   glimepiride 2 MG tablet Commonly known as:  AMARYL Take 1 tablet (2 mg total) by mouth daily with breakfast.   metFORMIN 1000 MG tablet Commonly known as:  GLUCOPHAGE Take 1 tablet (1,000 mg total) by mouth 2 (two) times daily with a meal.   metoprolol succinate 100 MG 24 hr tablet Commonly known as:  TOPROL XL Take 1 tablet (100 mg total) by mouth daily. Take with or immediately following a meal.   multivitamin per tablet Take 1 tablet by mouth daily.   sitaGLIPtin 100 MG tablet Commonly known  as:  JANUVIA Take 1 tablet (100 mg total) by mouth daily.   tamsulosin 0.4 MG Caps capsule Commonly known as:  FLOMAX Take 1 capsule (0.4 mg total) by mouth daily after supper.          Objective:   Physical Exam BP (!) 122/58 (BP Location: Left Arm, Patient Position: Sitting, Cuff Size: Small)   Pulse (!) 58   Temp 97.8 F (36.6 C) (Oral)   Resp 14   Ht 5\' 11"  (1.803 m)   Wt 215 lb 6 oz (97.7 kg)   SpO2 96%   BMI 30.04 kg/m   General:   Well developed, well nourished . NAD.  Neck: No  thyromegaly  HEENT:  Normocephalic . Face symmetric, atraumatic Lungs:  CTA B Normal respiratory effort, no intercostal retractions, no accessory muscle use. Heart: RRR,  no murmur.  No pretibial edema bilaterally  Abdomen:  Not distended, soft, non-tender. No rebound or rigidity.  No bruit DIABETIC FEET EXAM: No lower extremity edema Normal pedal pulses bilaterally Skin  hyperpigmented, right toenails thick, several toes and foot deformities.   Pinprick examination of the feet: Patchy decrease in sensitivity, mostly at the right plantar area. Neurologic:  alert & oriented X3.  Speech normal, gait appropriate for age and unassisted Strength symmetric and appropriate for age.  Psych: Cognition and judgment appear intact.  Cooperative with normal attention span and concentration.  Behavior appropriate. No anxious or depressed appearing.    Assessment & Plan:   Assessment: DM--  + neuropathy HTN Hyperlipidemia PVCs  -- DR Thomasena Edis 03-2015, on CCB,BB BPH BCC Dr. Ronnald Ramp Nail dystrophy   PLAN: DM: Continue with glimepiride, metformin. Januvia. CBGs ranged from 95-148. Checking an A1c , micro and TSH. Neuropathy and feet deformity: Takes great care of his feet, sees the foot doctor regularly. HTN: Continue with Cardizem, Lasix, metoprolol. Check a BMP and CBC High cholesterol: Continue with Lipitor, last FLP satisfactory, check LFTs. BPH: [facility] Preventive care discussed   RTC 6 months

## 2017-03-02 NOTE — Progress Notes (Signed)
Subjective:   Chase Martinez is a 76 y.o. male who presents for Medicare Annual/Subsequent preventive examination.  Review of Systems:  No ROS.  Medicare Wellness Visit. Additional risk factors are reflected in the social history.  Cardiac Risk Factors include: advanced age (>23men, >50 women);diabetes mellitus;dyslipidemia;hypertension;male gender Sleep patterns: Sleeps well. Feels rested   Male:   CCS-   Last 08/17/16. No recall due to age. PSA-  Lab Results  Component Value Date   PSA 1.89 01/06/2016   PSA 1.71 10/30/2013   PSA 2.04 05/03/2011       Objective:    Vitals: BP (!) 122/58 (BP Location: Left Arm, Patient Position: Sitting, Cuff Size: Small)   Pulse (!) 58   Temp 97.8 F (36.6 C) (Oral)   Resp 14   Ht 5\' 11"  (1.803 m)   Wt 215 lb 6 oz (97.7 kg)   SpO2 96%   BMI 30.04 kg/m   Body mass index is 30.04 kg/m.  Tobacco History  Smoking Status  . Former Smoker  . Quit date: 05/24/1966  Smokeless Tobacco  . Former Systems developer  . Types: Snuff  . Quit date: 05/24/1988     Counseling given: No   Past Medical History:  Diagnosis Date  . Basal cell carcinoma    dr Ronnald Ramp  . Diabetes mellitus   . Hyperlipidemia   . Hypertension   . 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 Premature atrial contractions    Past Surgical History:  Procedure Laterality Date  . CATARACT EXTRACTION Bilateral 2015  . PILONIDAL CYST EXCISION    . REFRACTIVE SURGERY    . SHOULDER SURGERY     right  . TONSILLECTOMY     Family History  Problem Relation Age of Onset  . Hypertension Mother   . Glaucoma Mother   . Dementia Father   . Parkinsonism Sister   . Thyroid cancer Son   . Coronary artery disease Neg Hx   . Diabetes Neg Hx   . Stroke Neg Hx   . Prostate cancer Neg Hx   . Colon cancer Neg Hx   . Esophageal cancer Neg Hx   . Stomach cancer Neg Hx    History  Sexual Activity  . Sexual activity: Yes    Outpatient  Encounter Prescriptions as of 03/02/2017  Medication Sig  . aspirin 81 MG tablet Take 81 mg by mouth daily.    Marland Kitchen atorvastatin (LIPITOR) 20 MG tablet Take 1 tablet (20 mg total) by mouth daily.  Marland Kitchen diltiazem (CARDIZEM LA) 240 MG 24 hr tablet TAKE 1 TABLET DAILY  . FREESTYLE LITE test strip USE TO CHECK BLOOD SUGAR NO MORE THAN TWICE A DAY  . furosemide (LASIX) 20 MG tablet Take 1 tablet (20 mg total) by mouth daily.  Marland Kitchen glimepiride (AMARYL) 2 MG tablet Take 1 tablet (2 mg total) by mouth daily with breakfast.  . Lancets (FREESTYLE) lancets USE AS DIRECTED TO CHECK BLOOD SUGARS 3 TO 4 TIMES DAILY  . metFORMIN (GLUCOPHAGE) 1000 MG tablet Take 1 tablet (1,000 mg total) by mouth 2 (two) times daily with a meal.  . metoprolol succinate (TOPROL XL) 100 MG 24 hr tablet Take 1 tablet (100 mg total) by mouth daily. Take with or immediately following a meal.  . multivitamin (THERAGRAN) per tablet Take 1 tablet by mouth daily.    . Omega-3 Fatty Acids (FISH OIL) 1000 MG CAPS Take 1 capsule by mouth daily.   Marland Kitchen  sitaGLIPtin (JANUVIA) 100 MG tablet Take 1 tablet (100 mg total) by mouth daily.  . tamsulosin (FLOMAX) 0.4 MG CAPS capsule Take 1 capsule (0.4 mg total) by mouth daily after supper.   Facility-Administered Encounter Medications as of 03/02/2017  Medication  . 0.9 %  sodium chloride infusion    Activities of Daily Living In your present state of health, do you have any difficulty performing the following activities: 03/02/2017 04/06/2016  Hearing? N N  Vision? N N  Comment hx cataract sx. wears readers -  Difficulty concentrating or making decisions? N N  Walking or climbing stairs? N N  Dressing or bathing? N N  Doing errands, shopping? N N  Preparing Food and eating ? N -  Using the Toilet? N -  In the past six months, have you accidently leaked urine? N -  Do you have problems with loss of bowel control? N -  Managing your Medications? N -  Managing your Finances? N -  Housekeeping or  managing your Housekeeping? N -  Some recent data might be hidden    Patient Care Team: Colon Branch, MD as PCP - General Francee Piccolo, MD as Consulting Physician (Podiatry) Sueanne Margarita, MD as Consulting Physician (Cardiology)   Assessment:    Physical assessment deferred to PCP.  Exercise Activities and Dietary recommendations Current Exercise Habits: Home exercise routine, Type of exercise: walking, Time (Minutes): 30, Frequency (Times/Week): 5, Weekly Exercise (Minutes/Week): 150, Intensity: Mild, Exercise limited by: None identified Diet (meal preparation, eat out, water intake, caffeinated beverages, dairy products, fruits and vegetables): in general, a "healthy" diet     Goals      Patient Stated   . Maintain current health. (pt-stated)      Fall Risk Fall Risk  03/02/2017 04/06/2016 01/06/2016 07/03/2015 12/30/2014  Falls in the past year? No No No No No   Depression Screen PHQ 2/9 Scores 03/02/2017 04/06/2016 01/06/2016 07/03/2015  PHQ - 2 Score 0 0 0 0    Cognitive Function Ad8 score reviewed for issues:  Issues making decisions:no  Less interest in hobbies / activities:no  Repeats questions, stories (family complaining):no  Trouble using ordinary gadgets (microwave, computer, phone):no  Forgets the month or year: no  Mismanaging finances: no  Remembering appts:no  Daily problems with thinking and/or memory:no Ad8 score is=0        Immunization History  Administered Date(s) Administered  . DTaP 06/21/2011  . H1N1 06/11/2008  . Hepatitis A 12/23/2015  . Influenza Split 02/22/2012  . Influenza Whole 04/05/2007, 02/13/2008, 02/18/2009, 01/20/2010  . Influenza, High Dose Seasonal PF 04/04/2013, 03/02/2017  . Influenza,inj,Quad PF,6+ Mos 03/07/2014  . Influenza-Unspecified 03/06/2015, 02/20/2016  . Pneumococcal Conjugate-13 10/30/2013  . Pneumococcal Polysaccharide-23 05/24/2002, 09/28/2007, 08/05/2016  . Td 05/24/2001, 05/21/2013  . Typhoid  Inactivated 12/23/2015  . Yellow Fever 12/23/2015  . Zoster 05/24/2002   Screening Tests Health Maintenance  Topic Date Due  . OPHTHALMOLOGY EXAM  10/13/2016  . URINE MICROALBUMIN  01/05/2017  . HEMOGLOBIN A1C  02/16/2017  . FOOT EXAM  03/02/2018  . TETANUS/TDAP  05/22/2023  . INFLUENZA VACCINE  Completed  . PNA vac Low Risk Adult  Completed      Plan:   Follow up with PCP as directed.  Continue to eat heart healthy diet (full of fruits, vegetables, whole grains, lean protein, water--limit salt, fat, and sugar intake) and increase physical activity as tolerated.  Continue doing brain stimulating activities (puzzles, reading, adult coloring books, staying active)  to keep memory sharp.   Bring a copy of your living will and/or healthcare power of attorney to your next office visit.   I have personally reviewed and noted the following in the patient's chart:   . Medical and social history . Use of alcohol, tobacco or illicit drugs  . Current medications and supplements . Functional ability and status . Nutritional status . Physical activity . Advanced directives . List of other physicians . Hospitalizations, surgeries, and ER visits in previous 12 months . Vitals . Screenings to include cognitive, depression, and falls . Referrals and appointments  In addition, I have reviewed and discussed with patient certain preventive protocols, quality metrics, and best practice recommendations. A written personalized care plan for preventive services as well as general preventive health recommendations were provided to patient.     Shela Nevin, RN  03/02/2017  Kathlene November

## 2017-03-03 LAB — BASIC METABOLIC PANEL
BUN: 18 mg/dL (ref 6–23)
CALCIUM: 9.2 mg/dL (ref 8.4–10.5)
CO2: 29 meq/L (ref 19–32)
CREATININE: 0.95 mg/dL (ref 0.40–1.50)
Chloride: 102 mEq/L (ref 96–112)
GFR: 81.41 mL/min (ref >60.00)
Glucose, Bld: 136 mg/dL — ABNORMAL HIGH (ref 70–99)
Potassium: 4.9 mEq/L (ref 3.5–5.1)
Sodium: 137 mEq/L (ref 135–145)

## 2017-03-03 LAB — CBC WITH DIFFERENTIAL/PLATELET
Basophils Absolute: 0 10*3/uL (ref 0.0–0.1)
Basophils Relative: 0.6 % (ref 0.0–3.0)
EOS PCT: 5.1 % — AB (ref 0.0–5.0)
Eosinophils Absolute: 0.4 10*3/uL (ref 0.0–0.7)
HCT: 36.6 % — ABNORMAL LOW (ref 39.0–52.0)
Hemoglobin: 12.5 g/dL — ABNORMAL LOW (ref 13.0–17.0)
LYMPHS ABS: 1.7 10*3/uL (ref 0.7–4.0)
Lymphocytes Relative: 22 % (ref 12.0–46.0)
MCHC: 34.3 g/dL (ref 30.0–36.0)
MCV: 94.3 fl (ref 78.0–100.0)
MONO ABS: 0.7 10*3/uL (ref 0.1–1.0)
MONOS PCT: 9.5 % (ref 3.0–12.0)
NEUTROS ABS: 4.8 10*3/uL (ref 1.4–7.7)
NEUTROS PCT: 62.8 % (ref 43.0–77.0)
PLATELETS: 286 10*3/uL (ref 150.0–400.0)
RBC: 3.88 Mil/uL — AB (ref 4.22–5.81)
RDW: 13.1 % (ref 11.5–15.5)
WBC: 7.6 10*3/uL (ref 4.0–10.5)

## 2017-03-03 LAB — HEMOGLOBIN A1C: HEMOGLOBIN A1C: 6.8 % — AB (ref 4.6–6.5)

## 2017-03-03 LAB — MICROALBUMIN / CREATININE URINE RATIO
CREATININE, U: 137.5 mg/dL
Microalb Creat Ratio: 0.6 mg/g (ref 0.0–30.0)
Microalb, Ur: 0.8 mg/dL (ref 0.0–1.9)

## 2017-03-03 LAB — ALT: ALT: 28 U/L (ref 0–53)

## 2017-03-03 LAB — TSH: TSH: 1.6 u[IU]/mL (ref 0.35–4.50)

## 2017-03-03 LAB — AST: AST: 22 U/L (ref 0–37)

## 2017-03-03 NOTE — Assessment & Plan Note (Signed)
DM: Continue with glimepiride, metformin. Januvia. CBGs ranged from 95-148. Checking an A1c , micro and TSH. Neuropathy and feet deformity: Takes great care of his feet, sees the foot doctor regularly. HTN: Continue with Cardizem, Lasix, metoprolol. Check a BMP and CBC High cholesterol: Continue with Lipitor, last FLP satisfactory, check LFTs. BPH: Asxc Preventive care discussed   RTC 6 months

## 2017-03-06 ENCOUNTER — Other Ambulatory Visit: Payer: Self-pay | Admitting: Internal Medicine

## 2017-03-11 ENCOUNTER — Other Ambulatory Visit: Payer: Self-pay | Admitting: Internal Medicine

## 2017-03-12 ENCOUNTER — Other Ambulatory Visit: Payer: Self-pay | Admitting: Internal Medicine

## 2017-03-29 DIAGNOSIS — M79672 Pain in left foot: Secondary | ICD-10-CM | POA: Diagnosis not present

## 2017-03-29 DIAGNOSIS — L84 Corns and callosities: Secondary | ICD-10-CM | POA: Diagnosis not present

## 2017-03-29 DIAGNOSIS — E1351 Other specified diabetes mellitus with diabetic peripheral angiopathy without gangrene: Secondary | ICD-10-CM | POA: Diagnosis not present

## 2017-03-29 DIAGNOSIS — M79671 Pain in right foot: Secondary | ICD-10-CM | POA: Diagnosis not present

## 2017-03-29 DIAGNOSIS — B351 Tinea unguium: Secondary | ICD-10-CM | POA: Diagnosis not present

## 2017-04-08 ENCOUNTER — Encounter: Payer: Self-pay | Admitting: Cardiology

## 2017-04-14 ENCOUNTER — Other Ambulatory Visit: Payer: Self-pay | Admitting: Cardiology

## 2017-04-18 ENCOUNTER — Other Ambulatory Visit: Payer: Self-pay | Admitting: Internal Medicine

## 2017-04-26 ENCOUNTER — Other Ambulatory Visit: Payer: Self-pay | Admitting: Internal Medicine

## 2017-05-05 ENCOUNTER — Ambulatory Visit: Payer: Medicare Other | Admitting: Cardiology

## 2017-05-15 ENCOUNTER — Other Ambulatory Visit: Payer: Self-pay | Admitting: Internal Medicine

## 2017-05-22 ENCOUNTER — Other Ambulatory Visit: Payer: Self-pay | Admitting: Internal Medicine

## 2017-06-07 DIAGNOSIS — H401231 Low-tension glaucoma, bilateral, mild stage: Secondary | ICD-10-CM | POA: Diagnosis not present

## 2017-06-09 DIAGNOSIS — M79671 Pain in right foot: Secondary | ICD-10-CM | POA: Diagnosis not present

## 2017-06-09 DIAGNOSIS — M79672 Pain in left foot: Secondary | ICD-10-CM | POA: Diagnosis not present

## 2017-06-09 DIAGNOSIS — E1351 Other specified diabetes mellitus with diabetic peripheral angiopathy without gangrene: Secondary | ICD-10-CM | POA: Diagnosis not present

## 2017-06-09 DIAGNOSIS — L84 Corns and callosities: Secondary | ICD-10-CM | POA: Diagnosis not present

## 2017-06-09 DIAGNOSIS — B351 Tinea unguium: Secondary | ICD-10-CM | POA: Diagnosis not present

## 2017-06-16 NOTE — Progress Notes (Signed)
Cardiology Office Note:    Date:  06/17/2017   ID:  Chase Martinez, DOB 1940-08-08, MRN [id number]  PCP:  Colon Branch, MD  Cardiologist:  No primary care provider on file.    Referring MD: Colon Branch, MD   Chief Complaint  Patient presents with  . Hypertension    History of Present Illness:    Chase Martinez is a 77 y.o. male with a hx of PAC's and HTN.  He is here today for followup and is doing well.  He denies any chest pain or pressure, SOB, DOE, PND, orthopnea, LE edema, dizziness, palpitations or syncope. He is compliant with his meds and is tolerating meds with no SE.    Past Medical History:  Diagnosis Date  . Basal cell carcinoma    dr Ronnald Ramp  . Diabetes mellitus   . Hyperlipidemia   . Hypertension   . 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 Premature atrial contractions     Past Surgical History:  Procedure Laterality Date  . CATARACT EXTRACTION Bilateral 2015  . PILONIDAL CYST EXCISION    . REFRACTIVE SURGERY    . SHOULDER SURGERY     right  . TONSILLECTOMY      Current Medications: Current Meds  Medication Sig  . aspirin 81 MG tablet Take 81 mg by mouth daily.    Marland Kitchen atorvastatin (LIPITOR) 20 MG tablet Take 1 tablet (20 mg total) by mouth daily.  Marland Kitchen diltiazem (CARDIZEM LA) 240 MG 24 hr tablet TAKE 1 TABLET DAILY  . FREESTYLE LITE test strip USE TO CHECK BLOOD SUGAR NO MORE THAN TWICE A DAY  . furosemide (LASIX) 20 MG tablet Take 1 tablet (20 mg total) by mouth daily.  Marland Kitchen glimepiride (AMARYL) 2 MG tablet Take 1 tablet (2 mg total) by mouth daily with breakfast.  . Lancets (FREESTYLE) lancets USE AS DIRECTED TO CHECK BLOOD SUGARS 3 TO 4 TIMES DAILY  . metFORMIN (GLUCOPHAGE) 1000 MG tablet Take 1 tablet (1,000 mg total) by mouth 2 (two) times daily with a meal.  . metoprolol succinate (TOPROL-XL) 100 MG 24 hr tablet Take 1 tablet (100 mg total) by mouth daily. Take with or immediately following a meal.  .  multivitamin (THERAGRAN) per tablet Take 1 tablet by mouth daily.    . Omega-3 Fatty Acids (FISH OIL) 1000 MG CAPS Take 1 capsule by mouth daily.   . sitaGLIPtin (JANUVIA) 100 MG tablet Take 1 tablet (100 mg total) by mouth daily.  . tamsulosin (FLOMAX) 0.4 MG CAPS capsule Take 1 capsule (0.4 mg total) by mouth daily after supper.   Current Facility-Administered Medications for the 06/17/17 encounter (Office Visit) with Sueanne Margarita, MD  Medication  . 0.9 %  sodium chloride infusion     Allergies:   Patient has no known allergies.   Social History   Socioeconomic History  . Marital status: Married    Spouse name: None  . Number of children: 2  . Years of education: None  . Highest education level: None  Social Needs  . Financial resource strain: None  . Food insecurity - worry: None  . Food insecurity - inability: None  . Transportation needs - medical: None  . Transportation needs - non-medical: None  Occupational History  . Occupation: Retired Therapist, art Engineering geologist)  Tobacco Use  . Smoking status: Former Smoker    Last attempt to quit: 05/24/1966    Years since quitting:  51.1  . Smokeless tobacco: Former Systems developer    Types: Snuff    Quit date: 05/24/1988  Substance and Sexual Activity  . Alcohol use: Yes    Alcohol/week: 4.2 - 8.4 oz    Types: 7 - 14 Glasses of wine per week    Comment: occasional beer and wine  . Drug use: No  . Sexual activity: Yes  Other Topics Concern  . None  Social History Narrative   Household-- pt and wife    Son lives near by, daughter near [place]     Family History: The patient's family history includes Dementia in his father; Glaucoma in his mother; Hypertension in his mother; Parkinsonism in his sister; Thyroid cancer in his son. There is no history of Coronary artery disease, Diabetes, Stroke, Prostate cancer, Colon cancer, Esophageal cancer, or Stomach cancer.  ROS:   Please see the history of present illness.    ROS  All other systems  reviewed and negative.   EKGs/Labs/Other Studies Reviewed:    The following studies were reviewed today: none  EKG:  EKG is  ordered today.  The ekg ordered today demonstrates sinus bradycardia with no ST changes and first degree AV block  Recent Labs: 03/02/2017: ALT 28; BUN 18; Creatinine, Ser 0.95; Hemoglobin 12.5; Platelets 286.0; Potassium 4.9; Sodium 137; TSH 1.60   Recent Lipid Panel    Component Value Date/Time   CHOL 118 08/16/2016 0755   TRIG 122.0 08/16/2016 0755   TRIG 74 05/31/2006 0933   HDL 33.10 (L) 08/16/2016 0755   CHOLHDL 4 08/16/2016 0755   VLDL 24.4 08/16/2016 0755   LDLCALC 60 08/16/2016 0755    Physical Exam:    VS:  BP 128/60   Pulse (!) 56   Ht 5\' 11"  (1.803 m)   Wt 221 lb 12.8 oz (100.6 kg)   SpO2 96%   BMI 30.93 kg/m     Wt Readings from Last 3 Encounters:  06/17/17 221 lb 12.8 oz (100.6 kg)  03/02/17 215 lb 6 oz (97.7 kg)  08/17/16 221 lb (100.2 kg)     GEN:  Well nourished, well developed in no acute distress HEENT: Normal NECK: No JVD; No carotid bruits LYMPHATICS: No lymphadenopathy CARDIAC: RRR, no murmurs, rubs, gallops RESPIRATORY:  Clear to auscultation without rales, wheezing or rhonchi  ABDOMEN: Soft, non-tender, non-distended MUSCULOSKELETAL:  No edema; No deformity  SKIN: Warm and dry NEUROLOGIC:  Alert and oriented x 3 PSYCHIATRIC:  Normal affect   ASSESSMENT:    1. Essential hypertension   2. Premature atrial contractions    PLAN:    In order of problems listed above:  1.  HTN - BP is well controlled on exam today.  He will continue on Cardizem 240mg  daily and Toprol XL 100mg  daily.   2.  PACS - asymptomatic.  He will continue on BB and CCB for suppression.   Medication Adjustments/Labs and Tests Ordered: Current medicines are reviewed at length with the patient today.  Concerns regarding medicines are outlined above.  No orders of the defined types were placed in this encounter.  No orders of the defined  types were placed in this encounter.   Signed, Fransico Him, MD  06/17/2017 8:04 AM    [place]

## 2017-06-17 ENCOUNTER — Ambulatory Visit (INDEPENDENT_AMBULATORY_CARE_PROVIDER_SITE_OTHER): Payer: Medicare Other | Admitting: Cardiology

## 2017-06-17 ENCOUNTER — Encounter: Payer: Self-pay | Admitting: Cardiology

## 2017-06-17 VITALS — BP 128/60 | HR 56 | Ht 71.0 in | Wt 221.8 lb

## 2017-06-17 DIAGNOSIS — I10 Essential (primary) hypertension: Secondary | ICD-10-CM

## 2017-06-17 DIAGNOSIS — I491 Atrial premature depolarization: Secondary | ICD-10-CM

## 2017-06-17 NOTE — Patient Instructions (Signed)

## 2017-06-17 NOTE — Addendum Note (Signed)
Addended by: Mendel Ryder on: 06/17/2017 01:24 PM   Modules accepted: Orders

## 2017-07-16 ENCOUNTER — Other Ambulatory Visit: Payer: Self-pay | Admitting: Cardiology

## 2017-07-22 ENCOUNTER — Other Ambulatory Visit: Payer: Self-pay | Admitting: Internal Medicine

## 2017-08-09 DIAGNOSIS — L723 Sebaceous cyst: Secondary | ICD-10-CM | POA: Diagnosis not present

## 2017-08-09 DIAGNOSIS — Z85828 Personal history of other malignant neoplasm of skin: Secondary | ICD-10-CM | POA: Diagnosis not present

## 2017-08-09 DIAGNOSIS — L821 Other seborrheic keratosis: Secondary | ICD-10-CM | POA: Diagnosis not present

## 2017-08-09 DIAGNOSIS — L570 Actinic keratosis: Secondary | ICD-10-CM | POA: Diagnosis not present

## 2017-08-16 DIAGNOSIS — B351 Tinea unguium: Secondary | ICD-10-CM | POA: Diagnosis not present

## 2017-08-16 DIAGNOSIS — L84 Corns and callosities: Secondary | ICD-10-CM | POA: Diagnosis not present

## 2017-08-16 DIAGNOSIS — E1351 Other specified diabetes mellitus with diabetic peripheral angiopathy without gangrene: Secondary | ICD-10-CM | POA: Diagnosis not present

## 2017-08-16 DIAGNOSIS — M79672 Pain in left foot: Secondary | ICD-10-CM | POA: Diagnosis not present

## 2017-08-16 DIAGNOSIS — M79671 Pain in right foot: Secondary | ICD-10-CM | POA: Diagnosis not present

## 2017-08-30 ENCOUNTER — Ambulatory Visit: Payer: Medicare Other | Admitting: Internal Medicine

## 2017-09-03 ENCOUNTER — Other Ambulatory Visit: Payer: Self-pay | Admitting: Internal Medicine

## 2017-09-06 ENCOUNTER — Encounter: Payer: Self-pay | Admitting: Internal Medicine

## 2017-09-06 ENCOUNTER — Ambulatory Visit (INDEPENDENT_AMBULATORY_CARE_PROVIDER_SITE_OTHER): Payer: Medicare Other | Admitting: Internal Medicine

## 2017-09-06 VITALS — BP 123/67 | HR 55 | Temp 97.7°F | Resp 16 | Ht 72.0 in | Wt 223.0 lb

## 2017-09-06 DIAGNOSIS — Z77090 Contact with and (suspected) exposure to asbestos: Secondary | ICD-10-CM

## 2017-09-06 DIAGNOSIS — E785 Hyperlipidemia, unspecified: Secondary | ICD-10-CM

## 2017-09-06 DIAGNOSIS — E119 Type 2 diabetes mellitus without complications: Secondary | ICD-10-CM | POA: Diagnosis not present

## 2017-09-06 NOTE — Progress Notes (Signed)
Subjective:    Patient ID: Chase Martinez, male    DOB: [birth year], 77 y.o.   MRN: [id number]  DOS:  09/06/2017 Type of visit - description : Routine visit Interval history: Good compliance with medications. No apparent side effects. 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   Review of Systems  Denies chest pain, difficulty breathing.  No cough Denies symptoms consistent with low sugars He remains very active  Past Medical History:  Diagnosis Date  . Basal cell carcinoma    dr Ronnald Ramp  . Diabetes mellitus   . Hyperlipidemia   . Hypertension   . 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 Premature atrial contractions     Past Surgical History:  Procedure Laterality Date  . CATARACT EXTRACTION Bilateral 2015  . PILONIDAL CYST EXCISION    . REFRACTIVE SURGERY    . SHOULDER SURGERY     right  . TONSILLECTOMY      Social History   Socioeconomic History  . Marital status: Married    Spouse name: Not on file  . Number of children: 2  . Years of education: Not on file  . Highest education level: Not on file  Occupational History  . Occupation: Retired Therapist, art Engineering geologist)  Social Needs  . Financial resource strain: Not on file  . Food insecurity:    Worry: Not on file    Inability: Not on file  . Transportation needs:    Medical: Not on file    Non-medical: Not on file  Tobacco Use  . Smoking status: Former Smoker    Last attempt to quit: 05/24/1966    Years since quitting: 51.3  . Smokeless tobacco: Former Systems developer    Types: Snuff    Quit date: 05/24/1988  Substance and Sexual Activity  . Alcohol use: Yes    Alcohol/week: 4.2 - 8.4 oz    Types: 7 - 14 Glasses of wine per week    Comment: occasional beer and wine  . Drug use: No  . Sexual activity: Yes  Lifestyle  . Physical activity:    Days per week: Not on file    Minutes per session: Not on file  . Stress: Not on  file  Relationships  . Social connections:    Talks on phone: Not on file    Gets together: Not on file    Attends religious service: Not on file    Active member of club or organization: Not on file    Attends meetings of clubs or organizations: Not on file    Relationship status: Not on file  . Intimate partner violence:    Fear of current or ex partner: Not on file    Emotionally abused: Not on file    Physically abused: Not on file    Forced sexual activity: Not on file  Other Topics Concern  . Not on file  Social History Narrative   Household-- pt and wife    Son lives near by, daughter near [place] as of 09/06/2017   No Known Allergies     Medication List        Accurate as of 09/06/17  5:48 PM. Always use your most recent med list.          aspirin 81 MG tablet Take 81 mg by mouth daily.   atorvastatin 20 MG tablet Commonly  known as:  LIPITOR Take 1 tablet (20 mg total) by mouth daily.   diltiazem 240 MG 24 hr tablet Commonly known as:  CARDIZEM LA TAKE 1 TABLET DAILY   Fish Oil 1000 MG Caps Take 1 capsule by mouth daily.   freestyle lancets USE AS DIRECTED TO CHECK BLOOD SUGARS 3 TO 4 TIMES DAILY   FREESTYLE LITE test strip Generic drug:  glucose blood USE TO CHECK BLOOD SUGAR NO MORE THAN TWICE A DAY   furosemide 20 MG tablet Commonly known as:  LASIX Take 1 tablet (20 mg total) by mouth daily.   glimepiride 2 MG tablet Commonly known as:  AMARYL Take 1 tablet (2 mg total) by mouth daily with breakfast.   metFORMIN 1000 MG tablet Commonly known as:  GLUCOPHAGE Take 1 tablet (1,000 mg total) by mouth 2 (two) times daily with a meal.   metoprolol succinate 100 MG 24 hr tablet Commonly known as:  TOPROL-XL Take 1 tablet (100 mg total) by mouth daily. Take with or immediately following a meal.   multivitamin per tablet Take 1 tablet by mouth daily.   sitaGLIPtin 100 MG tablet Commonly known as:  JANUVIA Take 1 tablet  (100 mg total) by mouth daily.   tamsulosin 0.4 MG Caps capsule Commonly known as:  FLOMAX Take 1 capsule (0.4 mg total) by mouth daily after supper.          Objective:   Physical Exam BP 123/67 (BP Location: Right Arm, Patient Position: Sitting, Cuff Size: Small)   Pulse (!) 55   Temp 97.7 F (36.5 C) (Oral)   Resp 16   Ht 6' (1.829 m)   Wt 223 lb (101.2 kg)   SpO2 99%   BMI 30.24 kg/m  General:   Well developed, well nourished . NAD.  HEENT:  Normocephalic . Face symmetric, atraumatic Lungs:  CTA B Normal respiratory effort, no intercostal retractions, no accessory muscle use. Heart: RRR,  no murmur.  No pretibial edema bilaterally  Skin: Not pale. Not jaundice Neurologic:  alert & oriented X3.  Speech normal, gait appropriate for age and unassisted Psych--  Cognition and judgment appear intact.  Cooperative with normal attention span and concentration.  Behavior appropriate. No anxious or depressed appearing.      Assessment & Plan:   Assessment: DM--  + neuropathy HTN Hyperlipidemia PVCs  -- DR Thomasena Edis 03-2015, on CCB,BB BPH BCC Dr. Ronnald Ramp Nail dystrophy   PLAN: DM: Continue glimepiride, metformin, Januvia.  Check a A1c and BMP.  Sees ophthalmology every 6 months Hyperlipidemia: Continue Lipitor, check FLP, will come back fasting Heavy asbestos exposure: per pt. Normal chest x-ray 2017, he is asymptomatic but remains concerned.  He wonders if he needs a CAT scan. Refer to pulmonary in a nonurgent manner. RTC 6 months

## 2017-09-06 NOTE — Assessment & Plan Note (Signed)
DM: Continue glimepiride, metformin, Januvia.  Check a A1c and BMP.  Sees ophthalmology every 6 months Hyperlipidemia: Continue Lipitor, check FLP, will come back fasting Heavy asbestos exposure: per pt. Normal chest x-ray 2017, he is asymptomatic but remains concerned.  He wonders if he needs a CAT scan. Refer to pulmonary in a nonurgent manner. RTC 6 months

## 2017-09-06 NOTE — Patient Instructions (Signed)
   GO TO THE FRONT DESK Schedule your next appointment for a  Check up in 6 months   Schedule labs to be done this week fasting

## 2017-09-07 ENCOUNTER — Other Ambulatory Visit: Payer: Self-pay | Admitting: Internal Medicine

## 2017-09-08 ENCOUNTER — Other Ambulatory Visit (INDEPENDENT_AMBULATORY_CARE_PROVIDER_SITE_OTHER): Payer: Medicare Other

## 2017-09-08 DIAGNOSIS — E785 Hyperlipidemia, unspecified: Secondary | ICD-10-CM | POA: Diagnosis not present

## 2017-09-08 DIAGNOSIS — E119 Type 2 diabetes mellitus without complications: Secondary | ICD-10-CM | POA: Diagnosis not present

## 2017-09-08 LAB — LIPID PANEL
CHOL/HDL RATIO: 4
Cholesterol: 113 mg/dL (ref 0–200)
HDL: 31 mg/dL — ABNORMAL LOW (ref >39.00)
LDL CALC: 59 mg/dL (ref 0–99)
NonHDL: 81.84
TRIGLYCERIDES: 114 mg/dL (ref 0.0–149.0)
VLDL: 22.8 mg/dL (ref 0.0–40.0)

## 2017-09-08 LAB — BASIC METABOLIC PANEL
BUN: 19 mg/dL (ref 6–23)
CALCIUM: 9.2 mg/dL (ref 8.4–10.5)
CO2: 26 mEq/L (ref 19–32)
Chloride: 102 mEq/L (ref 96–112)
Creatinine, Ser: 1.06 mg/dL (ref 0.40–1.50)
GFR: 71.65 mL/min (ref >60.00)
Glucose, Bld: 151 mg/dL — ABNORMAL HIGH (ref 70–99)
POTASSIUM: 4.7 meq/L (ref 3.5–5.1)
Sodium: 134 mEq/L — ABNORMAL LOW (ref 135–145)

## 2017-09-08 LAB — HEMOGLOBIN A1C: Hgb A1c MFr Bld: 7 % — ABNORMAL HIGH (ref 4.6–6.5)

## 2017-09-10 ENCOUNTER — Other Ambulatory Visit: Payer: Self-pay | Admitting: Internal Medicine

## 2017-09-27 DIAGNOSIS — B351 Tinea unguium: Secondary | ICD-10-CM | POA: Diagnosis not present

## 2017-09-27 DIAGNOSIS — M79671 Pain in right foot: Secondary | ICD-10-CM | POA: Diagnosis not present

## 2017-09-27 DIAGNOSIS — E1351 Other specified diabetes mellitus with diabetic peripheral angiopathy without gangrene: Secondary | ICD-10-CM | POA: Diagnosis not present

## 2017-09-27 DIAGNOSIS — L84 Corns and callosities: Secondary | ICD-10-CM | POA: Diagnosis not present

## 2017-10-04 ENCOUNTER — Ambulatory Visit (INDEPENDENT_AMBULATORY_CARE_PROVIDER_SITE_OTHER): Payer: Medicare Other | Admitting: Pulmonary Disease

## 2017-10-04 ENCOUNTER — Encounter: Payer: Self-pay | Admitting: Pulmonary Disease

## 2017-10-04 VITALS — BP 102/62 | HR 62 | Ht 72.0 in | Wt 219.0 lb

## 2017-10-04 DIAGNOSIS — Z77090 Contact with and (suspected) exposure to asbestos: Secondary | ICD-10-CM

## 2017-10-04 DIAGNOSIS — J849 Interstitial pulmonary disease, unspecified: Secondary | ICD-10-CM | POA: Diagnosis not present

## 2017-10-04 NOTE — Progress Notes (Signed)
Chase Martinez    [id number]    [birth year]  Primary Care Physician:Paz, Alda Berthold, MD  Referring Physician: Colon Branch, MD [address] [place], [place] [address]  Chief complaint: Consult for evaluation of asbestosis  HPI: 77 year old with history of asbestos exposure, hypertension, hyperlipidemia, diabetes.  He is here for evaluation of asbestos-related lung disease.  Reports significant asbestos exposure while working in [employer] Denies any pulmonary symptoms.  No dyspnea, cough, sputum production, wheezing.  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  Later worked in Sports coach at [employer] Exposures: Reports exposure to asbestos while in [employer].  No mold, hot tub, Jacuzzi. Smoking history: 20-pack-year smoking history.  Quit in 1968 Travel history: Lived in [place], [place], [place].  Recent travel to [place]. Relevant family history: No significant family history of lung issues.  Outpatient Encounter Medications as of 10/04/2017  Medication Sig  . aspirin 81 MG tablet Take 81 mg by mouth daily.    Marland Kitchen atorvastatin (LIPITOR) 20 MG tablet Take 1 tablet (20 mg total) by mouth daily.  Marland Kitchen diltiazem (CARDIZEM LA) 240 MG 24 hr tablet TAKE 1 TABLET DAILY  . FREESTYLE LITE test strip USE TO CHECK BLOOD SUGAR NO MORE THAN TWICE A DAY  . furosemide (LASIX) 20 MG tablet Take 1 tablet (20 mg total) by mouth daily.  Marland Kitchen glimepiride (AMARYL) 2 MG tablet Take 1 tablet (2 mg total) by mouth daily with breakfast.  . Lancets (FREESTYLE) lancets USE AS DIRECTED TO CHECK BLOOD SUGARS 3 TO 4 TIMES DAILY  . metFORMIN (GLUCOPHAGE) 1000 MG tablet Take 1 tablet (1,000 mg total) by mouth 2 (two) times daily with a meal.  . metoprolol succinate (TOPROL XL) 100 MG 24 hr tablet Take 1 tablet (100 mg total) by mouth daily. Take with or immediately following a meal.  . multivitamin (THERAGRAN) per tablet Take 1 tablet  by mouth daily.    . Omega-3 Fatty Acids (FISH OIL) 1000 MG CAPS Take 1 capsule by mouth daily.   . sitaGLIPtin (JANUVIA) 100 MG tablet Take 1 tablet (100 mg total) by mouth daily.  . tamsulosin (FLOMAX) 0.4 MG CAPS capsule Take 1 capsule (0.4 mg total) by mouth daily after supper.   No facility-administered encounter medications on file as of 10/04/2017.     Allergies as of 10/04/2017  . (No Known Allergies)    Past Medical History:  Diagnosis Date  . Basal cell carcinoma    dr Ronnald Ramp  . Diabetes mellitus   . Hyperlipidemia   . Hypertension   . 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 Premature atrial contractions     Past Surgical History:  Procedure Laterality Date  . CATARACT EXTRACTION Bilateral 2015  . PILONIDAL CYST EXCISION    . REFRACTIVE SURGERY    . SHOULDER SURGERY     right  . TONSILLECTOMY      Family History  Problem Relation Age of Onset  . Hypertension Mother   . Glaucoma Mother   . Dementia Father   . Parkinsonism Sister   . Thyroid cancer Son   . Coronary artery disease Neg Hx   . Diabetes Neg Hx   . Stroke Neg Hx   . Prostate cancer Neg Hx   . Colon cancer Neg Hx   . Esophageal cancer Neg Hx   .  Stomach cancer Neg Hx     Social History   Socioeconomic History  . Marital status: Married    Spouse name: Not on file  . Number of children: 2  . Years of education: Not on file  . Highest education level: Not on file  Occupational History  . Occupation: Retired Therapist, art Engineering geologist)  Social Needs  . Financial resource strain: Not on file  . Food insecurity:    Worry: Not on file    Inability: Not on file  . Transportation needs:    Medical: Not on file    Non-medical: Not on file  Tobacco Use  . Smoking status: Former Smoker    Packs/day: 1.00    Types: Cigarettes, Cigars    Last attempt to quit: 05/24/1966    Years since quitting: 51.4  . Smokeless tobacco: Former Systems developer    Types: Snuff      Quit date: 05/24/1988  Substance and Sexual Activity  . Alcohol use: Yes    Alcohol/week: 4.2 - 8.4 oz    Types: 7 - 14 Glasses of wine per week    Comment: occasional beer and wine  . Drug use: No  . Sexual activity: Yes  Lifestyle  . Physical activity:    Days per week: Not on file    Minutes per session: Not on file  . Stress: Not on file  Relationships  . Social connections:    Talks on phone: Not on file    Gets together: Not on file    Attends religious service: Not on file    Active member of club or organization: Not on file    Attends meetings of clubs or organizations: Not on file    Relationship status: Not on file  . Intimate partner violence:    Fear of current or ex partner: Not on file    Emotionally abused: Not on file    Physically abused: Not on file    Forced sexual activity: Not on file  Other Topics Concern  . Not on file  Social History Narrative   Household-- pt and wife    Son lives near by, daughter near [place] of systems: Review of Systems  Constitutional: Negative for fever and chills.  HENT: Negative.   Eyes: Negative for blurred vision.  Respiratory: as per HPI  Cardiovascular: Negative for chest pain and palpitations.  Gastrointestinal: Negative for vomiting, diarrhea, blood per rectum. Genitourinary: Negative for dysuria, urgency, frequency and hematuria.  Musculoskeletal: Negative for myalgias, back pain and joint pain.  Skin: Negative for itching and rash.  Neurological: Negative for dizziness, tremors, focal weakness, seizures and loss of consciousness.  Endo/Heme/Allergies: Negative for environmental allergies.  Psychiatric/Behavioral: Negative for depression, suicidal ideas and hallucinations.  All other systems reviewed and are negative.  Physical Exam: Blood pressure 102/62, pulse 62, height 6' (1.829 m), weight 219 lb (99.3 kg), SpO2 96 %. Gen:      No acute distress HEENT:  EOMI, sclera anicteric Neck:      No masses; no thyromegaly Lungs:    Clear to auscultation bilaterally; normal respiratory effort CV:         Regular rate and rhythm; no murmurs Abd:      + bowel sounds; soft, non-tender; no palpable masses, no distension Ext:    No edema; adequate peripheral perfusion Skin:      Warm and dry; no rash Neuro: alert and oriented x 3 Psych: normal mood and affect  Data Reviewed: CT chest 07/10/2009- dependent bibasilar atelectasis. Chest x-ray 02/05/2016- no acute cardia pulmonary abnormality I reviewed the images personally.  Assessment:  Evaluation for asbestosis. He has significant asbestos exposure while in the [employer].  CT scan from 2011 shows dependent atelectasis in the lungs.  I wonder if he was developing pulmonary fibrosis back then.  Chest x-ray in 2017 noted with no acute abnormality the patient is asymptomatic Evaluate with a high-resolution CT and pulmonary function test.  Plan/Recommendations: -High-resolution CT, pulmonary function test  Marshell Garfinkel MD [facility] 10/04/2017, 9:31 AM  CC: Colon Branch, MD

## 2017-10-04 NOTE — Patient Instructions (Signed)
We will schedule you for high-resolution CT of the chest to get a better look at the lungs and pulmonary function test I will give you a call to discuss the results and see in clinic in about 1 month

## 2017-10-18 ENCOUNTER — Other Ambulatory Visit: Payer: Self-pay | Admitting: Internal Medicine

## 2017-10-20 ENCOUNTER — Ambulatory Visit (INDEPENDENT_AMBULATORY_CARE_PROVIDER_SITE_OTHER)
Admission: RE | Admit: 2017-10-20 | Discharge: 2017-10-20 | Disposition: A | Discharge status: Home / Self Care | Payer: Medicare Other | Source: Ambulatory Visit | Attending: Pulmonary Disease | Admitting: Pulmonary Disease

## 2017-10-20 DIAGNOSIS — J849 Interstitial pulmonary disease, unspecified: Secondary | ICD-10-CM | POA: Diagnosis not present

## 2017-10-23 ENCOUNTER — Other Ambulatory Visit: Payer: Self-pay | Admitting: Internal Medicine

## 2017-11-12 ENCOUNTER — Other Ambulatory Visit: Payer: Self-pay | Admitting: Internal Medicine

## 2017-11-17 ENCOUNTER — Encounter: Payer: Self-pay | Admitting: Pulmonary Disease

## 2017-11-17 ENCOUNTER — Ambulatory Visit (INDEPENDENT_AMBULATORY_CARE_PROVIDER_SITE_OTHER): Payer: Medicare Other | Admitting: Pulmonary Disease

## 2017-11-17 VITALS — BP 100/60 | HR 65 | Ht 72.0 in | Wt 218.0 lb

## 2017-11-17 DIAGNOSIS — Z77090 Contact with and (suspected) exposure to asbestos: Secondary | ICD-10-CM | POA: Diagnosis not present

## 2017-11-17 DIAGNOSIS — J849 Interstitial pulmonary disease, unspecified: Secondary | ICD-10-CM

## 2017-11-17 LAB — PULMONARY FUNCTION TEST
DL/VA % PRED: 90 %
DL/VA: 4.29 ml/min/mmHg/L
DLCO UNC % PRED: 75 %
DLCO UNC: 27.42 ml/min/mmHg
FEF 25-75 POST: 3.52 L/s
FEF 25-75 PRE: 2.89 L/s
FEF2575-%Change-Post: 21 %
FEF2575-%PRED-PRE: 125 %
FEF2575-%Pred-Post: 152 %
FEV1-%Change-Post: 4 %
FEV1-%Pred-Post: 104 %
FEV1-%Pred-Pre: 99 %
FEV1-Post: 3.43 L
FEV1-Pre: 3.28 L
FEV1FVC-%Change-Post: 5 %
FEV1FVC-%PRED-PRE: 106 %
FEV6-%CHANGE-POST: 1 %
FEV6-%PRED-POST: 97 %
FEV6-%Pred-Pre: 96 %
FEV6-POST: 4.2 L
FEV6-Pre: 4.15 L
FEV6FVC-%CHANGE-POST: 1 %
FEV6FVC-%PRED-POST: 106 %
FEV6FVC-%Pred-Pre: 104 %
FVC-%Change-Post: -1 %
FVC-%Pred-Post: 92 %
FVC-%Pred-Pre: 93 %
FVC-Post: 4.21 L
FVC-Pre: 4.27 L
POST FEV1/FVC RATIO: 81 %
PRE FEV1/FVC RATIO: 77 %
Post FEV6/FVC ratio: 100 %
Pre FEV6/FVC Ratio: 98 %
RV % pred: 102 %
RV: 2.87 L
TLC % PRED: 91 %
TLC: 7.04 L

## 2017-11-17 NOTE — Progress Notes (Signed)
Chase Martinez    [id number]    1941-03-30  Primary Care Physician:Paz, Alda Berthold, MD  Referring Physician: Colon Branch, MD [address] [place], [place] [address]  Chief complaint: Follow-up for asbestosis  HPI: 77 year old with history of asbestos exposure, hypertension, hyperlipidemia, diabetes.  He is here for evaluation of asbestos-related lung disease.  Reports significant asbestos exposure while working in [employer] Denies any pulmonary symptoms.  No dyspnea, cough, sputum production, wheezing.  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  Later worked in Sports coach at [employer] Exposures: Reports exposure to asbestos while in [employer].  No mold, hot tub, Jacuzzi. Smoking history: 20-pack-year smoking history.  Quit in 1968 Travel history: Lived in [place], [place], [place].  Recent travel to [place]. Relevant family history: No significant family history of lung issues.  Interim history: He is here for review of his CT scan and PFTs.  States that he is doing well with no complaints of dyspnea, cough, sputum production.  Outpatient Encounter Medications as of 11/17/2017  Medication Sig  . aspirin 81 MG tablet Take 81 mg by mouth daily.    Marland Kitchen atorvastatin (LIPITOR) 20 MG tablet Take 1 tablet (20 mg total) by mouth daily.  Marland Kitchen diltiazem (CARDIZEM LA) 240 MG 24 hr tablet TAKE 1 TABLET DAILY  . FREESTYLE LITE test strip USE TO CHECK BLOOD SUGAR NO MORE THAN TWICE A DAY  . furosemide (LASIX) 20 MG tablet Take 1 tablet (20 mg total) by mouth daily.  Marland Kitchen glimepiride (AMARYL) 2 MG tablet Take 1 tablet (2 mg total) by mouth daily with breakfast.  . Lancets (FREESTYLE) lancets USE AS DIRECTED TO CHECK BLOOD SUGARS 3 TO 4 TIMES DAILY  . metFORMIN (GLUCOPHAGE) 1000 MG tablet Take 1 tablet (1,000 mg total) by mouth 2 (two) times daily with a meal.  . metoprolol succinate (TOPROL XL) 100 MG 24 hr tablet  Take 1 tablet (100 mg total) by mouth daily. Take with or immediately following a meal.  . multivitamin (THERAGRAN) per tablet Take 1 tablet by mouth daily.    . Omega-3 Fatty Acids (FISH OIL) 1000 MG CAPS Take 1 capsule by mouth daily.   . sitaGLIPtin (JANUVIA) 100 MG tablet Take 1 tablet (100 mg total) by mouth daily.  . tamsulosin (FLOMAX) 0.4 MG CAPS capsule Take 1 capsule (0.4 mg total) by mouth daily after supper.   No facility-administered encounter medications on file as of 11/17/2017.     Allergies as of 11/17/2017  . (No Known Allergies)    Past Medical History:  Diagnosis Date  . Basal cell carcinoma    dr Ronnald Ramp  . Diabetes mellitus   . Hyperlipidemia   . Hypertension   . 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 Premature atrial contractions     Past Surgical History:  Procedure Laterality Date  . CATARACT EXTRACTION Bilateral 2015  . PILONIDAL CYST EXCISION    . REFRACTIVE SURGERY    . SHOULDER SURGERY     right  . TONSILLECTOMY      Family History  Problem Relation Age of Onset  . Hypertension Mother   . Glaucoma Mother   . Dementia Father   . Parkinsonism Sister   . Thyroid cancer Son   . Coronary artery disease Neg Hx   . Diabetes Neg Hx   .  Stroke Neg Hx   . Prostate cancer Neg Hx   . Colon cancer Neg Hx   . Esophageal cancer Neg Hx   . Stomach cancer Neg Hx     Social History   Socioeconomic History  . Marital status: Married    Spouse name: Not on file  . Number of children: 2  . Years of education: Not on file  . Highest education level: Not on file  Occupational History  . Occupation: Retired Therapist, art Engineering geologist)  Social Needs  . Financial resource strain: Not on file  . Food insecurity:    Worry: Not on file    Inability: Not on file  . Transportation needs:    Medical: Not on file    Non-medical: Not on file  Tobacco Use  . Smoking status: Former Smoker    Packs/day: 1.00     Types: Cigarettes, Cigars    Last attempt to quit: 05/24/1966    Years since quitting: 51.5  . Smokeless tobacco: Former Systems developer    Types: Snuff    Quit date: 05/24/1988  Substance and Sexual Activity  . Alcohol use: Yes    Alcohol/week: 4.2 - 8.4 oz    Types: 7 - 14 Glasses of wine per week    Comment: occasional beer and wine  . Drug use: No  . Sexual activity: Yes  Lifestyle  . Physical activity:    Days per week: Not on file    Minutes per session: Not on file  . Stress: Not on file  Relationships  . Social connections:    Talks on phone: Not on file    Gets together: Not on file    Attends religious service: Not on file    Active member of club or organization: Not on file    Attends meetings of clubs or organizations: Not on file    Relationship status: Not on file  . Intimate partner violence:    Fear of current or ex partner: Not on file    Emotionally abused: Not on file    Physically abused: Not on file    Forced sexual activity: Not on file  Other Topics Concern  . Not on file  Social History Narrative   Household-- pt and wife    Son lives near by, daughter near [place] of systems: Review of Systems  Constitutional: Negative for fever and chills.  HENT: Negative.   Eyes: Negative for blurred vision.  Respiratory: as per HPI  Cardiovascular: Negative for chest pain and palpitations.  Gastrointestinal: Negative for vomiting, diarrhea, blood per rectum. Genitourinary: Negative for dysuria, urgency, frequency and hematuria.  Musculoskeletal: Negative for myalgias, back pain and joint pain.  Skin: Negative for itching and rash.  Neurological: Negative for dizziness, tremors, focal weakness, seizures and loss of consciousness.  Endo/Heme/Allergies: Negative for environmental allergies.  Psychiatric/Behavioral: Negative for depression, suicidal ideas and hallucinations.  All other systems reviewed and are negative.  Physical Exam: Blood pressure  100/60, pulse 65, height 6' (1.829 m), weight 218 lb (98.9 kg), SpO2 96 %. Gen:      No acute distress HEENT:  EOMI, sclera anicteric Neck:     No masses; no thyromegaly Lungs:    Clear to auscultation bilaterally; normal respiratory effort CV:         Regular rate and rhythm; no murmurs Abd:      + bowel sounds; soft, non-tender; no palpable masses, no distension Ext:    No edema;  adequate peripheral perfusion Skin:      Warm and dry; no rash Neuro: alert and oriented x 3 Psych: normal mood and affect  Data Reviewed: CT chest 07/10/2009- dependent bibasilar atelectasis. High-resolution CT 10/20/17- minimal septal thickening, subpleural reticulation at the bases.  Mild air trapping.  Tiny calcified granulomas, small pleural plaques Hepatic steatosis, coronary atherosclerosis. I reviewed the images personally.  PFTs 11/17/2017 FVC 4.21 [92%), FEV1 3.43 [104%), F/F 81, TLC 91%, DLCO 75%, DLCO/VA 90% Minimal diffusion defect  Assessment:  Asbestosis. Review of CT scan and PFT shows minimal scarring at the bases and minimal diffusion defect consistent with asbestosis He is asymptomatic and we will continue monitoring him CT also shows hepatic steatosis and coronary atherosclerosis.  He follow-up with his primary care and cardiologist regarding this.  Repeat spirometry and diffusion capacity in 1 year.  If there is worsening then consider repeat CT chest  Health maintenance 03/02/2017-influenza 10/30/2013-Prevnar 08/05/2016-Pneumovax  Plan/Recommendations: - Follow-up spirometry, diffusion capacity in 1 year  Marshell Garfinkel MD [facility] 11/17/2017, 9:39 AM  CC: Colon Branch, MD

## 2017-11-17 NOTE — Progress Notes (Signed)
PFT done today. 

## 2017-11-17 NOTE — Patient Instructions (Signed)
The PFTs and CT scan shows minimal changes consistent with asbestos exposure.  I do not see any alarming or worrisome findings.  We will continue to monitor this I will schedule you for a repeat spirometry and diffusion capacity in 1 year and follow-up in clinic after test.

## 2017-11-19 ENCOUNTER — Other Ambulatory Visit: Payer: Self-pay | Admitting: Internal Medicine

## 2017-12-05 ENCOUNTER — Other Ambulatory Visit: Payer: Self-pay | Admitting: Internal Medicine

## 2017-12-06 DIAGNOSIS — H401231 Low-tension glaucoma, bilateral, mild stage: Secondary | ICD-10-CM | POA: Diagnosis not present

## 2017-12-06 DIAGNOSIS — Z961 Presence of intraocular lens: Secondary | ICD-10-CM | POA: Diagnosis not present

## 2017-12-06 DIAGNOSIS — H43813 Vitreous degeneration, bilateral: Secondary | ICD-10-CM | POA: Diagnosis not present

## 2017-12-06 LAB — HM DIABETES EYE EXAM

## 2017-12-08 DIAGNOSIS — L84 Corns and callosities: Secondary | ICD-10-CM | POA: Diagnosis not present

## 2017-12-08 DIAGNOSIS — M21962 Unspecified acquired deformity of left lower leg: Secondary | ICD-10-CM | POA: Diagnosis not present

## 2017-12-08 DIAGNOSIS — E1351 Other specified diabetes mellitus with diabetic peripheral angiopathy without gangrene: Secondary | ICD-10-CM | POA: Diagnosis not present

## 2017-12-08 DIAGNOSIS — M79672 Pain in left foot: Secondary | ICD-10-CM | POA: Diagnosis not present

## 2017-12-08 DIAGNOSIS — M21961 Unspecified acquired deformity of right lower leg: Secondary | ICD-10-CM | POA: Diagnosis not present

## 2017-12-08 DIAGNOSIS — B351 Tinea unguium: Secondary | ICD-10-CM | POA: Diagnosis not present

## 2017-12-08 DIAGNOSIS — M79671 Pain in right foot: Secondary | ICD-10-CM | POA: Diagnosis not present

## 2017-12-15 ENCOUNTER — Telehealth: Payer: Self-pay | Admitting: *Deleted

## 2017-12-15 NOTE — Telephone Encounter (Signed)
Received Physician Orders from [facility]; forwarded to provider with OV notes/SLS 07/25 Provider out of office, [place] on Mon, 12/19/17.

## 2017-12-19 NOTE — Telephone Encounter (Signed)
Forms signed and faxed to [employer] at [phone]. Form sent for scanning.

## 2018-01-05 ENCOUNTER — Encounter: Payer: Self-pay | Admitting: Internal Medicine

## 2018-02-07 DIAGNOSIS — M79672 Pain in left foot: Secondary | ICD-10-CM | POA: Diagnosis not present

## 2018-02-07 DIAGNOSIS — E1351 Other specified diabetes mellitus with diabetic peripheral angiopathy without gangrene: Secondary | ICD-10-CM | POA: Diagnosis not present

## 2018-02-07 DIAGNOSIS — B351 Tinea unguium: Secondary | ICD-10-CM | POA: Diagnosis not present

## 2018-02-07 DIAGNOSIS — L84 Corns and callosities: Secondary | ICD-10-CM | POA: Diagnosis not present

## 2018-02-07 DIAGNOSIS — M79671 Pain in right foot: Secondary | ICD-10-CM | POA: Diagnosis not present

## 2018-02-09 ENCOUNTER — Other Ambulatory Visit: Payer: Self-pay | Admitting: Internal Medicine

## 2018-02-09 DIAGNOSIS — L821 Other seborrheic keratosis: Secondary | ICD-10-CM | POA: Diagnosis not present

## 2018-02-09 DIAGNOSIS — L570 Actinic keratosis: Secondary | ICD-10-CM | POA: Diagnosis not present

## 2018-02-09 DIAGNOSIS — D225 Melanocytic nevi of trunk: Secondary | ICD-10-CM | POA: Diagnosis not present

## 2018-02-09 DIAGNOSIS — Z85828 Personal history of other malignant neoplasm of skin: Secondary | ICD-10-CM | POA: Diagnosis not present

## 2018-03-03 ENCOUNTER — Ambulatory Visit: Payer: Medicare Other | Admitting: *Deleted

## 2018-03-06 ENCOUNTER — Other Ambulatory Visit: Payer: Self-pay | Admitting: Internal Medicine

## 2018-03-08 NOTE — Progress Notes (Addendum)
Subjective:   Chase Martinez is a 77 y.o. male who presents for Medicare Annual/Subsequent preventive examination.  Pt plays golf 5x/ week at [facility] where he lives. Walks using electric push cart.  Review of Systems: No ROS.  Medicare Wellness Visit. Additional risk factors are reflected in the social history. Cardiac Risk Factors include: advanced age (>87men, >32 women);diabetes mellitus;dyslipidemia;hypertension;male gender Sleep patterns: no issues Home Safety/Smoke Alarms: Feels safe in home. Smoke alarms in place. Lives in 1 story home.  Eye- every 6 months. Dr. Satira Sark. Utd per pt.  Male:   CCS- No longer doing routine screening due to age.     PSA-  Lab Results  Component Value Date   PSA 1.89 01/06/2016   PSA 1.71 10/30/2013   PSA 2.04 05/03/2011       Objective:    Vitals: BP 126/66 (BP Location: Left Arm, Patient Position: Sitting, Cuff Size: Normal)   Pulse 66   Ht 6' (1.829 m)   Wt 215 lb 3.2 oz (97.6 kg)   SpO2 97%   BMI 29.19 kg/m   Body mass index is 29.19 kg/m.  Advanced Directives 03/09/2018 03/02/2017 08/03/2016 11/28/2015  Does Patient Have a Medical Advance Directive? Yes Yes Yes Yes  Type of Paramedic of [place];Living will [place];Living will [place];Living will [place];Living will  Copy of [place] in Chart? No - copy requested No - copy requested No - copy requested -    Tobacco Social History   Tobacco Use  Smoking Status Former Smoker  . Packs/day: 1.00  . Types: Cigarettes, Cigars  . Last attempt to quit: 05/24/1966  . Years since quitting: 51.8  Smokeless Tobacco Former Systems developer  . Types: Snuff  . Quit date: 05/24/1988     Counseling given: Not Answered   Clinical Intake: Pain : No/denies pain     Past Medical History:  Diagnosis Date  . Basal cell carcinoma    dr Ronnald Ramp  . Diabetes mellitus   . Hyperlipidemia   .  Hypertension   . 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 Premature atrial contractions    Past Surgical History:  Procedure Laterality Date  . CATARACT EXTRACTION Bilateral 2015  . PILONIDAL CYST EXCISION    . REFRACTIVE SURGERY    . SHOULDER SURGERY     right  . TONSILLECTOMY     Family History  Problem Relation Age of Onset  . Hypertension Mother   . Glaucoma Mother   . Dementia Father   . Parkinsonism Sister   . Thyroid cancer Son   . Coronary artery disease Neg Hx   . Diabetes Neg Hx   . Stroke Neg Hx   . Prostate cancer Neg Hx   . Colon cancer Neg Hx   . Esophageal cancer Neg Hx   . Stomach cancer Neg Hx    Social History   Socioeconomic History  . Marital status: Married    Spouse name: Not on file  . Number of children: 2  . Years of education: Not on file  . Highest education level: Not on file  Occupational History  . Occupation: Retired Therapist, art Engineering geologist)  Social Needs  . Financial resource strain: Not on file  . Food insecurity:    Worry: Not on file    Inability: Not on file  . Transportation needs:    Medical: Not on  file    Non-medical: Not on file  Tobacco Use  . Smoking status: Former Smoker    Packs/day: 1.00    Types: Cigarettes, Cigars    Last attempt to quit: 05/24/1966    Years since quitting: 51.8  . Smokeless tobacco: Former Systems developer    Types: Snuff    Quit date: 05/24/1988  Substance and Sexual Activity  . Alcohol use: Yes    Alcohol/week: 7.0 - 14.0 standard drinks    Types: 7 - 14 Glasses of wine per week    Comment: occasional beer and wine  . Drug use: No  . Sexual activity: Yes  Lifestyle  . Physical activity:    Days per week: Not on file    Minutes per session: Not on file  . Stress: Not on file  Relationships  . Social connections:    Talks on phone: Not on file    Gets together: Not on file    Attends religious service: Not on file    Active member of club or  organization: Not on file    Attends meetings of clubs or organizations: Not on file    Relationship status: Not on file  Other Topics Concern  . Not on file  Social History Narrative   Household-- pt and wife    Son lives near by, daughter near [place]    Outpatient Encounter Medications as of 03/09/2018  Medication Sig  . aspirin 81 MG tablet Take 81 mg by mouth daily.    Marland Kitchen atorvastatin (LIPITOR) 20 MG tablet Take 1 tablet (20 mg total) by mouth daily.  Marland Kitchen diltiazem (CARDIZEM LA) 240 MG 24 hr tablet TAKE 1 TABLET DAILY  . furosemide (LASIX) 20 MG tablet Take 1 tablet (20 mg total) by mouth daily.  Marland Kitchen glimepiride (AMARYL) 2 MG tablet Take 1 tablet (2 mg total) by mouth daily with breakfast.  . Lancets (FREESTYLE) lancets USE AS DIRECTED TO CHECK BLOOD SUGARS 3 TO 4 TIMES DAILY  . metFORMIN (GLUCOPHAGE) 1000 MG tablet Take 1 tablet (1,000 mg total) by mouth 2 (two) times daily with a meal.  . metoprolol succinate (TOPROL XL) 100 MG 24 hr tablet Take 1 tablet (100 mg total) by mouth daily. Take with or immediately following a meal.  . multivitamin (THERAGRAN) per tablet Take 1 tablet by mouth daily.    . Omega-3 Fatty Acids (FISH OIL) 1000 MG CAPS Take 1 capsule by mouth daily.   . sitaGLIPtin (JANUVIA) 100 MG tablet Take 1 tablet (100 mg total) by mouth daily.  . tamsulosin (FLOMAX) 0.4 MG CAPS capsule Take 1 capsule (0.4 mg total) by mouth daily after supper.  Marland Kitchen FREESTYLE LITE test strip USE TO CHECK BLOOD SUGAR NOT MORE THAN TWICE A DAY   No facility-administered encounter medications on file as of 03/09/2018.     Activities of Daily Living In your present state of health, do you have any difficulty performing the following activities: 03/09/2018  Hearing? N  Vision? N  Difficulty concentrating or making decisions? N  Walking or climbing stairs? N  Dressing or bathing? N  Doing errands, shopping? N  Preparing Food and eating ? N  Using the Toilet? N  In the past six  months, have you accidently leaked urine? N  Do you have problems with loss of bowel control? N  Managing your Medications? N  Managing your Finances? N  Housekeeping or managing your Housekeeping? N  Some recent data might be hidden  Patient Care Team: Colon Branch, MD as PCP - General Francee Piccolo, MD as Consulting Physician (Podiatry) Sueanne Margarita, MD as Consulting Physician (Cardiology)   Assessment:   This is a routine wellness examination for [place]. Physical assessment deferred to PCP.  Exercise Activities and Dietary recommendations Current Exercise Habits: Home exercise routine, Type of exercise: walking, Frequency (Times/Week): 5, Intensity: Mild, Exercise limited by: None identified Diet (meal preparation, eat out, water intake, caffeinated beverages, dairy products, fruits and vegetables): well balanced, on average, 3 meals per day   Goals    . Maintain current health. (pt-stated)       Fall Risk Fall Risk  03/09/2018 03/02/2017 04/06/2016 01/06/2016 07/03/2015  Falls in the past year? No No No No No     Depression Screen PHQ 2/9 Scores 03/09/2018 03/02/2017 04/06/2016 01/06/2016  PHQ - 2 Score 0 0 0 0    Cognitive Function Ad8 score reviewed for issues:  Issues making decisions:no  Less interest in hobbies / activities:no  Repeats questions, stories (family complaining):no  Trouble using ordinary gadgets (microwave, computer, phone):no  Forgets the month or year: no  Mismanaging finances: no  Remembering appts:no  Daily problems with thinking and/or memory:no Ad8 score is=0        Immunization History  Administered Date(s) Administered  . DTaP 06/21/2011  . H1N1 06/11/2008  . Hepatitis A 12/23/2015  . Influenza Split 02/22/2012  . Influenza Whole 04/05/2007, 02/13/2008, 02/18/2009, 01/20/2010  . Influenza, High Dose Seasonal PF 04/04/2013, 03/02/2017  . Influenza,inj,Quad PF,6+ Mos 03/07/2014  . Influenza-Unspecified 03/06/2015,  02/20/2016  . Pneumococcal Conjugate-13 10/30/2013  . Pneumococcal Polysaccharide-23 05/24/2002, 09/28/2007, 08/05/2016  . Td 05/24/2001, 05/21/2013  . Typhoid Inactivated 12/23/2015  . Yellow Fever 12/23/2015  . Zoster 05/24/2002    Screening Tests Health Maintenance  Topic Date Due  . INFLUENZA VACCINE  12/22/2017  . FOOT EXAM  03/02/2018  . URINE MICROALBUMIN  03/02/2018  . HEMOGLOBIN A1C  03/10/2018  . OPHTHALMOLOGY EXAM  12/07/2018  . TETANUS/TDAP  05/22/2023  . PNA vac Low Risk Adult  Completed       Plan:    Please schedule your next medicare wellness visit with me in 1 yr.  Continue to eat heart healthy diet (full of fruits, vegetables, whole grains, lean protein, water--limit salt, fat, and sugar intake) and increase physical activity as tolerated.  Bring a copy of your living will and/or healthcare power of attorney to your next office visit.  You received flu shot today.  I have personally reviewed and noted the following in the patient's chart:   . Medical and social history . Use of alcohol, tobacco or illicit drugs  . Current medications and supplements . Functional ability and status . Nutritional status . Physical activity . Advanced directives . List of other physicians . Hospitalizations, surgeries, and ER visits in previous 12 months . Vitals . Screenings to include cognitive, depression, and falls . Referrals and appointments  In addition, I have reviewed and discussed with patient certain preventive protocols, quality metrics, and best practice recommendations. A written personalized care plan for preventive services as well as general preventive health recommendations were provided to patient.     Naaman Plummer [place], [place]  03/09/2018 Kathlene November, MD

## 2018-03-09 ENCOUNTER — Encounter: Payer: Self-pay | Admitting: *Deleted

## 2018-03-09 ENCOUNTER — Encounter: Payer: Self-pay | Admitting: Internal Medicine

## 2018-03-09 ENCOUNTER — Ambulatory Visit (INDEPENDENT_AMBULATORY_CARE_PROVIDER_SITE_OTHER): Payer: Medicare Other | Admitting: Internal Medicine

## 2018-03-09 ENCOUNTER — Ambulatory Visit (INDEPENDENT_AMBULATORY_CARE_PROVIDER_SITE_OTHER): Payer: Medicare Other | Admitting: *Deleted

## 2018-03-09 VITALS — BP 126/66 | HR 66 | Ht 72.0 in | Wt 215.2 lb

## 2018-03-09 DIAGNOSIS — Z23 Encounter for immunization: Secondary | ICD-10-CM

## 2018-03-09 DIAGNOSIS — E785 Hyperlipidemia, unspecified: Secondary | ICD-10-CM

## 2018-03-09 DIAGNOSIS — E1140 Type 2 diabetes mellitus with diabetic neuropathy, unspecified: Secondary | ICD-10-CM

## 2018-03-09 DIAGNOSIS — Z0000 Encounter for general adult medical examination without abnormal findings: Secondary | ICD-10-CM

## 2018-03-09 DIAGNOSIS — I10 Essential (primary) hypertension: Secondary | ICD-10-CM

## 2018-03-09 DIAGNOSIS — N400 Enlarged prostate without lower urinary tract symptoms: Secondary | ICD-10-CM

## 2018-03-09 LAB — BASIC METABOLIC PANEL
BUN: 15 mg/dL (ref 6–23)
CALCIUM: 9.5 mg/dL (ref 8.4–10.5)
CO2: 26 mEq/L (ref 19–32)
CREATININE: 0.97 mg/dL (ref 0.40–1.50)
Chloride: 101 mEq/L (ref 96–112)
GFR: 79.27 mL/min (ref >60.00)
Glucose, Bld: 212 mg/dL — ABNORMAL HIGH (ref 70–99)
Potassium: 4.3 mEq/L (ref 3.5–5.1)
SODIUM: 136 meq/L (ref 135–145)

## 2018-03-09 LAB — CBC WITH DIFFERENTIAL/PLATELET
BASOS ABS: 0.1 10*3/uL (ref 0.0–0.1)
Basophils Relative: 0.8 % (ref 0.0–3.0)
EOS ABS: 0.4 10*3/uL (ref 0.0–0.7)
EOS PCT: 5.5 % — AB (ref 0.0–5.0)
HCT: 37.4 % — ABNORMAL LOW (ref 39.0–52.0)
HEMOGLOBIN: 12.8 g/dL — AB (ref 13.0–17.0)
LYMPHS ABS: 1.5 10*3/uL (ref 0.7–4.0)
Lymphocytes Relative: 21.8 % (ref 12.0–46.0)
MCHC: 34.3 g/dL (ref 30.0–36.0)
MCV: 93.9 fl (ref 78.0–100.0)
MONO ABS: 0.5 10*3/uL (ref 0.1–1.0)
Monocytes Relative: 7.5 % (ref 3.0–12.0)
NEUTROS PCT: 64.4 % (ref 43.0–77.0)
Neutro Abs: 4.5 10*3/uL (ref 1.4–7.7)
Platelets: 277 10*3/uL (ref 150.0–400.0)
RBC: 3.99 Mil/uL — AB (ref 4.22–5.81)
RDW: 13.4 % (ref 11.5–15.5)
WBC: 6.9 10*3/uL (ref 4.0–10.5)

## 2018-03-09 LAB — HEMOGLOBIN A1C: HEMOGLOBIN A1C: 6.5 % (ref 4.6–6.5)

## 2018-03-09 LAB — AST: AST: 21 U/L (ref 0–37)

## 2018-03-09 LAB — PSA: PSA: 1.71 ng/mL (ref 0.10–4.00)

## 2018-03-09 LAB — ALT: ALT: 25 U/L (ref 0–53)

## 2018-03-09 MED ORDER — ZOSTER VAC RECOMB ADJUVANTED 50 MCG/0.5ML IM SUSR: 0.5000 mL | Freq: Once | INTRAMUSCULAR | 1 refills | Status: AC

## 2018-03-09 NOTE — Assessment & Plan Note (Signed)
Had a flu shot today Shingrix discussed, prescription printed. Prostate cancer screening: Pros and cons discussed, he likes to proceed with a PSA.  Declining a DRE.

## 2018-03-09 NOTE — Progress Notes (Signed)
Pre visit review using our clinic review tool, if applicable. No additional management support is needed unless otherwise documented below in the visit note. 

## 2018-03-09 NOTE — Patient Instructions (Signed)
GO TO THE LAB : Get the blood work     GO TO THE FRONT DESK Schedule your next appointment for a  Check up in 6 months   

## 2018-03-09 NOTE — Patient Instructions (Signed)
Please schedule your next medicare wellness visit with me in 1 yr.  Continue to eat heart healthy diet (full of fruits, vegetables, whole grains, lean protein, water--limit salt, fat, and sugar intake) and increase physical activity as tolerated.  Bring a copy of your living will and/or healthcare power of attorney to your next office visit.  You received flu shot today.   Chase Martinez , Thank you for taking time to come for your Medicare Wellness Visit. I appreciate your ongoing commitment to your health goals. Please review the following plan we discussed and let me know if I can assist you in the future.   These are the goals we discussed: Goals    . Maintain current health. (pt-stated)       This is a list of the screening recommended for you and due dates:  Health Maintenance  Topic Date Due  . Flu Shot  12/22/2017  . Complete foot exam   03/02/2018  . Urine Protein Check  03/02/2018  . Hemoglobin A1C  03/10/2018  . Eye exam for diabetics  12/07/2018  . Tetanus Vaccine  05/22/2023  . Pneumonia vaccines  Completed    Health Maintenance, Male A healthy lifestyle and preventive care is important for your health and wellness. Ask your health care provider about what schedule of regular examinations is right for you. What should I know about weight and diet? Eat a Healthy Diet  Eat plenty of vegetables, fruits, whole grains, low-fat dairy products, and lean protein.  Do not eat a lot of foods high in solid fats, added sugars, or salt.  Maintain a Healthy Weight Regular exercise can help you achieve or maintain a healthy weight. You should:  Do at least 150 minutes of exercise each week. The exercise should increase your heart rate and make you sweat (moderate-intensity exercise).  Do strength-training exercises at least twice a week.  Watch Your Levels of Cholesterol and Blood Lipids  Have your blood tested for lipids and cholesterol every 5 years starting at 79 years  of age. If you are at high risk for heart disease, you should start having your blood tested when you are 79 years old. You may need to have your cholesterol levels checked more often if: ? Your lipid or cholesterol levels are high. ? You are older than 79 years of age. ? You are at high risk for heart disease.  What should I know about cancer screening? Many types of cancers can be detected early and may often be prevented. Lung Cancer  You should be screened every year for lung cancer if: ? You are a current smoker who has smoked for at least 30 years. ? You are a former smoker who has quit within the past 15 years.  Talk to your health care provider about your screening options, when you should start screening, and how often you should be screened.  Colorectal Cancer  Routine colorectal cancer screening usually begins at 79 years of age and should be repeated every 5-10 years until you are 79 years old. You may need to be screened more often if early forms of precancerous polyps or small growths are found. Your health care provider may recommend screening at an earlier age if you have risk factors for colon cancer.  Your health care provider may recommend using home test kits to check for hidden blood in the stool.  A small camera at the end of a tube can be used to examine your colon (sigmoidoscopy  or colonoscopy). This checks for the earliest forms of colorectal cancer.  Prostate and Testicular Cancer  Depending on your age and overall health, your health care provider may do certain tests to screen for prostate and testicular cancer.  Talk to your health care provider about any symptoms or concerns you have about testicular or prostate cancer.  Skin Cancer  Check your skin from head to toe regularly.  Tell your health care provider about any new moles or changes in moles, especially if: ? There is a change in a mole's size, shape, or color. ? You have a mole that is larger  than a pencil eraser.  Always use sunscreen. Apply sunscreen liberally and repeat throughout the day.  Protect yourself by wearing long sleeves, pants, a wide-brimmed hat, and sunglasses when outside.  What should I know about heart disease, diabetes, and high blood pressure?  If you are 65-35 years of age, have your blood pressure checked every 3-5 years. If you are 73 years of age or older, have your blood pressure checked every year. You should have your blood pressure measured twice-once when you are at a hospital or clinic, and once when you are not at a hospital or clinic. Record the average of the two measurements. To check your blood pressure when you are not at a hospital or clinic, you can use: ? An automated blood pressure machine at a pharmacy. ? A home blood pressure monitor.  Talk to your health care provider about your target blood pressure.  If you are between 68-47 years old, ask your health care provider if you should take aspirin to prevent heart disease.  Have regular diabetes screenings by checking your fasting blood sugar level. ? If you are at a normal weight and have a low risk for diabetes, have this test once every three years after the age of 24. ? If you are overweight and have a high risk for diabetes, consider being tested at a younger age or more often.  A one-time screening for abdominal aortic aneurysm (AAA) by ultrasound is recommended for men aged 20-75 years who are current or former smokers. What should I know about preventing infection? Hepatitis B If you have a higher risk for hepatitis B, you should be screened for this virus. Talk with your health care provider to find out if you are at risk for hepatitis B infection. Hepatitis C Blood testing is recommended for:  Everyone born from [birth year] through 1967.  Anyone with known risk factors for hepatitis C.  Sexually Transmitted Diseases (STDs)  You should be screened each year for STDs including  gonorrhea and chlamydia if: ? You are sexually active and are younger than 79 years of age. ? You are older than 79 years of age and your health care provider tells you that you are at risk for this type of infection. ? Your sexual activity has changed since you were last screened and you are at an increased risk for chlamydia or gonorrhea. Ask your health care provider if you are at risk.  Talk with your health care provider about whether you are at high risk of being infected with HIV. Your health care provider may recommend a prescription medicine to help prevent HIV infection.  What else can I do?  Schedule regular health, dental, and eye exams.  Stay current with your vaccines (immunizations).  Do not use any tobacco products, such as cigarettes, chewing tobacco, and e-cigarettes. If you need help quitting, ask your  health care provider.  Limit alcohol intake to no more than 2 drinks per day. One drink equals 12 ounces of beer, 5 ounces of wine, or 1 ounces of hard liquor.  Do not use street drugs.  Do not share needles.  Ask your health care provider for help if you need support or information about quitting drugs.  Tell your health care provider if you often feel depressed.  Tell your health care provider if you have ever been abused or do not feel safe at home. This information is not intended to replace advice given to you by your health care provider. Make sure you discuss any questions you have with your health care provider. Document Released: 11/06/2007 Document Revised: 01/07/2016 Document Reviewed: 02/11/2015 Elsevier Interactive Patient Education  [employer].

## 2018-03-09 NOTE — Progress Notes (Signed)
Subjective:    Patient ID: Chase Martinez, male    DOB: [birth year], 77 y.o.   MRN: [id number]  DOS:  03/09/2018 Type of visit - description : f/u Interval history: DM: Good med compliance, blood sugars average 125. HTN: Good med compliance, ambulatory BPs in the 120s / 60s. BPH: Essentially asymptomatic. Asbestosis: Note from pulmonary reviewed.   Review of Systems  Denies chest pain no difficulty breathing No dysuria, gross hematuria difficulty urinating.  Past Medical History:  Diagnosis Date  . Basal cell carcinoma    dr Ronnald Ramp  . Diabetes mellitus   . Hyperlipidemia   . Hypertension   . 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 Premature atrial contractions     Past Surgical History:  Procedure Laterality Date  . CATARACT EXTRACTION Bilateral 2015  . PILONIDAL CYST EXCISION    . REFRACTIVE SURGERY    . SHOULDER SURGERY     right  . TONSILLECTOMY      Social History   Socioeconomic History  . Marital status: Married    Spouse name: Not on file  . Number of children: 2  . Years of education: Not on file  . Highest education level: Not on file  Occupational History  . Occupation: Retired Therapist, art Engineering geologist)  Social Needs  . Financial resource strain: Not on file  . Food insecurity:    Worry: Not on file    Inability: Not on file  . Transportation needs:    Medical: Not on file    Non-medical: Not on file  Tobacco Use  . Smoking status: Former Smoker    Packs/day: 1.00    Types: Cigarettes, Cigars    Last attempt to quit: 05/24/1966    Years since quitting: 51.8  . Smokeless tobacco: Former Systems developer    Types: Snuff    Quit date: 05/24/1988  Substance and Sexual Activity  . Alcohol use: Yes    Alcohol/week: 7.0 - 14.0 standard drinks    Types: 7 - 14 Glasses of wine per week    Comment: occasional beer and wine  . Drug use: No  . Sexual activity: Yes  Lifestyle  . Physical activity:    Days per week:  Not on file    Minutes per session: Not on file  . Stress: Not on file  Relationships  . Social connections:    Talks on phone: Not on file    Gets together: Not on file    Attends religious service: Not on file    Active member of club or organization: Not on file    Attends meetings of clubs or organizations: Not on file    Relationship status: Not on file  . Intimate partner violence:    Fear of current or ex partner: Not on file    Emotionally abused: Not on file    Physically abused: Not on file    Forced sexual activity: Not on file  Other Topics Concern  . Not on file  Social History Narrative   Household-- pt and wife    Son lives near by, daughter near [place] as of 03/09/2018   No Known Allergies     Medication List        Accurate as of 03/09/18 11:59 PM. Always use your most recent med list.          aspirin 81 MG tablet Take 81 mg by mouth  daily.   atorvastatin 20 MG tablet Commonly known as:  LIPITOR Take 1 tablet (20 mg total) by mouth daily.   diltiazem 240 MG 24 hr tablet Commonly known as:  CARDIZEM LA TAKE 1 TABLET DAILY   Fish Oil 1000 MG Caps Take 1 capsule by mouth daily.   freestyle lancets USE AS DIRECTED TO CHECK BLOOD SUGARS 3 TO 4 TIMES DAILY   FREESTYLE LITE test strip Generic drug:  glucose blood USE TO CHECK BLOOD SUGAR NOT MORE THAN TWICE A DAY   furosemide 20 MG tablet Commonly known as:  LASIX Take 1 tablet (20 mg total) by mouth daily.   glimepiride 2 MG tablet Commonly known as:  AMARYL Take 1 tablet (2 mg total) by mouth daily with breakfast.   metFORMIN 1000 MG tablet Commonly known as:  GLUCOPHAGE Take 1 tablet (1,000 mg total) by mouth 2 (two) times daily with a meal.   metoprolol succinate 100 MG 24 hr tablet Commonly known as:  TOPROL-XL Take 1 tablet (100 mg total) by mouth daily. Take with or immediately following a meal.   multivitamin per tablet Take 1 tablet by mouth daily.     sitaGLIPtin 100 MG tablet Commonly known as:  JANUVIA Take 1 tablet (100 mg total) by mouth daily.   tamsulosin 0.4 MG Caps capsule Commonly known as:  FLOMAX Take 1 capsule (0.4 mg total) by mouth daily after supper.   Zoster Vaccine Adjuvanted injection Commonly known as:  SHINGRIX Inject 0.5 mLs into the muscle once for 1 dose.          Objective:   Physical Exam BP 126/66 (BP Location: Left Arm, Patient Position: Sitting, Cuff Size: Normal)   Pulse 66   Ht 6' (1.829 m)   Wt 215 lb 3 oz (97.6 kg)   SpO2 97%   BMI 29.18 kg/m  General:   Well developed, NAD, see BMI.  HEENT:  Normocephalic . Face symmetric, atraumatic Neck: No thyromegaly Lungs:  CTA B Normal respiratory effort, no intercostal retractions, no accessory muscle use. Heart: RRR,  no murmur.  no pretibial edema bilaterally  Abdomen:  Not distended, soft, non-tender. No rebound or rigidity.  No bruit Skin: Not pale. Not jaundice Neurologic:  alert & oriented X3.  Speech normal, gait appropriate for age and unassisted Psych--  Cognition and judgment appear intact.  Cooperative with normal attention span and concentration.  Behavior appropriate. No anxious or depressed appearing.     Assessment & Plan:    Assessment: DM--  + neuropathy HTN Hyperlipidemia PVCs  -- DR Thomasena Edis 03-2015, on CCB,BB BPH BCC Dr. Ronnald Ramp Nail dystrophy  Asbestosis dx 2019  PLAN: Medicare annual wellness today DM: Seems to be under excellent control based on CBGs, continue glimepiride, metformin, Januvia.  Check a A1c HTN: Seems controlled on Cardizem, Lasix, Toprol.  Check BMP and CBC. High cholesterol: Controlled on Lipitor, check LFTs BPH: Asymptomatic, see comments under "annual exam". Asbestosis: Recently diagnosed, saw pulmonary, they plan to see him yearly. Preventive care reviewed RTC 6 to 8 months.

## 2018-03-10 NOTE — Assessment & Plan Note (Signed)
Medicare annual wellness today DM: Seems to be under excellent control based on CBGs, continue glimepiride, metformin, Januvia.  Check a A1c HTN: Seems controlled on Cardizem, Lasix, Toprol.  Check BMP and CBC. High cholesterol: Controlled on Lipitor, check LFTs BPH: Asymptomatic, see comments under "annual exam". Asbestosis: Recently diagnosed, saw pulmonary, they plan to see him yearly. Preventive care reviewed RTC 6 to 8 months.

## 2018-03-11 ENCOUNTER — Other Ambulatory Visit: Payer: Self-pay | Admitting: Internal Medicine

## 2018-04-13 DIAGNOSIS — L84 Corns and callosities: Secondary | ICD-10-CM | POA: Diagnosis not present

## 2018-04-13 DIAGNOSIS — M79671 Pain in right foot: Secondary | ICD-10-CM | POA: Diagnosis not present

## 2018-04-13 DIAGNOSIS — M79672 Pain in left foot: Secondary | ICD-10-CM | POA: Diagnosis not present

## 2018-04-13 DIAGNOSIS — E1351 Other specified diabetes mellitus with diabetic peripheral angiopathy without gangrene: Secondary | ICD-10-CM | POA: Diagnosis not present

## 2018-04-13 DIAGNOSIS — B351 Tinea unguium: Secondary | ICD-10-CM | POA: Diagnosis not present

## 2018-04-17 ENCOUNTER — Other Ambulatory Visit: Payer: Self-pay | Admitting: Internal Medicine

## 2018-04-22 ENCOUNTER — Other Ambulatory Visit: Payer: Self-pay | Admitting: Internal Medicine

## 2018-05-04 IMAGING — DX DG CHEST 2V
2 series · 2 of 2 positions shown · non-contrast
Comparison: Chest radiographs 07/10/2009.

CLINICAL DATA: 75-year-old male with asbestos exposure. Initial
encounter.

EXAM:
CHEST  2 VIEW

[chest pa]
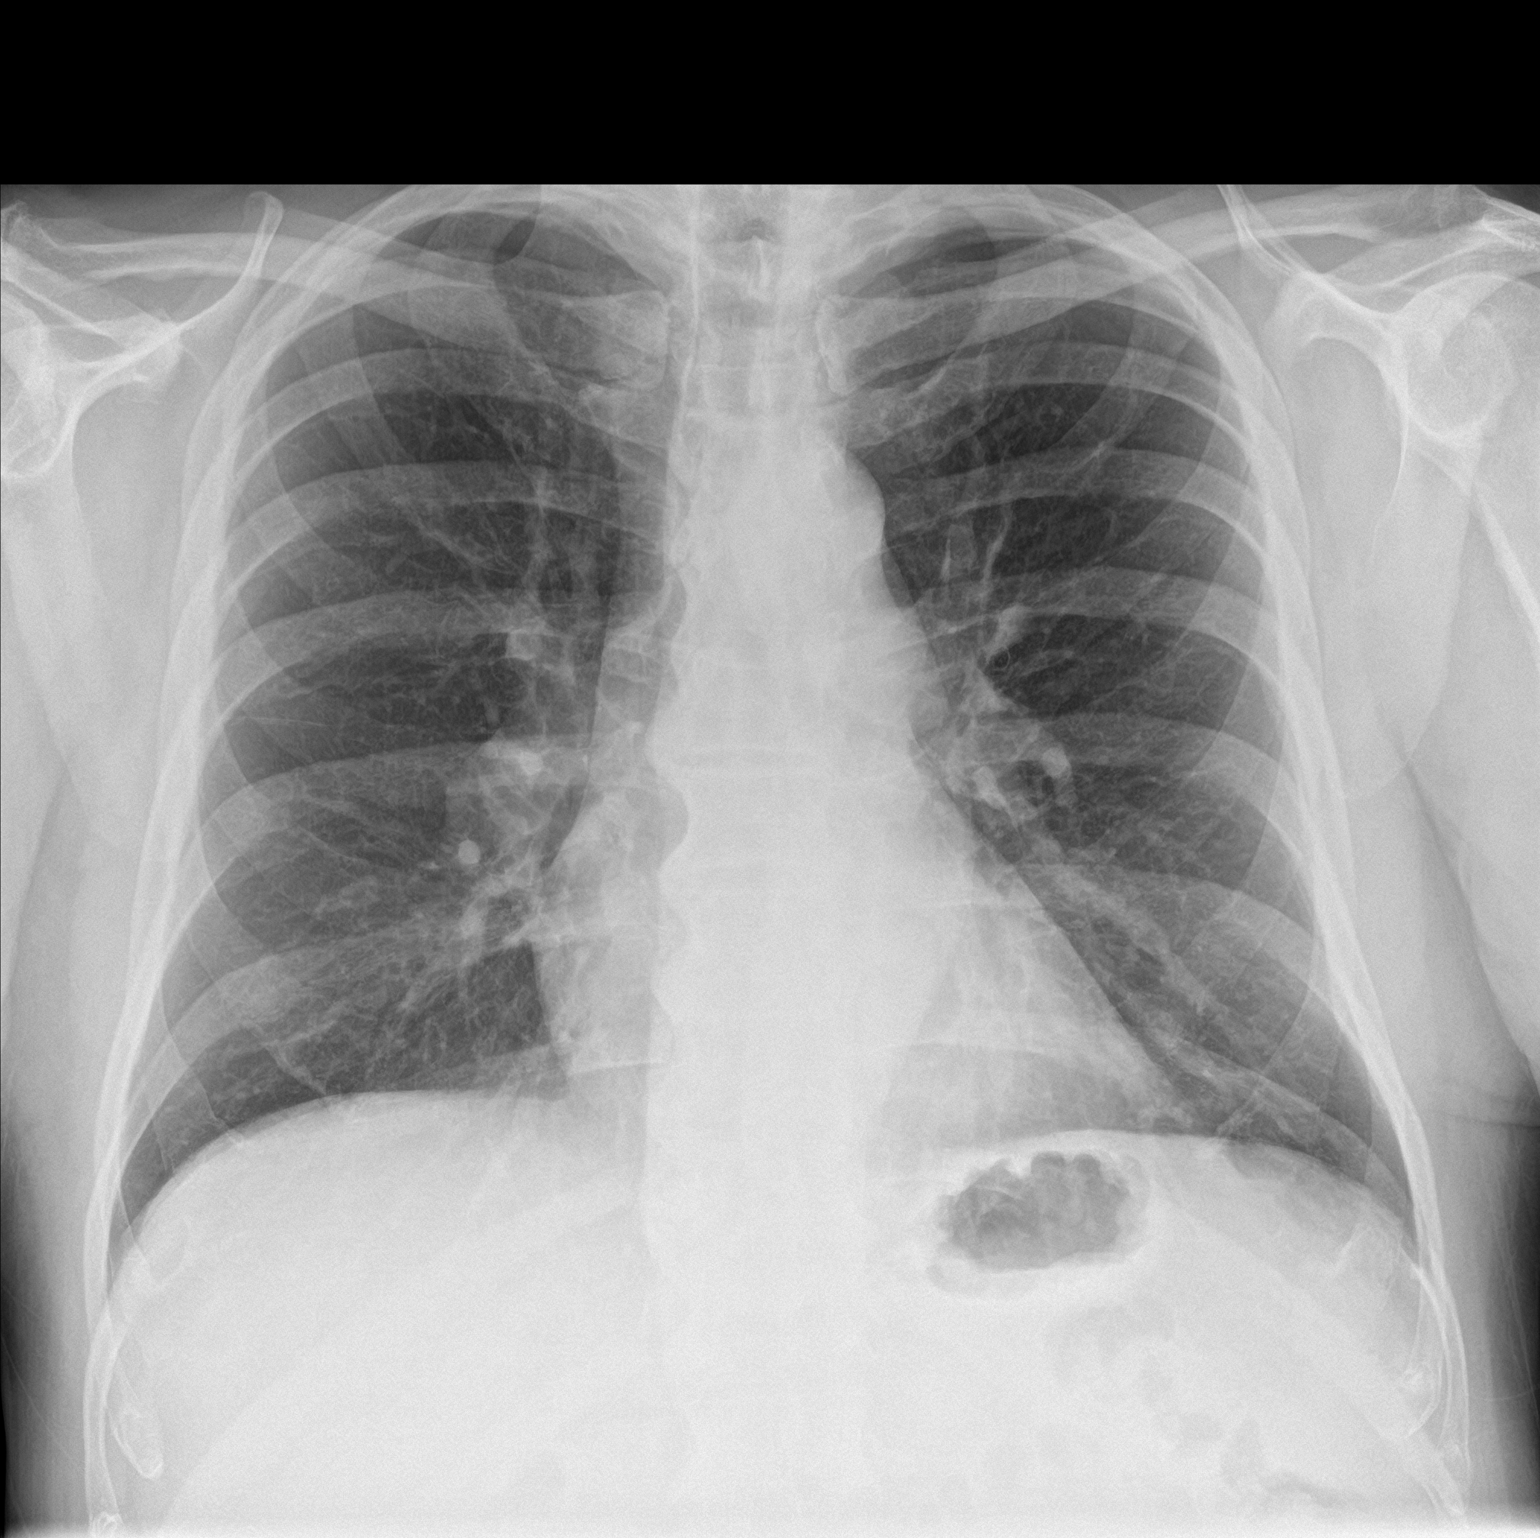

[chest lat]
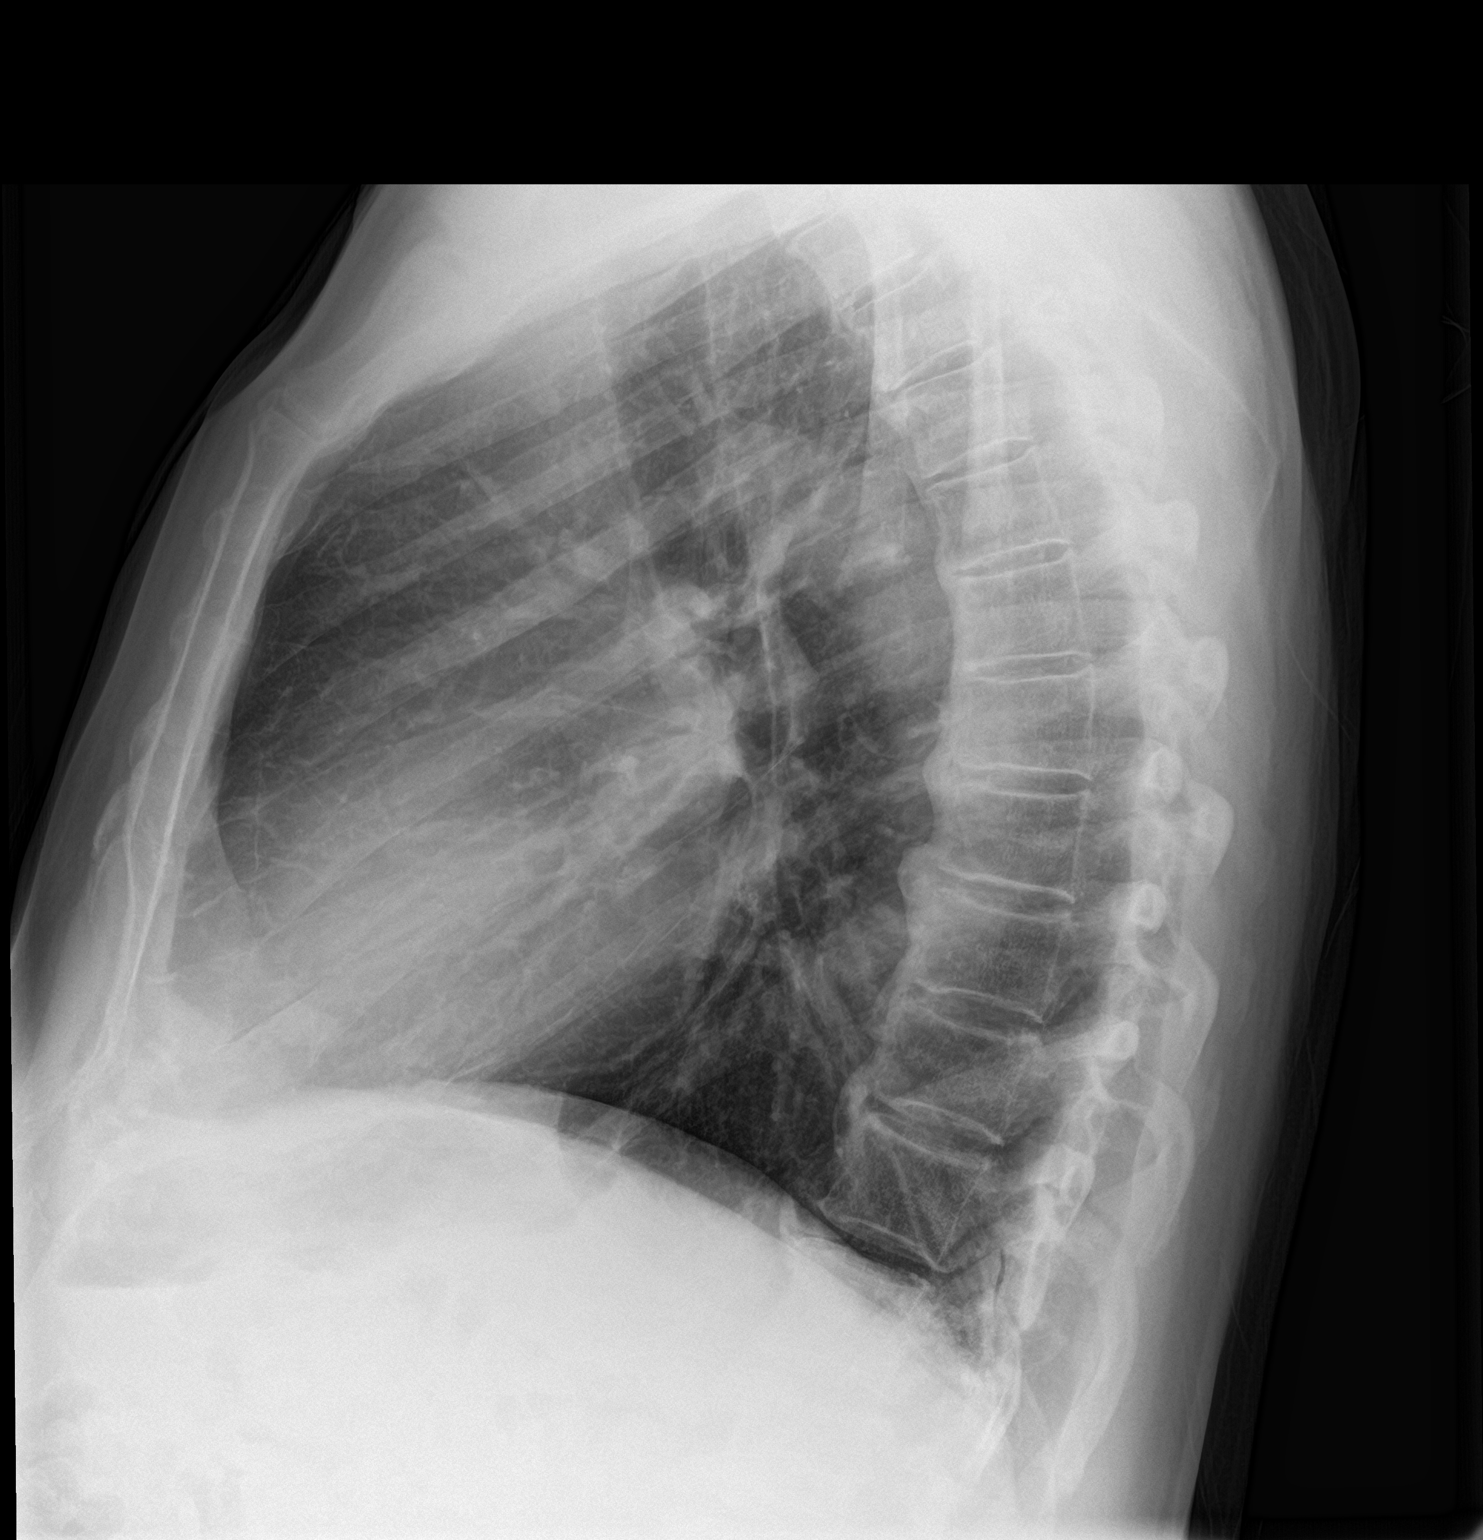

[2 of 2 positions shown; findings below may reference images not displayed]

FINDINGS: Lung volumes remain normal. Normal cardiac size and mediastinal
contours. Visualized tracheal air column is within normal limits.
The possible 10 mm right lower lung nodule in 1611 does not persist.
No pneumothorax, pulmonary edema, pleural effusion or confluent
pulmonary opacity. Flowing osteophytes throughout the thoracic
spine. No acute osseous abnormality identified.
IMPRESSION: Negative for age.  No acute cardiopulmonary abnormality.

## 2018-05-13 ENCOUNTER — Other Ambulatory Visit: Payer: Self-pay | Admitting: Internal Medicine

## 2018-06-03 ENCOUNTER — Other Ambulatory Visit: Payer: Self-pay | Admitting: Internal Medicine

## 2018-06-13 ENCOUNTER — Encounter: Payer: Self-pay | Admitting: Cardiology

## 2018-06-13 DIAGNOSIS — H401231 Low-tension glaucoma, bilateral, mild stage: Secondary | ICD-10-CM | POA: Diagnosis not present

## 2018-06-22 ENCOUNTER — Other Ambulatory Visit: Payer: Self-pay | Admitting: Internal Medicine

## 2018-06-29 DIAGNOSIS — B351 Tinea unguium: Secondary | ICD-10-CM | POA: Diagnosis not present

## 2018-06-29 DIAGNOSIS — M79672 Pain in left foot: Secondary | ICD-10-CM | POA: Diagnosis not present

## 2018-06-29 DIAGNOSIS — E1351 Other specified diabetes mellitus with diabetic peripheral angiopathy without gangrene: Secondary | ICD-10-CM | POA: Diagnosis not present

## 2018-06-29 DIAGNOSIS — M79671 Pain in right foot: Secondary | ICD-10-CM | POA: Diagnosis not present

## 2018-06-29 DIAGNOSIS — L84 Corns and callosities: Secondary | ICD-10-CM | POA: Diagnosis not present

## 2018-06-29 LAB — HM DIABETES FOOT EXAM

## 2018-06-30 ENCOUNTER — Encounter: Payer: Self-pay | Admitting: Cardiology

## 2018-06-30 ENCOUNTER — Ambulatory Visit (INDEPENDENT_AMBULATORY_CARE_PROVIDER_SITE_OTHER): Payer: Medicare Other | Admitting: Cardiology

## 2018-06-30 VITALS — BP 128/72 | HR 59 | Ht 72.0 in | Wt 220.2 lb

## 2018-06-30 DIAGNOSIS — I10 Essential (primary) hypertension: Secondary | ICD-10-CM

## 2018-06-30 DIAGNOSIS — I491 Atrial premature depolarization: Secondary | ICD-10-CM | POA: Diagnosis not present

## 2018-06-30 DIAGNOSIS — E7800 Pure hypercholesterolemia, unspecified: Secondary | ICD-10-CM

## 2018-06-30 NOTE — Patient Instructions (Signed)
Medication Instructions:  Your physician recommends that you continue on your current medications as directed. Please refer to the Current Medication list given to you today.  If you need a refill on your cardiac medications before your next appointment, please call your pharmacy.   Lab work: None If you have labs (blood work) drawn today and your tests are completely normal, you will receive your results only by: . MyChart Message (if you have MyChart) OR . A paper copy in the mail If you have any lab test that is abnormal or we need to change your treatment, we will call you to review the results.  Testing/Procedures: None  Follow-Up: At [facility], you and your health needs are our priority.  As part of our continuing mission to provide you with exceptional heart care, we have created designated Provider Care Teams.  These Care Teams include your primary Cardiologist (physician) and Advanced Practice Providers (APPs -  Physician Assistants and Nurse Practitioners) who all work together to provide you with the care you need, when you need it. You will need a follow up appointment in 1 years.  Please call our office 2 months in advance to schedule this appointment.  You may see Traci Turner, MD or one of the following Advanced Practice Providers on your designated Care Team:   Brittainy Simmons, PA-C Dayna Dunn, PA-C . Michele Lenze, PA-C   

## 2018-06-30 NOTE — Progress Notes (Signed)
Cardiology Office Note:    Date:  06/30/2018   ID:  Chase Martinez, DOB 1940/11/11, MRN [id number]  PCP:  Colon Branch, MD  Cardiologist:  Fransico Him, MD    Referring MD: Colon Branch, MD   Chief Complaint  Patient presents with  . Hypertension    History of Present Illness:    Chase Martinez is a 78 y.o. male with a hx of PAC's and HTN.  He is here today for followup and is doing well.  He denies any chest pain or pressure, SOB, DOE, PND, orthopnea, LE edema, dizziness, palpitations or syncope. He is compliant with his meds and is tolerating meds with no SE.    Past Medical History:  Diagnosis Date  . Basal cell carcinoma    dr Ronnald Ramp  . Diabetes mellitus   . Hyperlipidemia   . Hypertension   . 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 Premature atrial contractions     Past Surgical History:  Procedure Laterality Date  . CATARACT EXTRACTION Bilateral 2015  . PILONIDAL CYST EXCISION    . REFRACTIVE SURGERY    . SHOULDER SURGERY     right  . TONSILLECTOMY      Current Medications: Current Meds  Medication Sig  . aspirin 81 MG tablet Take 81 mg by mouth daily.    Marland Kitchen atorvastatin (LIPITOR) 20 MG tablet Take 1 tablet (20 mg total) by mouth daily.  Marland Kitchen diltiazem (CARDIZEM LA) 240 MG 24 hr tablet Take 240 mg by mouth daily.  Marland Kitchen FREESTYLE LITE test strip USE TO CHECK BLOOD SUGAR NOT MORE THAN TWICE A DAY  . furosemide (LASIX) 20 MG tablet Take 1 tablet (20 mg total) by mouth daily.  Marland Kitchen glimepiride (AMARYL) 2 MG tablet Take 1 tablet (2 mg total) by mouth daily with breakfast.  . Lancets (FREESTYLE) lancets USE AS DIRECTED TO CHECK BLOOD SUGARS 3 TO 4 TIMES DAILY  . metFORMIN (GLUCOPHAGE) 1000 MG tablet Take 1 tablet (1,000 mg total) by mouth 2 (two) times daily with a meal.  . metoprolol succinate (TOPROL XL) 100 MG 24 hr tablet Take 1 tablet (100 mg total) by mouth daily. Take with or immediately following a meal.  .  multivitamin (THERAGRAN) per tablet Take 1 tablet by mouth daily.    . Omega-3 Fatty Acids (FISH OIL) 1000 MG CAPS Take 1 capsule by mouth daily.   . sitaGLIPtin (JANUVIA) 100 MG tablet Take 1 tablet (100 mg total) by mouth daily.  . tamsulosin (FLOMAX) 0.4 MG CAPS capsule Take 1 capsule (0.4 mg total) by mouth daily after supper.     Allergies:   Patient has no known allergies.   Social History   Socioeconomic History  . Marital status: Married    Spouse name: Not on file  . Number of children: 2  . Years of education: Not on file  . Highest education level: Not on file  Occupational History  . Occupation: Retired Therapist, art Engineering geologist)  Social Needs  . Financial resource strain: Not on file  . Food insecurity:    Worry: Not on file    Inability: Not on file  . Transportation needs:    Medical: Not on file    Non-medical: Not on file  Tobacco Use  . Smoking status: Former Smoker    Packs/day: 1.00    Types: Cigarettes, Cigars    Last attempt to quit: 05/24/1966    Years since  quitting: 52.1  . Smokeless tobacco: Former Systems developer    Types: Snuff    Quit date: 05/24/1988  Substance and Sexual Activity  . Alcohol use: Yes    Alcohol/week: 7.0 - 14.0 standard drinks    Types: 7 - 14 Glasses of wine per week    Comment: occasional beer and wine  . Drug use: No  . Sexual activity: Yes  Lifestyle  . Physical activity:    Days per week: Not on file    Minutes per session: Not on file  . Stress: Not on file  Relationships  . Social connections:    Talks on phone: Not on file    Gets together: Not on file    Attends religious service: Not on file    Active member of club or organization: Not on file    Attends meetings of clubs or organizations: Not on file    Relationship status: Not on file  Other Topics Concern  . Not on file  Social History Narrative   Household-- pt and wife    Son lives near by, daughter near [place]     Family History: The patient's family history  includes Dementia in his father; Glaucoma in his mother; Hypertension in his mother; Parkinsonism in his sister; Thyroid cancer in his son. There is no history of Coronary artery disease, Diabetes, Stroke, Prostate cancer, Colon cancer, Esophageal cancer, or Stomach cancer.  ROS:   Please see the history of present illness.    ROS  All other systems reviewed and negative.   EKGs/Labs/Other Studies Reviewed:    The following studies were reviewed today: none  EKG:  EKG is sinus bradycardia at 59 bpm with first-degree block and no ST-T wave abnormalities.    Recent Labs: 03/09/2018: ALT 25; BUN 15; Creatinine, Ser 0.97; Hemoglobin 12.8; Platelets 277.0; Potassium 4.3; Sodium 136   Recent Lipid Panel    Component Value Date/Time   CHOL 113 09/08/2017 0706   TRIG 114.0 09/08/2017 0706   TRIG 74 05/31/2006 0933   HDL 31.00 (L) 09/08/2017 0706   CHOLHDL 4 09/08/2017 0706   VLDL 22.8 09/08/2017 0706   LDLCALC 59 09/08/2017 0706    Physical Exam:    VS:  BP 128/72   Pulse (!) 59   Ht 6' (1.829 m)   Wt 220 lb 3.2 oz (99.9 kg)   SpO2 95%   BMI 29.86 kg/m     Wt Readings from Last 3 Encounters:  06/30/18 220 lb 3.2 oz (99.9 kg)  03/09/18 215 lb 3 oz (97.6 kg)  03/09/18 215 lb 3.2 oz (97.6 kg)     GEN:  Well nourished, well developed in no acute distress HEENT: Normal NECK: No JVD; No carotid bruits LYMPHATICS: No lymphadenopathy CARDIAC: RRR, no murmurs, rubs, gallops RESPIRATORY:  Clear to auscultation without rales, wheezing or rhonchi  ABDOMEN: Soft, non-tender, non-distended MUSCULOSKELETAL:  No edema; No deformity  SKIN: Warm and dry NEUROLOGIC:  Alert and oriented x 3 PSYCHIATRIC:  Normal affect   ASSESSMENT:    1. Premature atrial contractions   2. Essential hypertension   3. Pure hypercholesterolemia    PLAN:    In order of problems listed above:  1.  PACs -'s are well suppressed on Toprol.  2.  HTN  -BP is well controlled on exam today.  He will  continue on Cardizem CD 240 mg daily and Toprol-XL 100 mg daily.  3.  Hyperlipidemia -his LDL is at goal at 59 and  ALT was normal at 25.  He will continue on atorvastatin 20 mg daily.   Medication Adjustments/Labs and Tests Ordered: Current medicines are reviewed at length with the patient today.  Concerns regarding medicines are outlined above.  No orders of the defined types were placed in this encounter.  No orders of the defined types were placed in this encounter.   Signed, Fransico Him, MD  06/30/2018 8:04 AM    [place]

## 2018-07-12 ENCOUNTER — Other Ambulatory Visit: Payer: Self-pay | Admitting: Cardiology

## 2018-08-01 DIAGNOSIS — L570 Actinic keratosis: Secondary | ICD-10-CM | POA: Diagnosis not present

## 2018-08-01 DIAGNOSIS — L821 Other seborrheic keratosis: Secondary | ICD-10-CM | POA: Diagnosis not present

## 2018-08-01 DIAGNOSIS — Z85828 Personal history of other malignant neoplasm of skin: Secondary | ICD-10-CM | POA: Diagnosis not present

## 2018-08-01 DIAGNOSIS — L720 Epidermal cyst: Secondary | ICD-10-CM | POA: Diagnosis not present

## 2018-08-20 ENCOUNTER — Other Ambulatory Visit: Payer: Self-pay | Admitting: Internal Medicine

## 2018-09-07 ENCOUNTER — Other Ambulatory Visit: Payer: Self-pay

## 2018-09-07 ENCOUNTER — Ambulatory Visit (INDEPENDENT_AMBULATORY_CARE_PROVIDER_SITE_OTHER): Payer: Medicare Other | Admitting: Internal Medicine

## 2018-09-07 DIAGNOSIS — E119 Type 2 diabetes mellitus without complications: Secondary | ICD-10-CM | POA: Diagnosis not present

## 2018-09-07 DIAGNOSIS — I10 Essential (primary) hypertension: Secondary | ICD-10-CM | POA: Diagnosis not present

## 2018-09-07 DIAGNOSIS — I491 Atrial premature depolarization: Secondary | ICD-10-CM | POA: Diagnosis not present

## 2018-09-07 NOTE — Progress Notes (Signed)
Subjective:    Patient ID: Chase Martinez, male    DOB: [birth year], 78 y.o.   MRN: [id number]  DOS:  09/07/2018 Type of visit - description: Virtual Visit via Video Note  I connected with@ on 09/07/18 at  8:40 AM EDT by a video enabled telemedicine application and verified that I am speaking with the correct person using two identifiers.   THIS ENCOUNTER IS A VIRTUAL VISIT DUE TO COVID-19 - PATIENT WAS NOT SEEN IN THE OFFICE. PATIENT HAS CONSENTED TO VIRTUAL VISIT / TELEMEDICINE VISIT   Location of patient: home  Location of provider: office  I discussed the limitations of evaluation and management by telemedicine and the availability of in person appointments. The patient expressed understanding and agreed to proceed.  History of Present Illness: ROV Since the last time he was here few months ago he feels well. Good med compliance We went over his blood work, medication list, ambulatory blood sugars and blood pressures.  Coronavirus: He is following very well social distancing except that he is not using a mask consistently when he goes shopping.  He manage to being active and he still plays some golf.  Review of Systems Denies fever chills No chest pain no difficulty breathing.  No lower extremity edema.  Occasional palpitations, at baseline.   Past Medical History:  Diagnosis Date  . Basal cell carcinoma    dr Ronnald Ramp  . Diabetes mellitus   . Hyperlipidemia   . Hypertension   . 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 Premature atrial contractions     Past Surgical History:  Procedure Laterality Date  . CATARACT EXTRACTION Bilateral 2015  . PILONIDAL CYST EXCISION    . REFRACTIVE SURGERY    . SHOULDER SURGERY     right  . TONSILLECTOMY      Social History   Socioeconomic History  . Marital status: Married    Spouse name: Not on file  . Number of children: 2  . Years of education: Not on file  . Highest  education level: Not on file  Occupational History  . Occupation: Retired Therapist, art Engineering geologist)  Social Needs  . Financial resource strain: Not on file  . Food insecurity:    Worry: Not on file    Inability: Not on file  . Transportation needs:    Medical: Not on file    Non-medical: Not on file  Tobacco Use  . Smoking status: Former Smoker    Packs/day: 1.00    Types: Cigarettes, Cigars    Last attempt to quit: 05/24/1966    Years since quitting: 52.3  . Smokeless tobacco: Former Systems developer    Types: Snuff    Quit date: 05/24/1988  Substance and Sexual Activity  . Alcohol use: Yes    Alcohol/week: 7.0 - 14.0 standard drinks    Types: 7 - 14 Glasses of wine per week    Comment: occasional beer and wine  . Drug use: No  . Sexual activity: Yes  Lifestyle  . Physical activity:    Days per week: Not on file    Minutes per session: Not on file  . Stress: Not on file  Relationships  . Social connections:    Talks on phone: Not on file    Gets together: Not on file    Attends religious service: Not on file    Active member of club or organization: Not on file    Attends meetings  of clubs or organizations: Not on file    Relationship status: Not on file  . Intimate partner violence:    Fear of current or ex partner: Not on file    Emotionally abused: Not on file    Physically abused: Not on file    Forced sexual activity: Not on file  Other Topics Concern  . Not on file  Social History Narrative   Household-- pt and wife    Son lives near by, daughter near [place] as of 09/07/2018   No Known Allergies     Medication List       Accurate as of September 07, 2018  8:41 AM. Always use your most recent med list.        aspirin 81 MG tablet Take 81 mg by mouth daily.   atorvastatin 20 MG tablet Commonly known as:  LIPITOR Take 1 tablet (20 mg total) by mouth daily.   diltiazem 240 MG 24 hr tablet Commonly known as:  CARDIZEM LA TAKE 1 TABLET DAILY   Fish  Oil 1000 MG Caps Take 1 capsule by mouth daily.   freestyle lancets USE AS DIRECTED TO CHECK BLOOD SUGARS 3 TO 4 TIMES DAILY   FREESTYLE LITE test strip Generic drug:  glucose blood USE TO CHECK BLOOD SUGAR NOT MORE THAN TWICE A DAY   furosemide 20 MG tablet Commonly known as:  LASIX Take 1 tablet (20 mg total) by mouth daily.   glimepiride 2 MG tablet Commonly known as:  AMARYL Take 1 tablet (2 mg total) by mouth daily with breakfast.   metFORMIN 1000 MG tablet Commonly known as:  GLUCOPHAGE Take 1 tablet (1,000 mg total) by mouth 2 (two) times daily with a meal.   metoprolol succinate 100 MG 24 hr tablet Commonly known as:  Toprol XL Take 1 tablet (100 mg total) by mouth daily. Take with or immediately following a meal.   multivitamin per tablet Take 1 tablet by mouth daily.   sitaGLIPtin 100 MG tablet Commonly known as:  Januvia Take 1 tablet (100 mg total) by mouth daily.   tamsulosin 0.4 MG Caps capsule Commonly known as:  FLOMAX Take 1 capsule (0.4 mg total) by mouth daily after supper.           Objective:   Physical Exam There were no vitals taken for this visit. This is a very trial video conference, alert oriented x3 no apparent distress    Assessment     Assessment: DM--  + neuropathy HTN Hyperlipidemia PVCs  -- DR Thomasena Edis 03-2015, on CCB,BB BPH BCC Dr. Ronnald Ramp Nail dystrophy  Asbestosis dx 2019  PLAN: Virtual video visit DM: Currently on glimepiride, metformin, Januvia.  Last A1c satisfactory.  Average CBGs 127.  No change HTN: Currently on furosemide, metoprolol.  Ambulatory BPs in the 120s/60s.  No change PVCs: Saw Dr. Radford Pax 06-2018, stable. Under normal circumstances, I would prescribe labs including a BMP and a A1c however I feel it is safer for him to stay home. Coronavirus education: Encouraged to continue social distancing and consistent use of a mask. RTC 3 months, patient will call and make an appointment.    I discussed the  assessment and treatment plan with the patient. The patient was provided an opportunity to ask questions and all were answered. The patient agreed with the plan and demonstrated an understanding of the instructions.   The patient was advised to call back or seek an  in-person evaluation if the symptoms worsen or if the condition fails to improve as anticipated.

## 2018-09-08 NOTE — Assessment & Plan Note (Signed)
Virtual video visit DM: Currently on glimepiride, metformin, Januvia.  Last A1c satisfactory.  Average CBGs 127.  No change HTN: Currently on furosemide, metoprolol.  Ambulatory BPs in the 120s/60s.  No change PVCs: Saw Dr. Radford Pax 06-2018, stable. Under normal circumstances, I would prescribe labs including a BMP and a A1c however I feel it is safer for him to stay home. Coronavirus education: Encouraged to continue social distancing and consistent use of a mask. RTC 3 months, patient will call and make an appointment.

## 2018-09-20 DIAGNOSIS — L84 Corns and callosities: Secondary | ICD-10-CM | POA: Diagnosis not present

## 2018-09-20 DIAGNOSIS — E1351 Other specified diabetes mellitus with diabetic peripheral angiopathy without gangrene: Secondary | ICD-10-CM | POA: Diagnosis not present

## 2018-09-20 DIAGNOSIS — B351 Tinea unguium: Secondary | ICD-10-CM | POA: Diagnosis not present

## 2018-09-20 LAB — HM DIABETES FOOT EXAM

## 2018-10-17 ENCOUNTER — Other Ambulatory Visit: Payer: Self-pay | Admitting: Internal Medicine

## 2018-11-11 ENCOUNTER — Other Ambulatory Visit: Payer: Self-pay | Admitting: Internal Medicine

## 2018-11-21 ENCOUNTER — Other Ambulatory Visit: Payer: Self-pay | Admitting: Internal Medicine

## 2018-11-28 DIAGNOSIS — M205X1 Other deformities of toe(s) (acquired), right foot: Secondary | ICD-10-CM | POA: Diagnosis not present

## 2018-11-28 DIAGNOSIS — R238 Other skin changes: Secondary | ICD-10-CM | POA: Diagnosis not present

## 2018-11-28 DIAGNOSIS — L84 Corns and callosities: Secondary | ICD-10-CM | POA: Diagnosis not present

## 2018-11-28 DIAGNOSIS — E1351 Other specified diabetes mellitus with diabetic peripheral angiopathy without gangrene: Secondary | ICD-10-CM | POA: Diagnosis not present

## 2018-11-28 DIAGNOSIS — B351 Tinea unguium: Secondary | ICD-10-CM | POA: Diagnosis not present

## 2018-11-28 DIAGNOSIS — M205X2 Other deformities of toe(s) (acquired), left foot: Secondary | ICD-10-CM | POA: Diagnosis not present

## 2018-11-28 LAB — HM DIABETES FOOT EXAM

## 2018-12-02 ENCOUNTER — Other Ambulatory Visit: Payer: Self-pay | Admitting: Internal Medicine

## 2018-12-07 ENCOUNTER — Encounter: Payer: Self-pay | Admitting: Internal Medicine

## 2018-12-07 ENCOUNTER — Other Ambulatory Visit: Payer: Self-pay

## 2018-12-07 ENCOUNTER — Ambulatory Visit (INDEPENDENT_AMBULATORY_CARE_PROVIDER_SITE_OTHER): Payer: Medicare Other | Admitting: Internal Medicine

## 2018-12-07 VITALS — BP 127/62 | HR 56 | Temp 98.0°F | Resp 18 | Ht 72.0 in | Wt 210.4 lb

## 2018-12-07 DIAGNOSIS — I10 Essential (primary) hypertension: Secondary | ICD-10-CM

## 2018-12-07 DIAGNOSIS — E7800 Pure hypercholesterolemia, unspecified: Secondary | ICD-10-CM | POA: Diagnosis not present

## 2018-12-07 DIAGNOSIS — E1140 Type 2 diabetes mellitus with diabetic neuropathy, unspecified: Secondary | ICD-10-CM | POA: Diagnosis not present

## 2018-12-07 LAB — COMPREHENSIVE METABOLIC PANEL
ALT: 21 U/L (ref 0–53)
AST: 19 U/L (ref 0–37)
Albumin: 4 g/dL (ref 3.5–5.2)
Alkaline Phosphatase: 58 U/L (ref 39–117)
BUN: 16 mg/dL (ref 6–23)
CO2: 27 mEq/L (ref 19–32)
Calcium: 9 mg/dL (ref 8.4–10.5)
Chloride: 102 mEq/L (ref 96–112)
Creatinine, Ser: 0.88 mg/dL (ref 0.40–1.50)
GFR: 83.29 mL/min (ref >60.00)
Glucose, Bld: 159 mg/dL — ABNORMAL HIGH (ref 70–99)
Potassium: 4.5 mEq/L (ref 3.5–5.1)
Sodium: 136 mEq/L (ref 135–145)
Total Bilirubin: 0.5 mg/dL (ref 0.2–1.2)
Total Protein: 6.5 g/dL (ref 6.0–8.3)

## 2018-12-07 LAB — MICROALBUMIN / CREATININE URINE RATIO
Creatinine,U: 93.4 mg/dL
Microalb Creat Ratio: 0.8 mg/g (ref 0.0–30.0)
Microalb, Ur: 0.7 mg/dL (ref 0.0–1.9)

## 2018-12-07 LAB — LIPID PANEL
Cholesterol: 102 mg/dL (ref 0–200)
HDL: 35 mg/dL — ABNORMAL LOW (ref >39.00)
LDL Cholesterol: 41 mg/dL (ref 0–99)
NonHDL: 66.88
Total CHOL/HDL Ratio: 3
Triglycerides: 128 mg/dL (ref 0.0–149.0)
VLDL: 25.6 mg/dL (ref 0.0–40.0)

## 2018-12-07 LAB — HEMOGLOBIN A1C: Hgb A1c MFr Bld: 6.7 % — ABNORMAL HIGH (ref 4.6–6.5)

## 2018-12-07 NOTE — Progress Notes (Signed)
Subjective:    Patient ID: Chase Martinez, male    DOB: 1941-02-14, 78 y.o.   MRN: [id number]  DOS:  12/07/2018 Type of visit - description: Routine office visit DM: Good med compliance, ambulatory CBGs reviewed HTN: Excellent ambulatory BPs Dermatology: Sees Dr. Ronnald Ramp every 6 months High cholesterol: Due for labs    Review of Systems Good compliance with quarantine and social distancing.  Emotionally doing okay.  Plays plenty of golf every week Denies fever chills No chest pain no difficulty breathing No nausea, vomiting, diarrhea No cough or sputum production.  Past Medical History:  Diagnosis Date  . Basal cell carcinoma    dr Ronnald Ramp  . Diabetes mellitus   . Hyperlipidemia   . Hypertension   . 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 Premature atrial contractions     Past Surgical History:  Procedure Laterality Date  . CATARACT EXTRACTION Bilateral 2015  . PILONIDAL CYST EXCISION    . REFRACTIVE SURGERY    . SHOULDER SURGERY     right  . TONSILLECTOMY      Social History   Socioeconomic History  . Marital status: Married    Spouse name: Not on file  . Number of children: 2  . Years of education: Not on file  . Highest education level: Not on file  Occupational History  . Occupation: Retired Therapist, art Engineering geologist)  Social Needs  . Financial resource strain: Not on file  . Food insecurity    Worry: Not on file    Inability: Not on file  . Transportation needs    Medical: Not on file    Non-medical: Not on file  Tobacco Use  . Smoking status: Former Smoker    Packs/day: 1.00    Types: Cigarettes, Cigars    Quit date: 05/24/1966    Years since quitting: 52.5  . Smokeless tobacco: Former Systems developer    Types: Snuff    Quit date: 05/24/1988  Substance and Sexual Activity  . Alcohol use: Yes    Alcohol/week: 7.0 - 14.0 standard drinks    Types: 7 - 14 Glasses of wine per week    Comment: occasional beer and wine   . Drug use: No  . Sexual activity: Yes  Lifestyle  . Physical activity    Days per week: Not on file    Minutes per session: Not on file  . Stress: Not on file  Relationships  . Social Herbalist on phone: Not on file    Gets together: Not on file    Attends religious service: Not on file    Active member of club or organization: Not on file    Attends meetings of clubs or organizations: Not on file    Relationship status: Not on file  . Intimate partner violence    Fear of current or ex partner: Not on file    Emotionally abused: Not on file    Physically abused: Not on file    Forced sexual activity: Not on file  Other Topics Concern  . Not on file  Social History Narrative   Household-- pt and wife    Son lives near by, daughter near [place] as of 12/07/2018   No Known Allergies     Medication List       Accurate as of December 07, 2018 11:59 PM. If you have any questions, ask your  nurse or doctor.        aspirin 81 MG tablet Take 81 mg by mouth daily.   atorvastatin 20 MG tablet Commonly known as: LIPITOR Take 1 tablet (20 mg total) by mouth daily.   diltiazem 240 MG 24 hr tablet Commonly known as: CARDIZEM LA TAKE 1 TABLET DAILY   Fish Oil 1000 MG Caps Take 1 capsule by mouth daily.   freestyle lancets USE AS DIRECTED TO CHECK BLOOD SUGARS 3 TO 4 TIMES DAILY   FREESTYLE LITE test strip Generic drug: glucose blood USE TO CHECK BLOOD SUGAR NOT MORE THAN TWICE A DAY   furosemide 20 MG tablet Commonly known as: LASIX Take 1 tablet (20 mg total) by mouth daily.   glimepiride 2 MG tablet Commonly known as: AMARYL Take 1 tablet (2 mg total) by mouth daily with breakfast.   metFORMIN 1000 MG tablet Commonly known as: GLUCOPHAGE Take 1 tablet (1,000 mg total) by mouth 2 (two) times daily with a meal.   metoprolol succinate 100 MG 24 hr tablet Commonly known as: Toprol XL Take 1 tablet (100 mg total) by mouth daily. Take  with or immediately following a meal.   multivitamin per tablet Take 1 tablet by mouth daily.   sitaGLIPtin 100 MG tablet Commonly known as: Januvia Take 1 tablet (100 mg total) by mouth daily.   tamsulosin 0.4 MG Caps capsule Commonly known as: FLOMAX Take 1 capsule (0.4 mg total) by mouth daily after supper.   vitamin C 1000 MG tablet Take 1 tablet by mouth daily.   Vitamin D3 50 MCG (2000 UT) capsule Take 1 capsule by mouth daily.           Objective:   Physical Exam BP 127/62 (BP Location: Left Arm, Patient Position: Sitting, Cuff Size: Small)   Pulse (!) 56   Temp 98 F (36.7 C) (Oral)   Resp 18   Ht 6' (1.829 m)   Wt 210 lb 6 oz (95.4 kg)   SpO2 99%   BMI 28.53 kg/m  General:   Well developed, NAD, BMI noted.  HEENT:  Normocephalic . Face symmetric, atraumatic Lungs:  CTA B Normal respiratory effort, no intercostal retractions, no accessory muscle use. Heart: RRR,  no murmur.  no pretibial edema bilaterally  Abdomen:  Not distended, soft, non-tender. No rebound or rigidity.  No bruit Skin: Not pale. Not jaundice Neurologic:  alert & oriented X3.  Speech normal, gait appropriate for age and unassisted Psych--  Cognition and judgment appear intact.  Cooperative with normal attention span and concentration.  Behavior appropriate. No anxious or depressed appearing.     Assessment      Assessment: DM--  + neuropathy HTN Hyperlipidemia PVCs  -- DR Thomasena Edis 03-2015, on CCB,BB BPH BCC Dr. Ronnald Ramp Nail dystrophy  Asbestosis dx 2019  PLAN: DM: Currently on glimepiride, metformin, Januvia.  Checking A1c and micro.  Ambulatory CBGs excellent in the 120, 130 HTN: Continue Cardizem, Lasix, metoprolol.  Ambulatory BPs in the 120s/60s.  Check a CMP High cholesterol: On Lipitor, not fasting, check a lipid profile. BCC: Sees Dr. Ronnald Ramp regularly. BPH: Denies any symptoms today. Preventive care: S/p Shingrix x2 Strongly recommend flu shot earlier this  fall. RTC 6 months

## 2018-12-07 NOTE — Patient Instructions (Signed)
GO TO THE LAB : Get the blood work     GO TO THE FRONT DESK Schedule your next appointment  For a check up in 6 months    Get a flu shot early this season

## 2018-12-07 NOTE — Progress Notes (Signed)
Pre visit review using our clinic review tool, if applicable. No additional management support is needed unless otherwise documented below in the visit note. 

## 2018-12-08 ENCOUNTER — Other Ambulatory Visit: Payer: Self-pay | Admitting: Internal Medicine

## 2018-12-08 NOTE — Assessment & Plan Note (Signed)
DM: Currently on glimepiride, metformin, Januvia.  Checking A1c and micro.  Ambulatory CBGs excellent in the 120, 130 HTN: Continue Cardizem, Lasix, metoprolol.  Ambulatory BPs in the 120s/60s.  Check a CMP High cholesterol: On Lipitor, not fasting, check a lipid profile. BCC: Sees Dr. Ronnald Ramp regularly. BPH: Denies any symptoms today. Preventive care: S/p Shingrix x2 Strongly recommend flu shot earlier this fall. RTC 6 months

## 2018-12-19 DIAGNOSIS — H52203 Unspecified astigmatism, bilateral: Secondary | ICD-10-CM | POA: Diagnosis not present

## 2018-12-19 DIAGNOSIS — E119 Type 2 diabetes mellitus without complications: Secondary | ICD-10-CM | POA: Diagnosis not present

## 2018-12-19 DIAGNOSIS — H401231 Low-tension glaucoma, bilateral, mild stage: Secondary | ICD-10-CM | POA: Diagnosis not present

## 2018-12-19 DIAGNOSIS — H43813 Vitreous degeneration, bilateral: Secondary | ICD-10-CM | POA: Diagnosis not present

## 2018-12-19 LAB — HM DIABETES EYE EXAM

## 2018-12-21 ENCOUNTER — Encounter: Payer: Self-pay | Admitting: Internal Medicine

## 2018-12-21 DIAGNOSIS — H409 Unspecified glaucoma: Secondary | ICD-10-CM | POA: Insufficient documentation

## 2019-01-06 DIAGNOSIS — Z23 Encounter for immunization: Secondary | ICD-10-CM | POA: Diagnosis not present

## 2019-01-19 ENCOUNTER — Other Ambulatory Visit: Payer: Self-pay | Admitting: Internal Medicine

## 2019-02-13 DIAGNOSIS — B351 Tinea unguium: Secondary | ICD-10-CM | POA: Diagnosis not present

## 2019-02-13 DIAGNOSIS — Z85828 Personal history of other malignant neoplasm of skin: Secondary | ICD-10-CM | POA: Diagnosis not present

## 2019-02-13 DIAGNOSIS — L570 Actinic keratosis: Secondary | ICD-10-CM | POA: Diagnosis not present

## 2019-02-13 DIAGNOSIS — L98499 Non-pressure chronic ulcer of skin of other sites with unspecified severity: Secondary | ICD-10-CM | POA: Diagnosis not present

## 2019-02-13 DIAGNOSIS — L821 Other seborrheic keratosis: Secondary | ICD-10-CM | POA: Diagnosis not present

## 2019-02-13 DIAGNOSIS — E1351 Other specified diabetes mellitus with diabetic peripheral angiopathy without gangrene: Secondary | ICD-10-CM | POA: Diagnosis not present

## 2019-02-13 DIAGNOSIS — L84 Corns and callosities: Secondary | ICD-10-CM | POA: Diagnosis not present

## 2019-02-13 LAB — HM DIABETES FOOT EXAM

## 2019-02-15 ENCOUNTER — Other Ambulatory Visit: Payer: Self-pay | Admitting: Internal Medicine

## 2019-03-01 ENCOUNTER — Other Ambulatory Visit: Payer: Self-pay | Admitting: Internal Medicine

## 2019-03-01 ENCOUNTER — Encounter: Payer: Self-pay | Admitting: Internal Medicine

## 2019-03-12 ENCOUNTER — Other Ambulatory Visit: Payer: Self-pay

## 2019-03-12 NOTE — Progress Notes (Signed)
Virtual Visit via Video Note  I connected with patient on 03/13/19 at  3:15 PM EDT by audio enabled telemedicine application and verified that I am speaking with the correct person using two identifiers.   THIS ENCOUNTER IS A VIRTUAL VISIT DUE TO COVID-19 - PATIENT WAS NOT SEEN IN THE OFFICE. PATIENT HAS CONSENTED TO VIRTUAL VISIT / TELEMEDICINE VISIT   Location of patient: home  Location of provider: office  I discussed the limitations of evaluation and management by telemedicine and the availability of in person appointments. The patient expressed understanding and agreed to proceed.   Subjective:   Chase Martinez is a 78 y.o. male who presents for Medicare Annual/Subsequent preventive examination.  Pt [place] [address] [facility]. Plays / walks 18 holes of golf 3-5 times a wk. Wife plays with him in a couple's group on Fridays.   Review of Systems:   Home Safety/Smoke Alarms: Feels safe in home. Smoke alarms in place.  Lives with wife in 1 story home.  Male:   CCS- No longer doing routine screening due to age. Eye- Dr.Tanner every 6 months.   PSA-  Lab Results  Component Value Date   PSA 1.71 03/09/2018   PSA 1.89 01/06/2016   PSA 1.71 10/30/2013       Objective:    Vitals: Unable to assess. This visit is enabled though telemedicine due to Covid 19. See vitals from PCP visit this morning.    Advanced Directives 03/13/2019 03/09/2018 03/02/2017 08/03/2016 11/28/2015  Does Patient Have a Medical Advance Directive? Yes Yes Yes Yes Yes  Type of Paramedic of [place];Living will [place];Living will [place];Living will [place];Living will [place];Living will  Does patient want to make changes to medical advance directive? No - Patient declined - - - -  Copy of [place] in Chart? Yes - validated most recent copy scanned in chart (See row  information) No - copy requested No - copy requested No - copy requested -    Tobacco Social History   Tobacco Use  Smoking Status Former Smoker  . Packs/day: 1.00  . Types: Cigarettes, Cigars  . Quit date: 05/24/1966  . Years since quitting: 52.8  Smokeless Tobacco Former Systems developer  . Types: Snuff  . Quit date: 05/24/1988     Counseling given: Not Answered   Clinical Intake: Pain : No/denies pain     Past Medical History:  Diagnosis Date  . Basal cell carcinoma    dr Ronnald Ramp  . Diabetes mellitus   . Glaucoma   . Hyperlipidemia   . Hypertension   . 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 Premature atrial contractions    Past Surgical History:  Procedure Laterality Date  . CATARACT EXTRACTION Bilateral 2015  . PILONIDAL CYST EXCISION    . REFRACTIVE SURGERY    . SHOULDER SURGERY     right  . TONSILLECTOMY     Family History  Problem Relation Age of Onset  . Hypertension Mother   . Glaucoma Mother   . Dementia Father   . Parkinsonism Sister   . Thyroid cancer Son   . Coronary artery disease Neg Hx   . Diabetes Neg Hx   . Stroke Neg Hx   . Prostate cancer Neg Hx   . Colon cancer Neg Hx   . Esophageal cancer Neg Hx   .  Stomach cancer Neg Hx    Social History   Socioeconomic History  . Marital status: Married    Spouse name: Not on file  . Number of children: 2  . Years of education: Not on file  . Highest education level: Not on file  Occupational History  . Occupation: Retired Therapist, art Engineering geologist)  Social Needs  . Financial resource strain: Not on file  . Food insecurity    Worry: Not on file    Inability: Not on file  . Transportation needs    Medical: Not on file    Non-medical: Not on file  Tobacco Use  . Smoking status: Former Smoker    Packs/day: 1.00    Types: Cigarettes, Cigars    Quit date: 05/24/1966    Years since quitting: 52.8  . Smokeless tobacco: Former Systems developer    Types: Snuff    Quit  date: 05/24/1988  Substance and Sexual Activity  . Alcohol use: Yes    Alcohol/week: 7.0 - 14.0 standard drinks    Types: 7 - 14 Glasses of wine per week    Comment: occasional beer and wine  . Drug use: No  . Sexual activity: Yes  Lifestyle  . Physical activity    Days per week: Not on file    Minutes per session: Not on file  . Stress: Not on file  Relationships  . Social Herbalist on phone: Not on file    Gets together: Not on file    Attends religious service: Not on file    Active member of club or organization: Not on file    Attends meetings of clubs or organizations: Not on file    Relationship status: Not on file  Other Topics Concern  . Not on file  Social History Narrative   Household-- pt and wife    Son lives near by, daughter near [place]    Outpatient Encounter Medications as of 03/13/2019  Medication Sig  . Ascorbic Acid (VITAMIN C) 1000 MG tablet Take 1 tablet by mouth daily.  Marland Kitchen aspirin 81 MG tablet Take 81 mg by mouth daily.    Marland Kitchen atorvastatin (LIPITOR) 20 MG tablet Take 1 tablet (20 mg total) by mouth daily.  . Cholecalciferol (VITAMIN D3) 50 MCG (2000 UT) capsule Take 1 capsule by mouth daily.  Marland Kitchen diltiazem (CARDIZEM LA) 240 MG 24 hr tablet TAKE 1 TABLET DAILY  . FREESTYLE LITE test strip USE TO CHECK BLOOD SUGAR NOT MORE THAN TWICE A DAY  . furosemide (LASIX) 20 MG tablet Take 1 tablet (20 mg total) by mouth daily.  Marland Kitchen glimepiride (AMARYL) 2 MG tablet Take 1 tablet (2 mg total) by mouth daily with breakfast.  . Lancets (FREESTYLE) lancets USE AS DIRECTED TO CHECK BLOOD SUGARS 3 TO 4 TIMES DAILY  . metFORMIN (GLUCOPHAGE) 1000 MG tablet Take 1 tablet (1,000 mg total) by mouth 2 (two) times daily with a meal.  . metoprolol succinate (TOPROL XL) 100 MG 24 hr tablet Take 1 tablet (100 mg total) by mouth daily. Take with or immediately following a meal.  . multivitamin (THERAGRAN) per tablet Take 1 tablet by mouth daily.    . Omega-3 Fatty Acids  (FISH OIL) 1000 MG CAPS Take 1 capsule by mouth daily.   . sitaGLIPtin (JANUVIA) 100 MG tablet Take 1 tablet (100 mg total) by mouth daily.  . tamsulosin (FLOMAX) 0.4 MG CAPS capsule Take 1 capsule (0.4 mg total) by mouth daily after supper.  No facility-administered encounter medications on file as of 03/13/2019.     Activities of Daily Living In your present state of health, do you have any difficulty performing the following activities: 03/13/2019 03/13/2019  Hearing? N N  Vision? N N  Difficulty concentrating or making decisions? N N  Walking or climbing stairs? N N  Dressing or bathing? N N  Doing errands, shopping? N N  Preparing Food and eating ? N -  Using the Toilet? N -  In the past six months, have you accidently leaked urine? N -  Do you have problems with loss of bowel control? N -  Managing your Medications? N -  Managing your Finances? N -  Housekeeping or managing your Housekeeping? N -  Some recent data might be hidden    Patient Care Team: Colon Branch, MD as PCP - General Sueanne Margarita, MD as PCP - Cardiology (Cardiology) Rosemary Holms, DPM as Consulting Physician (Podiatry)   Assessment:   This is a routine wellness examination for [place]. Physical assessment deferred to PCP.   Exercise Activities and Dietary recommendations   Diet (meal preparation, eat out, water intake, caffeinated beverages, dairy products, fruits and vegetables): in general, a "healthy" diet  , well balanced     Goals    . Maintain current health. (pt-stated)       Fall Risk Fall Risk  03/13/2019 03/13/2019 12/07/2018 03/09/2018 03/02/2017  Falls in the past year? 0 0 0 No No  Number falls in past yr: 0 - 0 - -  Injury with Fall? 0 - - - -  Follow up - Falls evaluation completed - - -     Depression Screen PHQ 2/9 Scores 03/13/2019 03/13/2019 03/09/2018 03/02/2017  PHQ - 2 Score 0 0 0 0    Cognitive Function Ad8 score reviewed for issues:  Issues making  decisions:no  Less interest in hobbies / activities:no  Repeats questions, stories (family complaining):no  Trouble using ordinary gadgets (microwave, computer, phone):no  Forgets the month or year: no  Mismanaging finances: no  Remembering appts:no  Daily problems with thinking and/or memory:no Ad8 score is=0         Immunization History  Administered Date(s) Administered  . DTaP 06/21/2011  . H1N1 06/11/2008  . Hepatitis A 12/23/2015  . Influenza Split 02/22/2012  . Influenza Whole 04/05/2007, 02/13/2008, 02/18/2009, 01/20/2010  . Influenza, High Dose Seasonal PF 04/04/2013, 03/02/2017, 03/09/2018, 01/06/2019  . Influenza,inj,Quad PF,6+ Mos 03/07/2014  . Influenza-Unspecified 03/06/2015, 02/20/2016  . Pneumococcal Conjugate-13 10/30/2013  . Pneumococcal Polysaccharide-23 05/24/2002, 09/28/2007, 08/05/2016  . Td 05/24/2001, 05/21/2013  . Typhoid Inactivated 12/23/2015  . Yellow Fever 12/23/2015  . Zoster 05/24/2002  . Zoster Recombinat (Shingrix) 03/21/2018, 05/25/2018   Screening Tests Health Maintenance  Topic Date Due  . HEMOGLOBIN A1C  06/09/2019  . URINE MICROALBUMIN  12/07/2019  . OPHTHALMOLOGY EXAM  12/19/2019  . FOOT EXAM  02/13/2020  . TETANUS/TDAP  05/22/2023  . INFLUENZA VACCINE  Completed  . PNA vac Low Risk Adult  Completed        Plan:   See you next year!  Continue to eat heart healthy diet (full of fruits, vegetables, whole grains, lean protein, water--limit salt, fat, and sugar intake) and increase physical activity as tolerated.  Continue doing brain stimulating activities (puzzles, reading, adult coloring books, staying active) to keep memory sharp.   Bring a copy of your living will and/or healthcare power of attorney to your next office visit.  I have personally reviewed and noted the following in the patient's chart:   . Medical and social history . Use of alcohol, tobacco or illicit drugs  . Current medications and supplements  . Functional ability and status . Nutritional status . Physical activity . Advanced directives . List of other physicians . Hospitalizations, surgeries, and ER visits in previous 12 months . Vitals . Screenings to include cognitive, depression, and falls . Referrals and appointments  In addition, I have reviewed and discussed with patient certain preventive protocols, quality metrics, and best practice recommendations. A written personalized care plan for preventive services as well as general preventive health recommendations were provided to patient.     Shela Nevin, [place]  03/13/2019

## 2019-03-13 ENCOUNTER — Ambulatory Visit (INDEPENDENT_AMBULATORY_CARE_PROVIDER_SITE_OTHER): Payer: Medicare Other | Admitting: *Deleted

## 2019-03-13 ENCOUNTER — Encounter: Payer: Self-pay | Admitting: Internal Medicine

## 2019-03-13 ENCOUNTER — Encounter: Payer: Self-pay | Admitting: *Deleted

## 2019-03-13 ENCOUNTER — Ambulatory Visit (INDEPENDENT_AMBULATORY_CARE_PROVIDER_SITE_OTHER): Payer: Medicare Other | Admitting: Internal Medicine

## 2019-03-13 VITALS — BP 127/60 | HR 57 | Temp 95.9°F | Resp 18 | Ht 72.0 in | Wt 216.4 lb

## 2019-03-13 DIAGNOSIS — Z0000 Encounter for general adult medical examination without abnormal findings: Secondary | ICD-10-CM | POA: Diagnosis not present

## 2019-03-13 DIAGNOSIS — E7800 Pure hypercholesterolemia, unspecified: Secondary | ICD-10-CM

## 2019-03-13 DIAGNOSIS — E1140 Type 2 diabetes mellitus with diabetic neuropathy, unspecified: Secondary | ICD-10-CM | POA: Diagnosis not present

## 2019-03-13 DIAGNOSIS — I10 Essential (primary) hypertension: Secondary | ICD-10-CM | POA: Diagnosis not present

## 2019-03-13 LAB — CBC WITH DIFFERENTIAL/PLATELET
Basophils Absolute: 0 10*3/uL (ref 0.0–0.1)
Basophils Relative: 0.6 % (ref 0.0–3.0)
Eosinophils Absolute: 0.2 10*3/uL (ref 0.0–0.7)
Eosinophils Relative: 3.3 % (ref 0.0–5.0)
HCT: 35.4 % — ABNORMAL LOW (ref 39.0–52.0)
Hemoglobin: 12 g/dL — ABNORMAL LOW (ref 13.0–17.0)
Lymphocytes Relative: 20.5 % (ref 12.0–46.0)
Lymphs Abs: 1.3 10*3/uL (ref 0.7–4.0)
MCHC: 33.9 g/dL (ref 30.0–36.0)
MCV: 94.5 fl (ref 78.0–100.0)
Monocytes Absolute: 0.4 10*3/uL (ref 0.1–1.0)
Monocytes Relative: 6.8 % (ref 3.0–12.0)
Neutro Abs: 4.5 10*3/uL (ref 1.4–7.7)
Neutrophils Relative %: 68.8 % (ref 43.0–77.0)
Platelets: 276 10*3/uL (ref 150.0–400.0)
RBC: 3.75 Mil/uL — ABNORMAL LOW (ref 4.22–5.81)
RDW: 13.4 % (ref 11.5–15.5)
WBC: 6.5 10*3/uL (ref 4.0–10.5)

## 2019-03-13 LAB — MICROALBUMIN / CREATININE URINE RATIO
Creatinine,U: 62.2 mg/dL
Microalb Creat Ratio: 1.3 mg/g (ref 0.0–30.0)
Microalb, Ur: 0.8 mg/dL (ref 0.0–1.9)

## 2019-03-13 LAB — HEMOGLOBIN A1C: Hgb A1c MFr Bld: 7.1 % — ABNORMAL HIGH (ref 4.6–6.5)

## 2019-03-13 NOTE — Patient Instructions (Signed)
See you next year!  Continue to eat heart healthy diet (full of fruits, vegetables, whole grains, lean protein, water--limit salt, fat, and sugar intake) and increase physical activity as tolerated.  Continue doing brain stimulating activities (puzzles, reading, adult coloring books, staying active) to keep memory sharp.   Bring a copy of your living will and/or healthcare power of attorney to your next office visit.   Chase Martinez , Thank you for taking time to come for your Medicare Wellness Visit. I appreciate your ongoing commitment to your health goals. Please review the following plan we discussed and let me know if I can assist you in the future.   These are the goals we discussed: Goals    . Maintain current health. (pt-stated)       This is a list of the screening recommended for you and due dates:  Health Maintenance  Topic Date Due  . Hemoglobin A1C  06/09/2019  . Urine Protein Check  12/07/2019  . Eye exam for diabetics  12/19/2019  . Complete foot exam   02/13/2020  . Tetanus Vaccine  05/22/2023  . Flu Shot  Completed  . Pneumonia vaccines  Completed    Preventive Care 81 Years and Older, Male Preventive care refers to lifestyle choices and visits with your health care provider that can promote health and wellness. This includes:  A yearly physical exam. This is also called an annual well check.  Regular dental and eye exams.  Immunizations.  Screening for certain conditions.  Healthy lifestyle choices, such as diet and exercise. What can I expect for my preventive care visit? Physical exam Your health care provider will check:  Height and weight. These may be used to calculate body mass index (BMI), which is a measurement that tells if you are at a healthy weight.  Heart rate and blood pressure.  Your skin for abnormal spots. Counseling Your health care provider may ask you questions about:  Alcohol, tobacco, and drug use.  Emotional well-being.   Home and relationship well-being.  Sexual activity.  Eating habits.  History of falls.  Memory and ability to understand (cognition).  Work and work Statistician. What immunizations do I need?  Influenza (flu) vaccine  This is recommended every year. Tetanus, diphtheria, and pertussis (Tdap) vaccine  You may need a Td booster every 10 years. Varicella (chickenpox) vaccine  You may need this vaccine if you have not already been vaccinated. Zoster (shingles) vaccine  You may need this after age 40. Pneumococcal conjugate (PCV13) vaccine  One dose is recommended after age 16. Pneumococcal polysaccharide (PPSV23) vaccine  One dose is recommended after age 3. Measles, mumps, and rubella (MMR) vaccine  You may need at least one dose of MMR if you were born in 1957 or later. You may also need a second dose. Meningococcal conjugate (MenACWY) vaccine  You may need this if you have certain conditions. Hepatitis A vaccine  You may need this if you have certain conditions or if you travel or work in places where you may be exposed to hepatitis A. Hepatitis B vaccine  You may need this if you have certain conditions or if you travel or work in places where you may be exposed to hepatitis B. Haemophilus influenzae type b (Hib) vaccine  You may need this if you have certain conditions. You may receive vaccines as individual doses or as more than one vaccine together in one shot (combination vaccines). Talk with your health care provider about the risks  and benefits of combination vaccines. What tests do I need? Blood tests  Lipid and cholesterol levels. These may be checked every 5 years, or more frequently depending on your overall health.  Hepatitis C test.  Hepatitis B test. Screening  Lung cancer screening. You may have this screening every year starting at age 48 if you have a 30-pack-year history of smoking and currently smoke or have quit within the past 15 years.   Colorectal cancer screening. All adults should have this screening starting at age 33 and continuing until age 26. Your health care provider may recommend screening at age 77 if you are at increased risk. You will have tests every 1-10 years, depending on your results and the type of screening test.  Prostate cancer screening. Recommendations will vary depending on your family history and other risks.  Diabetes screening. This is done by checking your blood sugar (glucose) after you have not eaten for a while (fasting). You may have this done every 1-3 years.  Abdominal aortic aneurysm (AAA) screening. You may need this if you are a current or former smoker.  Sexually transmitted disease (STD) testing. Follow these instructions at home: Eating and drinking  Eat a diet that includes fresh fruits and vegetables, whole grains, lean protein, and low-fat dairy products. Limit your intake of foods with high amounts of sugar, saturated fats, and salt.  Take vitamin and mineral supplements as recommended by your health care provider.  Do not drink alcohol if your health care provider tells you not to drink.  If you drink alcohol: ? Limit how much you have to 0-2 drinks a day. ? Be aware of how much alcohol is in your drink. In the [place] one drink equals one 12 oz bottle of beer (355 mL), one 5 oz glass of wine (148 mL), or one 1 oz glass of hard liquor (44 mL). Lifestyle  Take daily care of your teeth and gums.  Stay active. Exercise for at least 30 minutes on 5 or more days each week.  Do not use any products that contain nicotine or tobacco, such as cigarettes, e-cigarettes, and chewing tobacco. If you need help quitting, ask your health care provider.  If you are sexually active, practice safe sex. Use a condom or other form of protection to prevent STIs (sexually transmitted infections).  Talk with your health care provider about taking a low-dose aspirin or statin. What's next?  Visit  your health care provider once a year for a well check visit.  Ask your health care provider how often you should have your eyes and teeth checked.  Stay up to date on all vaccines. This information is not intended to replace advice given to you by your health care provider. Make sure you discuss any questions you have with your health care provider. Document Released: 06/06/2015 Document Revised: 05/04/2018 Document Reviewed: 05/04/2018 Elsevier Patient Education  2020 [employer].

## 2019-03-13 NOTE — Progress Notes (Signed)
Pre visit review using our clinic review tool, if applicable. No additional management support is needed unless otherwise documented below in the visit note. 

## 2019-03-13 NOTE — Patient Instructions (Signed)
GO TO THE LAB : Get the blood work     GO TO THE FRONT DESK Schedule your next appointment for a checkup in 4 to 5 months  Your blood sugar and blood pressure are great, let me know if there are any changes

## 2019-03-13 NOTE — Progress Notes (Signed)
Subjective:    Patient ID: Chase Martinez, male    DOB: [birth year], 78 y.o.   MRN: [id number]  DOS:  03/13/2019 Type of visit - description: Routine visit DM: Good med compliance, ambulatory CBGs reviewed HTN: Good med compliance, ambulatory BP reviewed. He is doing well, has no major concerns   Review of Systems Remains active playing golf, denies chest pain, difficulty breathing. No nausea, vomiting, diarrhea.   Past Medical History:  Diagnosis Date  . Basal cell carcinoma    dr Ronnald Ramp  . Diabetes mellitus   . Glaucoma   . Hyperlipidemia   . Hypertension   . 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 Premature atrial contractions     Past Surgical History:  Procedure Laterality Date  . CATARACT EXTRACTION Bilateral 2015  . PILONIDAL CYST EXCISION    . REFRACTIVE SURGERY    . SHOULDER SURGERY     right  . TONSILLECTOMY      Social History   Socioeconomic History  . Marital status: Married    Spouse name: Not on file  . Number of children: 2  . Years of education: Not on file  . Highest education level: Not on file  Occupational History  . Occupation: Retired Therapist, art Engineering geologist)  Social Needs  . Financial resource strain: Not on file  . Food insecurity    Worry: Not on file    Inability: Not on file  . Transportation needs    Medical: Not on file    Non-medical: Not on file  Tobacco Use  . Smoking status: Former Smoker    Packs/day: 1.00    Types: Cigarettes, Cigars    Quit date: 05/24/1966    Years since quitting: 52.8  . Smokeless tobacco: Former Systems developer    Types: Snuff    Quit date: 05/24/1988  Substance and Sexual Activity  . Alcohol use: Yes    Alcohol/week: 7.0 - 14.0 standard drinks    Types: 7 - 14 Glasses of wine per week    Comment: occasional beer and wine  . Drug use: No  . Sexual activity: Yes  Lifestyle  . Physical activity    Days per week: Not on file    Minutes per session: Not on  file  . Stress: Not on file  Relationships  . Social Herbalist on phone: Not on file    Gets together: Not on file    Attends religious service: Not on file    Active member of club or organization: Not on file    Attends meetings of clubs or organizations: Not on file    Relationship status: Not on file  . Intimate partner violence    Fear of current or ex partner: Not on file    Emotionally abused: Not on file    Physically abused: Not on file    Forced sexual activity: Not on file  Other Topics Concern  . Not on file  Social History Narrative   Household-- pt and wife    Son lives near by, daughter near [place] as of 03/13/2019   No Known Allergies     Medication List       Accurate as of March 13, 2019 11:59 PM. If you have any questions, ask your nurse or doctor.        aspirin 81 MG tablet Take 81 mg by mouth daily.  atorvastatin 20 MG tablet Commonly known as: LIPITOR Take 1 tablet (20 mg total) by mouth daily.   diltiazem 240 MG 24 hr tablet Commonly known as: CARDIZEM LA TAKE 1 TABLET DAILY   Fish Oil 1000 MG Caps Take 1 capsule by mouth daily.   freestyle lancets USE AS DIRECTED TO CHECK BLOOD SUGARS 3 TO 4 TIMES DAILY   FREESTYLE LITE test strip Generic drug: glucose blood USE TO CHECK BLOOD SUGAR NOT MORE THAN TWICE A DAY   furosemide 20 MG tablet Commonly known as: LASIX Take 1 tablet (20 mg total) by mouth daily.   glimepiride 2 MG tablet Commonly known as: AMARYL Take 1 tablet (2 mg total) by mouth daily with breakfast.   metFORMIN 1000 MG tablet Commonly known as: GLUCOPHAGE Take 1 tablet (1,000 mg total) by mouth 2 (two) times daily with a meal.   metoprolol succinate 100 MG 24 hr tablet Commonly known as: Toprol XL Take 1 tablet (100 mg total) by mouth daily. Take with or immediately following a meal.   multivitamin per tablet Take 1 tablet by mouth daily.   sitaGLIPtin 100 MG tablet  Commonly known as: Januvia Take 1 tablet (100 mg total) by mouth daily.   tamsulosin 0.4 MG Caps capsule Commonly known as: FLOMAX Take 1 capsule (0.4 mg total) by mouth daily after supper.   vitamin C 1000 MG tablet Take 1 tablet by mouth daily.   Vitamin D3 50 MCG (2000 UT) capsule Take 1 capsule by mouth daily.           Objective:   Physical Exam BP 127/60 (BP Location: Left Arm, Patient Position: Sitting, Cuff Size: Small)   Pulse (!) 57   Temp (!) 95.9 F (35.5 C) (Temporal)   Resp 18   Ht 6' (1.829 m)   Wt 216 lb 6 oz (98.1 kg)   SpO2 99%   BMI 29.35 kg/m  General:   Well developed, NAD, BMI noted. HEENT:  Normocephalic . Face symmetric, atraumatic Lungs:  CTA B Normal respiratory effort, no intercostal retractions, no accessory muscle use. Heart: RRR,  no murmur.  No pretibial edema bilaterally  Skin: Not pale. Not jaundice Neurologic:  alert & oriented X3.  Speech normal, gait appropriate for age and unassisted Psych--  Cognition and judgment appear intact.  Cooperative with normal attention span and concentration.  Behavior appropriate. No anxious or depressed appearing.      Assessment     Assessment: DM--  + neuropathy HTN Hyperlipidemia PVCs  -- DR Thomasena Edis 03-2015, on CCB,BB BPH BCC Dr. Ronnald Ramp Nail dystrophy  Asbestosis dx 2019  PLAN: Preventive care reviewed DM: Continue glimepiride, Metformin, Januvia.  Ambulatory CBGs on average 136.  Check A1c and micro. Neuropathy: Sees podiatry regularly.  No concerns HTN: Continue Cardizem, Lasix, metoprolol.  Last BMP satisfactory.  No change, check a CBC. Social: Wife has A. fib, some memory issues. RTC 4 to 5 months

## 2019-03-14 NOTE — Assessment & Plan Note (Signed)
-   Tdap 2013 [facility] 23 2009, 2018 - prevnar 2015 - zostavax  2009 aprox - s/p shingrix - had a Flu shot  Prostate Ca screening: consitently normal PSAs , last PSA 2019  CCS: Cscope--  Dr Vladimir Faster in 2002 ,another Cscope 3-13, 1 polyp, colonoscopy 07-2016, next in 5 years or at the discretion of pt and PCP

## 2019-03-14 NOTE — Assessment & Plan Note (Signed)
Preventive care reviewed DM: Continue glimepiride, Metformin, Januvia.  Ambulatory CBGs on average 136.  Check A1c and micro. Neuropathy: Sees podiatry regularly.  No concerns HTN: Continue Cardizem, Lasix, metoprolol.  Last BMP satisfactory.  No change, check a CBC. Social: Wife has A. fib, some memory issues. RTC 4 to 5 months

## 2019-03-19 ENCOUNTER — Telehealth: Payer: Self-pay

## 2019-03-19 MED ORDER — JARDIANCE 10 MG PO TABS: 10.0000 mg | ORAL_TABLET | Freq: Every day | ORAL | 1 refills | Status: DC

## 2019-03-19 NOTE — Telephone Encounter (Signed)
Copied from [place] [id number]. Topic: General - Other >> Mar 19, 2019  2:30 PM Pauline Good wrote: Reason for CRM: pt calling and stated to go ahead and sent his new medication for his blood sugar to [facility]- Vania Rea

## 2019-03-19 NOTE — Telephone Encounter (Signed)
Please advise- see results from 03/13/2019.

## 2019-03-19 NOTE — Telephone Encounter (Signed)
Advise pt D/c glimeperide Start Jardiance 10 mg 1 po qd ,  #90, 1 RF Stay well hydrated RTC 3 months

## 2019-03-19 NOTE — Telephone Encounter (Signed)
Mychart message sent to Pt w/ recommendations. Glimepiride d/c. Jardiance 10mg  sent to [facility].

## 2019-03-27 DIAGNOSIS — H401231 Low-tension glaucoma, bilateral, mild stage: Secondary | ICD-10-CM | POA: Diagnosis not present

## 2019-04-02 DIAGNOSIS — M1711 Unilateral primary osteoarthritis, right knee: Secondary | ICD-10-CM | POA: Diagnosis not present

## 2019-04-15 ENCOUNTER — Other Ambulatory Visit: Payer: Self-pay | Admitting: Internal Medicine

## 2019-04-24 DIAGNOSIS — L84 Corns and callosities: Secondary | ICD-10-CM | POA: Diagnosis not present

## 2019-04-24 DIAGNOSIS — E1351 Other specified diabetes mellitus with diabetic peripheral angiopathy without gangrene: Secondary | ICD-10-CM | POA: Diagnosis not present

## 2019-04-24 DIAGNOSIS — M79671 Pain in right foot: Secondary | ICD-10-CM | POA: Diagnosis not present

## 2019-04-24 DIAGNOSIS — B351 Tinea unguium: Secondary | ICD-10-CM | POA: Diagnosis not present

## 2019-04-30 DIAGNOSIS — M25511 Pain in right shoulder: Secondary | ICD-10-CM | POA: Diagnosis not present

## 2019-05-21 ENCOUNTER — Ambulatory Visit: Payer: Self-pay

## 2019-05-21 NOTE — Telephone Encounter (Signed)
Returned call to patient who states that he has had abdominal pain that is stabbing about waste high and left side. He states that last week he had some loose stools and cleared it with imodium. He has normal BM's today. He states the pain is only when he is up and moving. He rates the pain 5-6. He states he does have a hernia. Care advice read to patient. He verbalized understanding. Patient will go to UC for evaluation tonight.   Reason for Disposition . [1] MILD-MODERATE pain AND [2] constant AND [3] present > 2 hours  Answer Assessment - Initial Assessment Questions 1. LOCATION: "Where does it hurt?"      Around waist left side 2. RADIATION: "Does the pain shoot anywhere else?" (e.g., chest, back)     no 3. ONSET: "When did the pain begin?" (Minutes, hours or days ago)      Late friday 4. SUDDEN: "Gradual or sudden onset?"     sudden 5. PATTERN "Does the pain come and go, or is it constant?"    - If constant: "Is it getting better, staying the same, or worsening?"      (Note: Constant means the pain never goes away completely; most serious pain is constant and it progresses)     - If intermittent: "How long does it last?" "Do you have pain now?"     (Note: Intermittent means the pain goes away completely between bouts)     Come and goes 6. SEVERITY: "How bad is the pain?"  (e.g., Scale 1-10; mild, moderate, or severe)    - MILD (1-3): doesn't interfere with normal activities, abdomen soft and not tender to touch     - MODERATE (4-7): interferes with normal activities or awakens from sleep, tender to touch     - SEVERE (8-10): excruciating pain, doubled over, unable to do any normal activities      4-5 7. RECURRENT SYMPTOM: "Have you ever had this type of abdominal pain before?" If so, ask: "When was the last time?" and "What happened that time?"      No  8. CAUSE: "What do you think is causing the abdominal pain?"     No feels more when moving around 9. RELIEVING/AGGRAVATING FACTORS:  "What makes it better or worse?" (e.g., movement, antacids, bowel movement)     Movement makes worse 10. OTHER SYMPTOMS: "Has there been any vomiting, diarrhea, constipation, or urine problems?"       Diarrhea before the pain stated diarrhea last week pain this week  Protocols used: ABDOMINAL PAIN - MALE-A-AH

## 2019-05-22 ENCOUNTER — Ambulatory Visit (INDEPENDENT_AMBULATORY_CARE_PROVIDER_SITE_OTHER): Payer: Medicare Other | Admitting: Internal Medicine

## 2019-05-22 ENCOUNTER — Other Ambulatory Visit: Payer: Self-pay

## 2019-05-22 VITALS — BP 111/54 | HR 61 | Temp 96.4°F | Resp 12 | Ht 72.0 in | Wt 202.6 lb

## 2019-05-22 DIAGNOSIS — R1032 Left lower quadrant pain: Secondary | ICD-10-CM

## 2019-05-22 LAB — POC URINALSYSI DIPSTICK (AUTOMATED)
Bilirubin, UA: NEGATIVE
Blood, UA: NEGATIVE
Glucose, UA: POSITIVE — AB
Ketones, UA: NEGATIVE
Leukocytes, UA: NEGATIVE
Nitrite, UA: NEGATIVE
Protein, UA: NEGATIVE
Spec Grav, UA: 1.015 (ref 1.010–1.025)
Urobilinogen, UA: 0.2 E.U./dL
pH, UA: 5.5 (ref 5.0–8.0)

## 2019-05-22 MED ORDER — AMOXICILLIN-POT CLAVULANATE 875-125 MG PO TABS: 1.0000 | ORAL_TABLET | Freq: Two times a day (BID) | ORAL | 0 refills | Status: DC

## 2019-05-22 NOTE — Telephone Encounter (Signed)
FYI Dr. Larose Kells

## 2019-05-22 NOTE — Progress Notes (Signed)
3

## 2019-05-22 NOTE — Progress Notes (Signed)
Subjective:    Patient ID: Chase Martinez, male    DOB: 06-Nov-1940, 78 y.o.   MRN: [id number]  DOS:  05/22/2019 Type of visit - description: Acute Symptoms restarted approximately 4 days ago:  Pain located at the left lower quadrant, described as "gas", " pulled muscle", no radiation. Decreased with rest, increased with certain movements.  Review of Systems Denies fever chills Appetite is okay No rash @ the area No bulging mass at the abdominal wall No nausea, vomiting. Had some loose stool 2 weeks ago but that is largely resolved. No constipation No dysuria or gross hematuria or difficulty urinating  Past Medical History:  Diagnosis Date  . Basal cell carcinoma    dr Ronnald Ramp  . Diabetes mellitus   . Glaucoma   . Hyperlipidemia   . Hypertension   . 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 Premature atrial contractions     Past Surgical History:  Procedure Laterality Date  . CATARACT EXTRACTION Bilateral 2015  . PILONIDAL CYST EXCISION    . REFRACTIVE SURGERY    . SHOULDER SURGERY     right  . TONSILLECTOMY      Social History   Socioeconomic History  . Marital status: Married    Spouse name: Not on file  . Number of children: 2  . Years of education: Not on file  . Highest education level: Not on file  Occupational History  . Occupation: Retired Therapist, art Engineering geologist)  Tobacco Use  . Smoking status: Former Smoker    Packs/day: 1.00    Types: Cigarettes, Cigars    Quit date: 05/24/1966    Years since quitting: 53.0  . Smokeless tobacco: Former Systems developer    Types: Snuff    Quit date: 05/24/1988  Substance and Sexual Activity  . Alcohol use: Yes    Alcohol/week: 7.0 - 14.0 standard drinks    Types: 7 - 14 Glasses of wine per week    Comment: occasional beer and wine  . Drug use: No  . Sexual activity: Yes  Other Topics Concern  . Not on file  Social History Narrative   Household-- pt and wife    Son lives near  by, daughter near [place] Strain:   . Difficulty of Paying Living Expenses: Not on file  Food Insecurity:   . Worried About Charity fundraiser in the Last Year: Not on file  . Ran Out of Food in the Last Year: Not on file  Transportation Needs:   . Lack of Transportation (Medical): Not on file  . Lack of Transportation (Non-Medical): Not on file  Physical Activity:   . Days of Exercise per Week: Not on file  . Minutes of Exercise per Session: Not on file  Stress:   . Feeling of Stress : Not on file  Social Connections:   . Frequency of Communication with Friends and Family: Not on file  . Frequency of Social Gatherings with Friends and Family: Not on file  . Attends Religious Services: Not on file  . Active Member of Clubs or Organizations: Not on file  . Attends Archivist Meetings: Not on file  . Marital Status: Not on file  Intimate Partner Violence:   . Fear of Current or Ex-Partner: Not on file  . Emotionally Abused: Not on file  . Physically Abused: Not on file  . Sexually Abused: Not  on file      Allergies as of 05/22/2019   No Known Allergies     Medication List       Accurate as of May 22, 2019  2:35 PM. If you have any questions, ask your nurse or doctor.        aspirin 81 MG tablet Take 81 mg by mouth daily.   atorvastatin 20 MG tablet Commonly known as: LIPITOR Take 1 tablet (20 mg total) by mouth daily.   diltiazem 240 MG 24 hr tablet Commonly known as: CARDIZEM LA TAKE 1 TABLET DAILY   Fish Oil 1000 MG Caps Take 1 capsule by mouth daily.   freestyle lancets USE AS DIRECTED TO CHECK BLOOD SUGARS 3 TO 4 TIMES DAILY What changed: See the new instructions.   FREESTYLE LITE test strip Generic drug: glucose blood USE TO CHECK BLOOD SUGAR NOT MORE THAN TWICE A DAY   furosemide 20 MG tablet Commonly known as: LASIX Take 1 tablet (20 mg total) by mouth daily.   Jardiance  10 MG Tabs tablet Generic drug: empagliflozin Take 10 mg by mouth daily before breakfast.   metFORMIN 1000 MG tablet Commonly known as: GLUCOPHAGE Take 1 tablet (1,000 mg total) by mouth 2 (two) times daily with a meal.   metoprolol succinate 100 MG 24 hr tablet Commonly known as: Toprol XL Take 1 tablet (100 mg total) by mouth daily. Take with or immediately following a meal.   multivitamin per tablet Take 1 tablet by mouth daily.   sitaGLIPtin 100 MG tablet Commonly known as: Januvia Take 1 tablet (100 mg total) by mouth daily.   tamsulosin 0.4 MG Caps capsule Commonly known as: FLOMAX Take 1 capsule (0.4 mg total) by mouth daily after supper.   vitamin C 1000 MG tablet Take 1 tablet by mouth daily.   Vitamin D3 50 MCG (2000 UT) capsule Take 1 capsule by mouth daily.           Objective:   Physical Exam Abdominal:      BP (!) 111/54 (BP Location: Left Arm, Cuff Size: Normal)   Pulse 61   Temp (!) 96.4 F (35.8 C) (Temporal)   Resp 12   Ht 6' (1.829 m)   Wt 202 lb 9.6 oz (91.9 kg)   SpO2 99%   BMI 27.48 kg/m  General:   Well developed, NAD, BMI noted.  HEENT:  Normocephalic . Face symmetric, atraumatic Lungs:  CTA B Normal respiratory effort, no intercostal retractions, no accessory muscle use. Heart: RRR,  no murmur.  no pretibial edema bilaterally  Abdomen:  Not distended, soft, tender on the left side, see graphic.  No mass or rebound. GU: Scrotal contents normal.  No inguinal hernia Skin: Not pale. Not jaundice Neurologic:  alert & oriented X3.  Speech normal, gait appropriate for age and unassisted Psych--  Cognition and judgment appear intact.  Cooperative with normal attention span and concentration.  Behavior appropriate. No anxious or depressed appearing.     Assessment     Assessment: DM--  + neuropathy HTN Hyperlipidemia PVCs  -- DR Thomasena Edis 03-2015, on CCB,BB BPH BCC Dr. Ronnald Ramp Nail dystrophy  Asbestosis dx  2019  PLAN: Left abdominal pain: As described above, DDx includes MSK problem, diverticulitis versus others. Never had abdominal surgery, colonoscopy 2018 showed polyps and diverticuli. Udip negative Discussed with patient options: Empirical treatment for presumed diverticulitis versus abdomen pelvis CT with contrast, we both agree to treat first. Plan: UA, urine culture, CBC,  BMP. Augmentin Further evaluation (CT) if not better or if he has another episode of  left abdominal pain.   This visit occurred during the SARS-CoV-2 public health emergency.  Safety protocols were in place, including screening questions prior to the visit, additional usage of staff PPE, and extensive cleaning of exam room while observing appropriate contact time as indicated for disinfecting solutions.

## 2019-05-22 NOTE — Telephone Encounter (Signed)
Please check on the patient, what was the outcome of the urgent care visit

## 2019-05-22 NOTE — Patient Instructions (Addendum)
--  GO TO THE LAB : Get the blood work     Start Augmentin 1 tablet twice a day  Drink plenty fluids  Call me if not gradually better  Call anytime if fever, chills, severe pain.

## 2019-05-22 NOTE — Telephone Encounter (Signed)
Patient seen in office today. 

## 2019-05-23 LAB — URINALYSIS, MICROSCOPIC ONLY
RBC / HPF: NONE SEEN (ref 0–?)
WBC, UA: NONE SEEN (ref 0–?)

## 2019-05-23 LAB — BASIC METABOLIC PANEL
BUN: 18 mg/dL (ref 6–23)
CO2: 26 mEq/L (ref 19–32)
Calcium: 9.4 mg/dL (ref 8.4–10.5)
Chloride: 100 mEq/L (ref 96–112)
Creatinine, Ser: 0.95 mg/dL (ref 0.40–1.50)
GFR: 76.16 mL/min (ref >60.00)
Glucose, Bld: 172 mg/dL — ABNORMAL HIGH (ref 70–99)
Potassium: 4.1 mEq/L (ref 3.5–5.1)
Sodium: 136 mEq/L (ref 135–145)

## 2019-05-23 LAB — CBC
HCT: 36.3 % — ABNORMAL LOW (ref 39.0–52.0)
Hemoglobin: 12.3 g/dL — ABNORMAL LOW (ref 13.0–17.0)
MCHC: 33.9 g/dL (ref 30.0–36.0)
MCV: 94.6 fl (ref 78.0–100.0)
Platelets: 321 10*3/uL (ref 150.0–400.0)
RBC: 3.84 Mil/uL — ABNORMAL LOW (ref 4.22–5.81)
RDW: 13.4 % (ref 11.5–15.5)
WBC: 7.2 10*3/uL (ref 4.0–10.5)

## 2019-05-23 NOTE — Assessment & Plan Note (Signed)
Left abdominal pain: As described above, DDx includes MSK problem, diverticulitis versus others. Never had abdominal surgery, colonoscopy 2018 showed polyps and diverticuli. Udip negative Discussed with patient options: Empirical treatment for presumed diverticulitis versus abdomen pelvis CT with contrast, we both agree to treat first. Plan: UA, urine culture, CBC, BMP. Augmentin Further evaluation (CT) if not better or if he has another episode of  left abdominal pain.

## 2019-05-24 LAB — URINE CULTURE
MICRO NUMBER:: 1236806
Result:: NO GROWTH
SPECIMEN QUALITY:: ADEQUATE

## 2019-06-02 ENCOUNTER — Other Ambulatory Visit: Payer: Self-pay | Admitting: Internal Medicine

## 2019-06-04 ENCOUNTER — Other Ambulatory Visit: Payer: Self-pay | Admitting: Internal Medicine

## 2019-06-05 ENCOUNTER — Ambulatory Visit: Payer: Medicare Other | Attending: Internal Medicine

## 2019-06-05 DIAGNOSIS — Z23 Encounter for immunization: Secondary | ICD-10-CM | POA: Insufficient documentation

## 2019-06-05 NOTE — Progress Notes (Signed)
   Covid-19 Vaccination Clinic  Name:  Chase Martinez    MRN: [id number] DOB: 12/01/1940  06/05/2019  Mr. [name] was observed post Covid-19 immunization for 15 minutes without incidence. He was provided with Vaccine Information Sheet and instruction to access the V-Safe system.   Mr. [name] was instructed to call 911 with any severe reactions post vaccine: Marland Kitchen Difficulty breathing  . Swelling of your face and throat  . A fast heartbeat  . A bad rash all over your body  . Dizziness and weakness    Immunizations Administered    Name Date Dose VIS Date Route   Pfizer COVID-19 Vaccine 06/05/2019 10:38 AM 0.3 mL 05/04/2019 Intramuscular   Manufacturer: [employer], [employer]   Lot: [id number]   [place]: [id number]

## 2019-06-06 ENCOUNTER — Ambulatory Visit: Payer: Medicare Other

## 2019-06-12 ENCOUNTER — Ambulatory Visit: Payer: Medicare Other | Admitting: Internal Medicine

## 2019-06-25 ENCOUNTER — Ambulatory Visit: Payer: Medicare Other | Attending: Internal Medicine

## 2019-06-25 DIAGNOSIS — Z23 Encounter for immunization: Secondary | ICD-10-CM | POA: Insufficient documentation

## 2019-06-25 NOTE — Progress Notes (Signed)
   Covid-19 Vaccination Clinic  Name:  Chase Martinez    MRN: [id number] DOB: [birth year]  06/25/2019  [name] was observed post Covid-19 immunization for 15 minutes without incidence. He was provided with Vaccine Information Sheet and instruction to access the V-Safe system.   [name] was instructed to call 911 with any severe reactions post vaccine: Marland Kitchen Difficulty breathing  . Swelling of your face and throat  . A fast heartbeat  . A bad rash all over your body  . Dizziness and weakness    Immunizations Administered    Name Date Dose VIS Date Route   Pfizer COVID-19 Vaccine 06/25/2019  9:16 AM 0.3 mL 05/04/2019 Intramuscular   Manufacturer: [place]   Lot: [id number]   [place]: [id number]

## 2019-07-03 DIAGNOSIS — E1351 Other specified diabetes mellitus with diabetic peripheral angiopathy without gangrene: Secondary | ICD-10-CM | POA: Diagnosis not present

## 2019-07-03 DIAGNOSIS — B351 Tinea unguium: Secondary | ICD-10-CM | POA: Diagnosis not present

## 2019-07-03 DIAGNOSIS — L84 Corns and callosities: Secondary | ICD-10-CM | POA: Diagnosis not present

## 2019-07-04 NOTE — Progress Notes (Signed)
Cardiology Office Note:    Date:  07/05/2019   ID:  Chase Martinez, DOB 1941-01-19, MRN [id number]  PCP:  Colon Branch, MD  Cardiologist:  Fransico Him, MD    Referring MD: Colon Branch, MD   Chief Complaint  Patient presents with  . Follow-up    PACS, HTN    History of Present Illness:    Chase Martinez is a 79 y.o. male with a hx of  PAC's and HTN.He is here today for followup and is doing well.  He denies any chest pain or pressure, SOB, DOE, PND, orthopnea, LE edema, dizziness, palpitations or syncope. He is compliant with his meds and is tolerating meds with no SE.    Past Medical History:  Diagnosis Date  . Basal cell carcinoma    dr Ronnald Ramp  . Diabetes mellitus   . Glaucoma   . Hyperlipidemia   . Hypertension   . 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 Premature atrial contractions     Past Surgical History:  Procedure Laterality Date  . CATARACT EXTRACTION Bilateral 2015  . PILONIDAL CYST EXCISION    . REFRACTIVE SURGERY    . SHOULDER SURGERY     right  . TONSILLECTOMY      Current Medications: Current Meds  Medication Sig  . Ascorbic Acid (VITAMIN C) 1000 MG tablet Take 1 tablet by mouth daily.  Marland Kitchen aspirin 81 MG tablet Take 81 mg by mouth daily.    Marland Kitchen atorvastatin (LIPITOR) 20 MG tablet TAKE 1 TABLET DAILY  . Cholecalciferol (VITAMIN D3) 50 MCG (2000 UT) capsule Take 1 capsule by mouth daily.  Marland Kitchen diltiazem (CARDIZEM LA) 240 MG 24 hr tablet TAKE 1 TABLET DAILY  . empagliflozin (JARDIANCE) 10 MG TABS tablet Take 10 mg by mouth daily before breakfast.  . FREESTYLE LITE test strip USE TO CHECK BLOOD SUGAR NOT MORE THAN TWICE A DAY  . furosemide (LASIX) 20 MG tablet Take 1 tablet (20 mg total) by mouth daily.  Marland Kitchen JANUVIA 100 MG tablet TAKE 1 TABLET DAILY  . Lancets (FREESTYLE) lancets USE AS DIRECTED TO CHECK BLOOD SUGARS 3 TO 4 TIMES DAILY (Patient taking differently: 1 each by Other route 2 (two) times daily.  )  . metFORMIN (GLUCOPHAGE) 1000 MG tablet Take 1 tablet (1,000 mg total) by mouth 2 (two) times daily with a meal.  . metoprolol succinate (TOPROL XL) 100 MG 24 hr tablet Take 1 tablet (100 mg total) by mouth daily. Take with or immediately following a meal.  . multivitamin (THERAGRAN) per tablet Take 1 tablet by mouth daily.    . Omega-3 Fatty Acids (FISH OIL) 1000 MG CAPS Take 1 capsule by mouth daily.   . tamsulosin (FLOMAX) 0.4 MG CAPS capsule Take 1 capsule (0.4 mg total) by mouth daily after supper.     Allergies:   Patient has no known allergies.   Social History   Socioeconomic History  . Marital status: Married    Spouse name: Not on file  . Number of children: 2  . Years of education: Not on file  . Highest education level: Not on file  Occupational History  . Occupation: Retired Therapist, art Engineering geologist)  Tobacco Use  . Smoking status: Former Smoker    Packs/day: 1.00    Types: Cigarettes, Cigars    Quit date: 05/24/1966    Years since quitting: 53.1  . Smokeless tobacco: Former Systems developer    Types:  Snuff    Quit date: 05/24/1988  Substance and Sexual Activity  . Alcohol use: Yes    Alcohol/week: 7.0 - 14.0 standard drinks    Types: 7 - 14 Glasses of wine per week    Comment: occasional beer and wine  . Drug use: No  . Sexual activity: Yes  Other Topics Concern  . Not on file  Social History Narrative   Household-- pt and wife    Son lives near by, daughter near [place] Strain:   . Difficulty of Paying Living Expenses: Not on file  Food Insecurity:   . Worried About Charity fundraiser in the Last Year: Not on file  . Ran Out of Food in the Last Year: Not on file  Transportation Needs:   . Lack of Transportation (Medical): Not on file  . Lack of Transportation (Non-Medical): Not on file  Physical Activity:   . Days of Exercise per Week: Not on file  . Minutes of Exercise per Session: Not on file  Stress:   .  Feeling of Stress : Not on file  Social Connections:   . Frequency of Communication with Friends and Family: Not on file  . Frequency of Social Gatherings with Friends and Family: Not on file  . Attends Religious Services: Not on file  . Active Member of Clubs or Organizations: Not on file  . Attends Archivist Meetings: Not on file  . Marital Status: Not on file     Family History: The patient's family history includes Dementia in his father; Glaucoma in his mother; Hypertension in his mother; Parkinsonism in his sister; Thyroid cancer in his son. There is no history of Coronary artery disease, Diabetes, Stroke, Prostate cancer, Colon cancer, Esophageal cancer, or Stomach cancer.  ROS:   Please see the history of present illness.    ROS  All other systems reviewed and negative.   EKGs/Labs/Other Studies Reviewed:    The following studies were reviewed today: Labs from PCP  EKG:  EKG is  ordered today.  The ekg ordered today demonstrates NSR at 60bpm with no ST changes  Recent Labs: 12/07/2018: ALT 21 05/22/2019: BUN 18; Creatinine, Ser 0.95; Hemoglobin 12.3; Platelets 321.0; Potassium 4.1; Sodium 136   Recent Lipid Panel    Component Value Date/Time   CHOL 102 12/07/2018 0948   TRIG 128.0 12/07/2018 0948   TRIG 74 05/31/2006 0933   HDL 35.00 (L) 12/07/2018 0948   CHOLHDL 3 12/07/2018 0948   VLDL 25.6 12/07/2018 0948   LDLCALC 41 12/07/2018 0948    Physical Exam:    VS:  BP 118/60   Pulse 60   Ht 6' (1.829 m)   Wt 205 lb 3.2 oz (93.1 kg)   BMI 27.83 kg/m     Wt Readings from Last 3 Encounters:  07/05/19 205 lb 3.2 oz (93.1 kg)  05/22/19 202 lb 9.6 oz (91.9 kg)  03/13/19 216 lb 6 oz (98.1 kg)     GEN:  Well nourished, well developed in no acute distress HEENT: Normal NECK: No JVD; No carotid bruits LYMPHATICS: No lymphadenopathy CARDIAC: RRR, no murmurs, rubs, gallops RESPIRATORY:  Clear to auscultation without rales, wheezing or rhonchi  ABDOMEN:  Soft, non-tender, non-distended MUSCULOSKELETAL:  No edema; No deformity  SKIN: Warm and dry NEUROLOGIC:  Alert and oriented x 3 PSYCHIATRIC:  Normal affect   ASSESSMENT:    1. Premature atrial contractions  2. Essential hypertension   3. Pure hypercholesterolemia    PLAN:    In order of problems listed above:  1.  PACS -denies any palpitations -continue Toprol and CCB  2.  HTN -BP controlled -continue Toprol XL 100mg  daily and Cardizem CD 240mg  daily  3.  HLD -LDL goal < 70 -LDL was 41 in July 2020 -continue Atorvastatin 20mg  daily  4.  DM -followed by PCP -continue Metformin 1000mg  BID -HbA1C 7.1%   Medication Adjustments/Labs and Tests Ordered: Current medicines are reviewed at length with the patient today.  Concerns regarding medicines are outlined above.  Orders Placed This Encounter  Procedures  . EKG 12-Lead   No orders of the defined types were placed in this encounter.   Signed, Fransico Him, MD  07/05/2019 9:10 AM    [place]

## 2019-07-05 ENCOUNTER — Other Ambulatory Visit: Payer: Self-pay

## 2019-07-05 ENCOUNTER — Ambulatory Visit (INDEPENDENT_AMBULATORY_CARE_PROVIDER_SITE_OTHER): Payer: Medicare Other | Admitting: Cardiology

## 2019-07-05 ENCOUNTER — Encounter: Payer: Self-pay | Admitting: Cardiology

## 2019-07-05 VITALS — BP 118/60 | HR 60 | Ht 72.0 in | Wt 205.2 lb

## 2019-07-05 DIAGNOSIS — E119 Type 2 diabetes mellitus without complications: Secondary | ICD-10-CM

## 2019-07-05 DIAGNOSIS — E7800 Pure hypercholesterolemia, unspecified: Secondary | ICD-10-CM

## 2019-07-05 DIAGNOSIS — I491 Atrial premature depolarization: Secondary | ICD-10-CM | POA: Diagnosis not present

## 2019-07-05 DIAGNOSIS — I10 Essential (primary) hypertension: Secondary | ICD-10-CM

## 2019-07-05 NOTE — Patient Instructions (Signed)

## 2019-07-07 ENCOUNTER — Other Ambulatory Visit: Payer: Self-pay | Admitting: Cardiology

## 2019-07-17 ENCOUNTER — Other Ambulatory Visit: Payer: Self-pay

## 2019-07-17 ENCOUNTER — Ambulatory Visit (INDEPENDENT_AMBULATORY_CARE_PROVIDER_SITE_OTHER): Payer: Medicare Other | Admitting: Internal Medicine

## 2019-07-17 ENCOUNTER — Other Ambulatory Visit: Payer: Self-pay | Admitting: Internal Medicine

## 2019-07-17 ENCOUNTER — Encounter: Payer: Self-pay | Admitting: Internal Medicine

## 2019-07-17 VITALS — BP 128/58 | HR 59 | Temp 96.0°F | Resp 16 | Ht 72.0 in | Wt 205.2 lb

## 2019-07-17 DIAGNOSIS — E1140 Type 2 diabetes mellitus with diabetic neuropathy, unspecified: Secondary | ICD-10-CM | POA: Diagnosis not present

## 2019-07-17 DIAGNOSIS — I10 Essential (primary) hypertension: Secondary | ICD-10-CM

## 2019-07-17 LAB — HEMOGLOBIN A1C: Hgb A1c MFr Bld: 7.6 % — ABNORMAL HIGH (ref 4.6–6.5)

## 2019-07-17 NOTE — Progress Notes (Signed)
Subjective:    Patient ID: Chase Martinez, male    DOB: 01/27/1941, 79 y.o.   MRN: [id number]  DOS:  07/17/2019 Type of visit - description: rov Today we address all his chronic medical problems. Was seen with diverticulitis: Symptoms quickly resolved   Review of Systems Denies chest pain or difficulty breathing No nausea, vomiting, diarrhea  Past Medical History:  Diagnosis Date  . Basal cell carcinoma    dr Ronnald Ramp  . Diabetes mellitus   . Glaucoma   . Hyperlipidemia   . Hypertension   . 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 Premature atrial contractions     Past Surgical History:  Procedure Laterality Date  . CATARACT EXTRACTION Bilateral 2015  . PILONIDAL CYST EXCISION    . REFRACTIVE SURGERY    . SHOULDER SURGERY     right  . TONSILLECTOMY      Allergies as of 07/17/2019   No Known Allergies     Medication List       Accurate as of July 17, 2019 11:59 PM. If you have any questions, ask your nurse or doctor.        aspirin 81 MG tablet Take 81 mg by mouth daily.   atorvastatin 20 MG tablet Commonly known as: LIPITOR TAKE 1 TABLET DAILY   diltiazem 240 MG 24 hr tablet Commonly known as: CARDIZEM LA TAKE 1 TABLET DAILY   Fish Oil 1000 MG Caps Take 1 capsule by mouth daily.   freestyle lancets USE AS DIRECTED TO CHECK BLOOD SUGARS 3 TO 4 TIMES DAILY What changed: See the new instructions.   FREESTYLE LITE test strip Generic drug: glucose blood USE TO CHECK BLOOD SUGAR NOT MORE THAN TWICE A DAY   furosemide 20 MG tablet Commonly known as: LASIX Take 1 tablet (20 mg total) by mouth daily.   Januvia 100 MG tablet Generic drug: sitaGLIPtin TAKE 1 TABLET DAILY   Jardiance 10 MG Tabs tablet Generic drug: empagliflozin Take 10 mg by mouth daily before breakfast.   metFORMIN 1000 MG tablet Commonly known as: GLUCOPHAGE Take 1 tablet (1,000 mg total) by mouth 2 (two) times daily with a  meal.   metoprolol succinate 100 MG 24 hr tablet Commonly known as: Toprol XL Take 1 tablet (100 mg total) by mouth daily. Take with or immediately following a meal.   multivitamin per tablet Take 1 tablet by mouth daily.   tamsulosin 0.4 MG Caps capsule Commonly known as: FLOMAX Take 1 capsule (0.4 mg total) by mouth daily after supper.   vitamin C 1000 MG tablet Take 1 tablet by mouth daily.   Vitamin D3 50 MCG (2000 UT) capsule Take 1 capsule by mouth daily.         Objective:   Physical Exam BP (!) 128/58 (BP Location: Left Arm, Patient Position: Sitting, Cuff Size: Normal)   Pulse (!) 59   Temp (!) 96 F (35.6 C) (Temporal)   Resp 16   Ht 6' (1.829 m)   Wt 205 lb 4 oz (93.1 kg)   SpO2 99%   BMI 27.84 kg/m  General:   Well developed, NAD, BMI noted. HEENT:  Normocephalic . Face symmetric, atraumatic Lungs:  CTA B Normal respiratory effort, no intercostal retractions, no accessory muscle use. Heart: RRR,  no murmur.  Lower extremities: no pretibial edema bilaterally  Skin: Not pale. Not jaundice Neurologic:  alert & oriented X3.  Speech normal, gait appropriate  for age and unassisted Psych--  Cognition and judgment appear intact.  Cooperative with normal attention span and concentration.  Behavior appropriate. No anxious or depressed appearing.      Assessment      Assessment: DM--  + neuropathy HTN Hyperlipidemia PVCs  -- DR Thomasena Edis 03-2015, on CCB,BB BPH BCC Dr. Ronnald Ramp Nail dystrophy  Asbestosis dx 2019  PLAN: DM: Ambulatory CBGs in the morning fasting average 133, continue Jardiance, Januvia, Metformin.  Check A1c. HTN: Ambulatory BPs in the 120s/60s.  Last BMP satisfactory.  Continue Cardizem and metoprolol. PVCs: Saw cardiology, 07/05/2019, no changes made. Diverticulitis: See last visit, symptoms quickly resolved with antibiotics. Preventive care: S/p Covid immunization x2 RTC 6 months routine checkup.   This visit occurred during  the SARS-CoV-2 public health emergency.  Safety protocols were in place, including screening questions prior to the visit, additional usage of staff PPE, and extensive cleaning of exam room while observing appropriate contact time as indicated for disinfecting solutions.

## 2019-07-17 NOTE — Progress Notes (Signed)
Pre visit review using our clinic review tool, if applicable. No additional management support is needed unless otherwise documented below in the visit note. 

## 2019-07-17 NOTE — Patient Instructions (Signed)
GO TO THE LAB : Get the blood work     GO TO THE FRONT DESK Come back for a checkup in 6 months, please make an appointment     

## 2019-07-18 NOTE — Assessment & Plan Note (Signed)
DM: Ambulatory CBGs in the morning fasting average 133, continue Jardiance, Januvia, Metformin.  Check A1c. HTN: Ambulatory BPs in the 120s/60s.  Last BMP satisfactory.  Continue Cardizem and metoprolol. PVCs: Saw cardiology, 07/05/2019, no changes made. Diverticulitis: See last visit, symptoms quickly resolved with antibiotics. Preventive care: S/p Covid immunization x2 RTC 6 months routine checkup.

## 2019-07-19 MED ORDER — PIOGLITAZONE HCL 30 MG PO TABS: 30.0000 mg | ORAL_TABLET | Freq: Every day | ORAL | 1 refills | Status: DC

## 2019-07-19 NOTE — Addendum Note (Signed)
Addended byDamita Dunnings D on: 07/19/2019 03:00 PM   Modules accepted: Orders

## 2019-07-31 DIAGNOSIS — H401231 Low-tension glaucoma, bilateral, mild stage: Secondary | ICD-10-CM | POA: Diagnosis not present

## 2019-07-31 DIAGNOSIS — E119 Type 2 diabetes mellitus without complications: Secondary | ICD-10-CM | POA: Diagnosis not present

## 2019-07-31 DIAGNOSIS — Z961 Presence of intraocular lens: Secondary | ICD-10-CM | POA: Diagnosis not present

## 2019-07-31 LAB — HM DIABETES EYE EXAM

## 2019-08-06 ENCOUNTER — Encounter: Payer: Self-pay | Admitting: Internal Medicine

## 2019-08-12 ENCOUNTER — Other Ambulatory Visit: Payer: Self-pay | Admitting: Internal Medicine

## 2019-08-28 ENCOUNTER — Other Ambulatory Visit: Payer: Self-pay | Admitting: Internal Medicine

## 2019-09-04 DIAGNOSIS — D225 Melanocytic nevi of trunk: Secondary | ICD-10-CM | POA: Diagnosis not present

## 2019-09-04 DIAGNOSIS — D2272 Melanocytic nevi of left lower limb, including hip: Secondary | ICD-10-CM | POA: Diagnosis not present

## 2019-09-04 DIAGNOSIS — Z85828 Personal history of other malignant neoplasm of skin: Secondary | ICD-10-CM | POA: Diagnosis not present

## 2019-09-04 DIAGNOSIS — L723 Sebaceous cyst: Secondary | ICD-10-CM | POA: Diagnosis not present

## 2019-09-04 DIAGNOSIS — L570 Actinic keratosis: Secondary | ICD-10-CM | POA: Diagnosis not present

## 2019-09-07 ENCOUNTER — Telehealth (INDEPENDENT_AMBULATORY_CARE_PROVIDER_SITE_OTHER): Payer: Medicare Other | Admitting: Medical

## 2019-09-07 ENCOUNTER — Other Ambulatory Visit: Payer: Self-pay

## 2019-09-07 DIAGNOSIS — B029 Zoster without complications: Secondary | ICD-10-CM | POA: Diagnosis not present

## 2019-09-07 MED ORDER — FAMCICLOVIR 500 MG PO TABS: 500.0000 mg | ORAL_TABLET | Freq: Three times a day (TID) | ORAL | 0 refills | Status: DC

## 2019-09-07 NOTE — Patient Instructions (Addendum)
You have shingles type eruption seen by video.  Some disadvantage doing video visits and not cannot see fine detail.  I do think you should start Famvir 5 mg 3 times daily today.  If you have any itching to the area you can use topical hydrocortisone or Benadryl gel.  Would like you to update me on how area is doing on Monday.  Want an update to assess if you need to change treatment plan some such as giving antihistamine for itching or possible medication for pain.  However presently not giving those medications due to possible side effects.  Follow-up on Monday by call update or as needed.  Depending on how he is doing might need to follow-up in 5 to 7 days in person.  Sooner if needed as well.

## 2019-09-07 NOTE — Progress Notes (Signed)
   Subjective:    Patient ID: Chase Martinez, male    DOB: 1941-05-16, 79 y.o.   MRN: [id number]  HPI  Pt has rash near his belt line. On Wednesday he got rash that is beginning ro radiates. Started toward wrap around in dermatomal pattern. Mild itch. Faint rough portion. Slight tenderness and slight burning. One are feel bumpy. Not real warm to touch. Pt has gotten shingles vaccine in the past years ago.   Review of Systems See hpi.    Objective:   Physical Exam  General-no acute distress, pleasant, oriented. Lungs- on inspection lungs appear unlabored. Neck- no tracheal deviation or jvd on inspection. Neuro- gross motor function appears intact. Derm- faint mild pink rash on video. Distribution in dermatomal pattern. Can't see fine details      Assessment & Plan:  You have shingles type eruption seen by video.  Some disadvantage doing video visits and not cannot see fine detail.  I do think you should start Famvir 5 mg 3 times daily today.  If you have any itching to the area you can use topical hydrocortisone or Benadryl gel.  Would like you to update me on how area is doing on Monday.  Want an update to assess if you need to change treatment plan some such as giving antihistamine for itching or possible medication for pain.  However presently not giving those medications due to possible side effects.  Follow-up on Monday by call update or as needed.  Depending on how he is doing might need to follow-up in 5 to 7 days in person.  Sooner if needed as well.  Time spent with patient today was 20  minutes which consisted of  discussing diagnosis, treatment and documentation.

## 2019-09-10 ENCOUNTER — Telehealth: Payer: Self-pay | Admitting: Internal Medicine

## 2019-09-10 NOTE — Telephone Encounter (Signed)
Glad he is doing better. I wrote him famvir. Not sure what is Terrasil? Would you clarify with him about this med?

## 2019-09-10 NOTE — Telephone Encounter (Signed)
Pt states that he began taking Terrasil and it worked out for him .Minor/bearable  pain or itching  At night.

## 2019-09-11 ENCOUNTER — Telehealth: Payer: Self-pay | Admitting: Internal Medicine

## 2019-09-11 DIAGNOSIS — L84 Corns and callosities: Secondary | ICD-10-CM | POA: Diagnosis not present

## 2019-09-11 DIAGNOSIS — E1151 Type 2 diabetes mellitus with diabetic peripheral angiopathy without gangrene: Secondary | ICD-10-CM | POA: Diagnosis not present

## 2019-09-11 DIAGNOSIS — B351 Tinea unguium: Secondary | ICD-10-CM | POA: Diagnosis not present

## 2019-09-11 NOTE — Telephone Encounter (Signed)
Error

## 2019-09-11 NOTE — Telephone Encounter (Signed)
Patient states he is still having itching and burning but no pain & he is taking famcir 500 mg TID & also using Terrasil a shingles cream he found out about from [facility].

## 2019-09-11 NOTE — Telephone Encounter (Signed)
Called pt and left message with wife to call us back

## 2019-09-12 MED ORDER — CAPSAICIN 0.075 % EX CREA: 1.0000 "application " | TOPICAL_CREAM | Freq: Two times a day (BID) | CUTANEOUS | 1 refills | Status: DC

## 2019-09-12 NOTE — Telephone Encounter (Signed)
For burning and itching you can use zostrix topical cream. I sent in prescription to your pharmacy. I think this is sold over the counter as well. You might get better price as prescription.  Also if rash still present by end of antiviral treatment would ask that you make office or virtual video visit. Sometimes we extend 2nd round of antiviral.  If you get zostrix then stop other cream.  Hope your feeling better each day.

## 2019-09-12 NOTE — Telephone Encounter (Signed)
Patient notified of note. 

## 2019-09-17 ENCOUNTER — Other Ambulatory Visit: Payer: Self-pay

## 2019-09-18 ENCOUNTER — Encounter: Payer: Self-pay | Admitting: Internal Medicine

## 2019-09-18 ENCOUNTER — Ambulatory Visit (INDEPENDENT_AMBULATORY_CARE_PROVIDER_SITE_OTHER): Payer: Medicare Other | Admitting: Internal Medicine

## 2019-09-18 VITALS — BP 130/75 | HR 60 | Temp 96.6°F | Resp 18 | Ht 72.0 in | Wt 194.5 lb

## 2019-09-18 DIAGNOSIS — R21 Rash and other nonspecific skin eruption: Secondary | ICD-10-CM

## 2019-09-18 MED ORDER — CEFUROXIME AXETIL 500 MG PO TABS: 500.0000 mg | ORAL_TABLET | Freq: Two times a day (BID) | ORAL | 0 refills | Status: DC

## 2019-09-18 MED ORDER — TRIAMCINOLONE ACETONIDE 0.1 % EX LOTN: 1.0000 "application " | TOPICAL_LOTION | Freq: Three times a day (TID) | CUTANEOUS | 0 refills | Status: DC

## 2019-09-18 NOTE — Progress Notes (Signed)
Subjective:    Patient ID: Chase Martinez, male    DOB: 03-23-1941, 79 y.o.   MRN: [id number]  DOS:  09/18/2019 Type of visit - description: Follow-up Symptoms started around 09/05/2019, seen 2 days later virtually, diagnosed with shingles, prescribed famciclovir.  Since then, the rash looks about the same.  On exam today it looks like eczema or contact dermatitis, he has [place]  frequently in the last few weeks . Does not recall specifically any poison ivy contact. No tick bite that he can tell.  Denies any fever chills No nausea or vomiting At no point had blisters in the area or pain. He is itching at the rash.     Review of Systems See above   Past Medical History:  Diagnosis Date  . Basal cell carcinoma    dr Ronnald Ramp  . Diabetes mellitus   . Glaucoma   . Hyperlipidemia   . Hypertension   . 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 Premature atrial contractions     Past Surgical History:  Procedure Laterality Date  . CATARACT EXTRACTION Bilateral 2015  . PILONIDAL CYST EXCISION    . REFRACTIVE SURGERY    . SHOULDER SURGERY     right  . TONSILLECTOMY      Allergies as of 09/18/2019   No Known Allergies     Medication List       Accurate as of September 18, 2019  1:40 PM. If you have any questions, ask your nurse or doctor.        aspirin 81 MG tablet Take 81 mg by mouth daily.   atorvastatin 20 MG tablet Commonly known as: LIPITOR TAKE 1 TABLET DAILY   capsicum 0.075 % topical cream Commonly known as: ZOSTRIX Apply 1 application topically 2 (two) times daily.   diltiazem 240 MG 24 hr tablet Commonly known as: CARDIZEM LA TAKE 1 TABLET DAILY   famciclovir 500 MG tablet Commonly known as: FAMVIR Take 1 tablet (500 mg total) by mouth 3 (three) times daily.   Fish Oil 1000 MG Caps Take 1 capsule by mouth daily.   freestyle lancets USE AS DIRECTED TO CHECK BLOOD SUGARS 3  TO 4 TIMES DAILY What changed: See the new instructions.   FREESTYLE LITE test strip Generic drug: glucose blood USE TO CHECK BLOOD SUGAR NOT MORE THAN TWICE A DAY   furosemide 20 MG tablet Commonly known as: LASIX Take 1 tablet (20 mg total) by mouth daily.   Januvia 100 MG tablet Generic drug: sitaGLIPtin TAKE 1 TABLET DAILY   Jardiance 10 MG Tabs tablet Generic drug: empagliflozin Take 10 mg by mouth daily before breakfast.   metFORMIN 1000 MG tablet Commonly known as: GLUCOPHAGE Take 1 tablet (1,000 mg total) by mouth 2 (two) times daily with a meal.   metoprolol succinate 100 MG 24 hr tablet Commonly known as: Toprol XL Take 1 tablet (100 mg total) by mouth daily. Take with or immediately following a meal.   multivitamin per tablet Take 1 tablet by mouth daily.   pioglitazone 30 MG tablet Commonly known as: Actos Take 1 tablet (30 mg total) by mouth daily.   tamsulosin 0.4 MG Caps capsule Commonly known as: FLOMAX Take 1 capsule (0.4 mg total) by mouth daily after supper.   vitamin C 1000 MG tablet Take 1 tablet by mouth daily.   Vitamin D3 50 MCG (2000 UT) capsule Take  1 capsule by mouth daily.          Objective:   Physical Exam BP 130/75 (BP Location: Left Arm, Patient Position: Sitting, Cuff Size: Normal)   Pulse 60   Temp (!) 96.6 F (35.9 C) (Temporal)   Resp 18   Ht 6' (1.829 m)   Wt 194 lb 8 oz (88.2 kg)   SpO2 97%   BMI 26.38 kg/m   General:   Well developed, NAD, BMI noted. HEENT:  Normocephalic . Face symmetric, atraumatic Skin: Has erythema at the right lower abdomen/groin, a much smaller area at the left groin and a small spot of the left forearm. There is not tender, no blisters, definitely dry, no abscess that I can tell, it is a slightly warm. Neurologic:  alert & oriented X3.  Speech normal, gait appropriate for age and unassisted Psych--  Cognition and judgment appear intact.  Cooperative with normal attention span and  concentration.  Behavior appropriate. No anxious or depressed appearing.           Assessment        Assessment: DM--  + neuropathy HTN Hyperlipidemia PVCs  -- DR Thomasena Edis 03-2015, on CCB,BB BPH BCC Dr. Ronnald Ramp Nail dystrophy  Asbestosis dx 2019  PLAN: Rash: Itchy rash as described above, suspect contact dermatitis rather than shingles. He already finished famciclovir and he is not improving. Area feels a slightly warm, early cellulitis?. Plan: Topical steroids, cefuroxime for 5 days, call if no better.  This visit occurred during the SARS-CoV-2 public health emergency.  Safety protocols were in place, including screening questions prior to the visit, additional usage of staff PPE, and extensive cleaning of exam room while observing appropriate contact time as indicated for disinfecting solutions.

## 2019-09-18 NOTE — Patient Instructions (Signed)
Apply the lotion 3 times a day for the next few days  Take the antibiotic cefuroxime twice a day for 5 days  Call if not gradually better

## 2019-09-18 NOTE — Progress Notes (Signed)
Pre visit review using our clinic review tool, if applicable. No additional management support is needed unless otherwise documented below in the visit note. 

## 2019-09-19 NOTE — Assessment & Plan Note (Signed)
Rash: Itchy rash as described above, suspect contact dermatitis rather than shingles. He already finished famciclovir and he is not improving. Area feels a slightly warm, early cellulitis?. Plan: Topical steroids, cefuroxime for 5 days, call if no better.

## 2019-10-12 ENCOUNTER — Other Ambulatory Visit: Payer: Self-pay | Admitting: Internal Medicine

## 2019-10-16 ENCOUNTER — Ambulatory Visit (INDEPENDENT_AMBULATORY_CARE_PROVIDER_SITE_OTHER): Payer: Medicare Other | Admitting: Internal Medicine

## 2019-10-16 ENCOUNTER — Encounter: Payer: Self-pay | Admitting: Internal Medicine

## 2019-10-16 ENCOUNTER — Other Ambulatory Visit: Payer: Self-pay

## 2019-10-16 VITALS — BP 119/60 | HR 58 | Temp 96.3°F | Resp 18 | Ht 72.0 in | Wt 189.4 lb

## 2019-10-16 DIAGNOSIS — E1140 Type 2 diabetes mellitus with diabetic neuropathy, unspecified: Secondary | ICD-10-CM

## 2019-10-16 DIAGNOSIS — I10 Essential (primary) hypertension: Secondary | ICD-10-CM

## 2019-10-16 LAB — BASIC METABOLIC PANEL
BUN: 24 mg/dL — ABNORMAL HIGH (ref 6–23)
CO2: 27 mEq/L (ref 19–32)
Calcium: 9.4 mg/dL (ref 8.4–10.5)
Chloride: 101 mEq/L (ref 96–112)
Creatinine, Ser: 0.98 mg/dL (ref 0.40–1.50)
GFR: 73.41 mL/min (ref >60.00)
Glucose, Bld: 136 mg/dL — ABNORMAL HIGH (ref 70–99)
Potassium: 4.1 mEq/L (ref 3.5–5.1)
Sodium: 135 mEq/L (ref 135–145)

## 2019-10-16 LAB — HEMOGLOBIN A1C: Hgb A1c MFr Bld: 6.6 % — ABNORMAL HIGH (ref 4.6–6.5)

## 2019-10-16 NOTE — Patient Instructions (Addendum)
Happy belated Rudene Anda!    GO TO THE LAB : Get the blood work     GO TO THE FRONT DESK, PLEASE SCHEDULE YOUR APPOINTMENTS Come back for  A check up in 4 months

## 2019-10-16 NOTE — Assessment & Plan Note (Signed)
DM: A1c 3 months ago was 7.6, he was taking Jardiance, Metformin, Januvia. Actos was added however due to a misunderstanding he also stopped Jardiance. Since then, he is eating healthier, reports no side effects from Actos his CBGs actually have improved compared to previous months, on average they are 105. Plan: Check A1c, restart Jardiance only if needed. HTN: Ambulatory BPs excellent, continue Cardizem, Lasix, Toprol, check a BMP Rash: See last visit, quickly resolved Aspirin: No history of CAD or stroke, okay to stop it. RTC 4 months

## 2019-10-16 NOTE — Progress Notes (Signed)
Subjective:    Patient ID: Chase Martinez, male    DOB: 1940-09-27, 79 y.o.   MRN: [id number]  DOS:  10/16/2019 Type of visit - description: Routine follow-up Today with talk about diabetes, hypertension and rash. He was recently seen with a rash and it quickly resolved  Review of Systems Denies chest pain or difficulty breathing No nausea, vomiting, diarrhea  Past Medical History:  Diagnosis Date  . Basal cell carcinoma    dr Ronnald Ramp  . Diabetes mellitus   . Glaucoma   . Hyperlipidemia   . Hypertension   . 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 Premature atrial contractions     Past Surgical History:  Procedure Laterality Date  . CATARACT EXTRACTION Bilateral 2015  . PILONIDAL CYST EXCISION    . REFRACTIVE SURGERY    . SHOULDER SURGERY     right  . TONSILLECTOMY      Allergies as of 10/16/2019   No Known Allergies     Medication List       Accurate as of Oct 16, 2019  8:10 PM. If you have any questions, ask your nurse or doctor.        STOP taking these medications   aspirin 81 MG tablet Stopped by: Kathlene November, MD   cefUROXime 500 MG tablet Commonly known as: CEFTIN Stopped by: Kathlene November, MD   Jardiance 10 MG Tabs tablet Generic drug: empagliflozin Stopped by: Kathlene November, MD   triamcinolone lotion 0.1 % Commonly known as: KENALOG Stopped by: Kathlene November, MD     TAKE these medications   atorvastatin 20 MG tablet Commonly known as: LIPITOR TAKE 1 TABLET DAILY   diltiazem 240 MG 24 hr tablet Commonly known as: CARDIZEM LA TAKE 1 TABLET DAILY   Fish Oil 1000 MG Caps Take 1 capsule by mouth daily.   freestyle lancets USE AS DIRECTED TO CHECK BLOOD SUGARS 3 TO 4 TIMES DAILY What changed: See the new instructions.   FREESTYLE LITE test strip Generic drug: glucose blood USE TO CHECK BLOOD SUGAR NOT MORE THAN TWICE A DAY   furosemide 20 MG tablet Commonly known as: LASIX Take 1 tablet (20 mg  total) by mouth daily.   Januvia 100 MG tablet Generic drug: sitaGLIPtin TAKE 1 TABLET DAILY   metFORMIN 1000 MG tablet Commonly known as: GLUCOPHAGE Take 1 tablet (1,000 mg total) by mouth 2 (two) times daily with a meal.   metoprolol succinate 100 MG 24 hr tablet Commonly known as: Toprol XL Take 1 tablet (100 mg total) by mouth daily. Take with or immediately following a meal.   multivitamin per tablet Take 1 tablet by mouth daily.   pioglitazone 30 MG tablet Commonly known as: Actos Take 1 tablet (30 mg total) by mouth daily.   tamsulosin 0.4 MG Caps capsule Commonly known as: FLOMAX Take 1 capsule (0.4 mg total) by mouth daily after supper.   vitamin C 1000 MG tablet Take 1 tablet by mouth daily.   Vitamin D3 50 MCG (2000 UT) capsule Take 1 capsule by mouth daily.          Objective:   Physical Exam BP 119/60 (BP Location: Left Arm, Patient Position: Sitting, Cuff Size: Normal)   Pulse (!) 58   Temp (!) 96.3 F (35.7 C) (Temporal)   Resp 18   Ht 6' (1.829 m)   Wt 189 lb 6 oz (85.9 kg)   SpO2 100%  BMI 25.68 kg/m  General:   Well developed, NAD, BMI noted. HEENT:  Normocephalic . Face symmetric, atraumatic Lungs:  CTA B Normal respiratory effort, no intercostal retractions, no accessory muscle use. Heart: RRR,  no murmur.  Lower extremities: no pretibial edema bilaterally  Skin: Not pale. Not jaundice Neurologic:  alert & oriented X3.  Speech normal, gait appropriate for age and unassisted Psych--  Cognition and judgment appear intact.  Cooperative with normal attention span and concentration.  Behavior appropriate. No anxious or depressed appearing.      Assessment    Assessment: DM--  + neuropathy HTN Hyperlipidemia PVCs  -- DR Thomasena Edis 03-2015, on CCB,BB BPH BCC Dr. Ronnald Ramp Nail dystrophy  Asbestosis dx 2019  PLAN: DM: A1c 3 months ago was 7.6, he was taking Jardiance, Metformin, Januvia. Actos was added however due to a  misunderstanding he also stopped Jardiance. Since then, he is eating healthier, reports no side effects from Actos his CBGs actually have improved compared to previous months, on average they are 105. Plan: Check A1c, restart Jardiance only if needed. HTN: Ambulatory BPs excellent, continue Cardizem, Lasix, Toprol, check a BMP Rash: See last visit, quickly resolved Aspirin: No history of CAD or stroke, okay to stop it. RTC 4 months  This visit occurred during the SARS-CoV-2 public health emergency.  Safety protocols were in place, including screening questions prior to the visit, additional usage of staff PPE, and extensive cleaning of exam room while observing appropriate contact time as indicated for disinfecting solutions.

## 2019-10-16 NOTE — Progress Notes (Signed)
Pre visit review using our clinic review tool, if applicable. No additional management support is needed unless otherwise documented below in the visit note. 

## 2019-10-17 ENCOUNTER — Other Ambulatory Visit: Payer: Self-pay | Admitting: Internal Medicine

## 2019-11-05 DIAGNOSIS — L03031 Cellulitis of right toe: Secondary | ICD-10-CM | POA: Diagnosis not present

## 2019-11-05 DIAGNOSIS — M2021 Hallux rigidus, right foot: Secondary | ICD-10-CM | POA: Diagnosis not present

## 2019-11-05 DIAGNOSIS — M205X1 Other deformities of toe(s) (acquired), right foot: Secondary | ICD-10-CM | POA: Diagnosis not present

## 2019-11-05 DIAGNOSIS — E1351 Other specified diabetes mellitus with diabetic peripheral angiopathy without gangrene: Secondary | ICD-10-CM | POA: Diagnosis not present

## 2019-11-15 ENCOUNTER — Other Ambulatory Visit (HOSPITAL_COMMUNITY): Payer: Medicare Other

## 2019-11-19 ENCOUNTER — Ambulatory Visit (INDEPENDENT_AMBULATORY_CARE_PROVIDER_SITE_OTHER): Payer: Medicare Other | Admitting: Pulmonary Disease

## 2019-11-19 ENCOUNTER — Other Ambulatory Visit: Payer: Self-pay

## 2019-11-19 ENCOUNTER — Ambulatory Visit: Payer: Medicare Other | Admitting: Adult Health

## 2019-11-19 ENCOUNTER — Other Ambulatory Visit: Payer: Self-pay | Admitting: Pulmonary Disease

## 2019-11-19 DIAGNOSIS — R0600 Dyspnea, unspecified: Secondary | ICD-10-CM

## 2019-11-19 DIAGNOSIS — R0609 Other forms of dyspnea: Secondary | ICD-10-CM

## 2019-11-19 LAB — PULMONARY FUNCTION TEST
DL/VA % pred: 118 %
DL/VA: 4.54 ml/min/mmHg/L
DLCO cor % pred: 108 %
DLCO cor: 28.93 ml/min/mmHg
DLCO unc % pred: 108 %
DLCO unc: 28.93 ml/min/mmHg
FEF 25-75 Post: 2.72 L/sec
FEF 25-75 Pre: 2.82 L/sec
FEF2575-%Change-Post: -3 %
FEF2575-%Pred-Post: 122 %
FEF2575-%Pred-Pre: 127 %
FEV1-%Change-Post: -1 %
FEV1-%Pred-Post: 108 %
FEV1-%Pred-Pre: 110 %
FEV1-Post: 3.5 L
FEV1-Pre: 3.56 L
FEV1FVC-%Change-Post: 7 %
FEV1FVC-%Pred-Pre: 107 %
FEV6-%Change-Post: -8 %
FEV6-%Pred-Post: 99 %
FEV6-%Pred-Pre: 108 %
FEV6-Post: 4.21 L
FEV6-Pre: 4.59 L
FEV6FVC-%Change-Post: 0 %
FEV6FVC-%Pred-Post: 106 %
FEV6FVC-%Pred-Pre: 106 %
FVC-%Change-Post: -8 %
FVC-%Pred-Post: 93 %
FVC-%Pred-Pre: 102 %
FVC-Post: 4.22 L
FVC-Pre: 4.62 L
Post FEV1/FVC ratio: 83 %
Post FEV6/FVC ratio: 100 %
Pre FEV1/FVC ratio: 77 %
Pre FEV6/FVC Ratio: 99 %
RV % pred: 82 %
RV: 2.33 L
TLC % pred: 87 %
TLC: 6.72 L

## 2019-11-19 NOTE — Progress Notes (Signed)
PFT done today. 

## 2019-11-29 ENCOUNTER — Encounter: Payer: Self-pay | Admitting: Adult Health

## 2019-11-29 ENCOUNTER — Ambulatory Visit (INDEPENDENT_AMBULATORY_CARE_PROVIDER_SITE_OTHER): Payer: Medicare Other | Admitting: Adult Health

## 2019-11-29 ENCOUNTER — Ambulatory Visit (INDEPENDENT_AMBULATORY_CARE_PROVIDER_SITE_OTHER): Payer: Medicare Other

## 2019-11-29 ENCOUNTER — Other Ambulatory Visit: Payer: Self-pay

## 2019-11-29 VITALS — BP 118/68 | HR 83 | Temp 98.2°F | Ht 72.0 in | Wt 195.0 lb

## 2019-11-29 DIAGNOSIS — Z77090 Contact with and (suspected) exposure to asbestos: Secondary | ICD-10-CM

## 2019-11-29 DIAGNOSIS — J61 Pneumoconiosis due to asbestos and other mineral fibers: Secondary | ICD-10-CM | POA: Diagnosis not present

## 2019-11-29 NOTE — Assessment & Plan Note (Signed)
Clinically stable - PFT is normal and stable  Check cxr today   Plan  Patient Instructions  Chest xray today  Activity as tolerated Follow up in 1 year with Dr. Vaughan Browner - Spirometry with DLCO .

## 2019-11-29 NOTE — Progress Notes (Signed)
@Patient  ID: Chase Martinez, male    DOB: 01-17-1941, 79 y.o.   MRN: [id number]  Chief Complaint  Patient presents with   Follow-up    ILD     Referring provider: Colon Branch, MD  HPI: 79 year old male followed for known asbestosis Medical history significant for hypertension, hyperlipidemia and diabetes Previous asbestos exposure was while he was in the [employer].  TEST/EVENTS :  CT chest 07/10/2009- dependent bibasilar atelectasis.  High-resolution CT 10/20/17- minimal septal thickening, subpleural reticulation at the bases.  Mild air trapping.  Tiny calcified granulomas, small pleural plaques Hepatic steatosis, coronary atherosclerosis. I reviewed the images personally.  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  Later worked in Sports coach at [employer] Exposures: Reports exposure to asbestos while in [employer].  No mold, hot tub, Jacuzzi. Smoking history: 20-pack-year smoking history.  Quit in 1968 Travel history: Lived in [place], [place], [place].  Recent travel to [place]. Relevant family history: No significant family history of lung issues.  11/29/2019 Follow up : Asbestosis Patient presents for a follow-up visit.  Patient was last seen June 2019.  Patient is followed for known asbestosis.  Patient says overall is feeling well.  Denies any known breathing issues.  Says he is very active. Plays golf 4-5 week.  Denies any change in his activity level or tolerance. No cough or dyspnea.   PFTs showed stable lung function with FEV1 at 110%, ratio 77  FVC 102%, no significant bronchodilator response, DLCO 108%. This is similar to PFTs in 2019.    No Known Allergies  Immunization History  Administered Date(s) Administered   DTaP 06/21/2011   H1N1 06/11/2008   Hepatitis A  12/23/2015   Influenza Split 02/22/2012   Influenza Whole 04/05/2007, 02/13/2008, 02/18/2009, 01/20/2010   Influenza, High Dose Seasonal PF 04/04/2013, 03/02/2017, 03/09/2018, 01/06/2019   Influenza,inj,Quad PF,6+ Mos 03/07/2014   Influenza-Unspecified 03/06/2015, 02/20/2016   PFIZER SARS-COV-2 Vaccination 06/05/2019, 06/25/2019   Pneumococcal Conjugate-13 10/30/2013   Pneumococcal Polysaccharide-23 05/24/2002, 09/28/2007, 08/05/2016   Td 05/24/2001, 05/21/2013   Typhoid Inactivated 12/23/2015   Yellow Fever 12/23/2015   Zoster 05/24/2002   Zoster Recombinat (Shingrix) 03/21/2018, 05/25/2018    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     atorvastatin (LIPITOR) 20 MG tablet TAKE 1 TABLET DAILY 90 tablet 3   Cholecalciferol (VITAMIN D3) 50 MCG (2000 UT) capsule Take 1 capsule by mouth daily.     diltiazem (CARDIZEM LA) 240 MG 24 hr tablet TAKE 1 TABLET DAILY 90 tablet 3   FREESTYLE LITE test  strip USE TO CHECK BLOOD SUGAR NOT MORE THAN TWICE A DAY 100 each 12   furosemide (LASIX) 20 MG tablet Take 1 tablet (20 mg total) by mouth daily. 90 tablet 1   JANUVIA 100 MG tablet TAKE 1 TABLET DAILY 90 tablet 3   Lancets (FREESTYLE) lancets USE AS DIRECTED TO CHECK BLOOD SUGARS 3 TO 4 TIMES DAILY 200 each 12    metFORMIN (GLUCOPHAGE) 1000 MG tablet Take 1 tablet (1,000 mg total) by mouth 2 (two) times daily with a meal. 180 tablet 0   metoprolol succinate (TOPROL XL) 100 MG 24 hr tablet Take 1 tablet (100 mg total) by mouth daily. Take with or immediately following a meal. 90 tablet 3   multivitamin (THERAGRAN) per tablet Take 1 tablet by mouth daily.       Omega-3 Fatty Acids (FISH OIL) 1000 MG CAPS Take 1 capsule by mouth daily.      pioglitazone (ACTOS) 30 MG tablet Take 1 tablet (30 mg total) by mouth daily. 90 tablet 1   tamsulosin (FLOMAX) 0.4 MG CAPS capsule Take 1 capsule (0.4 mg total) by mouth daily after supper. 90 capsule 3   No facility-administered medications prior to visit.     Review of Systems:   Constitutional:   No  weight loss, night sweats,  Fevers, chills, fatigue, or  lassitude.  HEENT:   No headaches,  Difficulty swallowing,  Tooth/dental problems, or  Sore throat,                No sneezing, itching, ear ache, nasal congestion, post nasal drip,   CV:  No chest pain,  Orthopnea, PND, swelling in lower extremities, anasarca, dizziness, palpitations, syncope.   GI  No heartburn, indigestion, abdominal pain, nausea, vomiting, diarrhea, change in bowel habits, loss of appetite, bloody stools.   Resp: No shortness of breath with exertion or at rest.  No excess mucus, no productive cough,  No non-productive cough,  No coughing up of blood.  No change in color of mucus.  No wheezing.  No chest wall deformity  Skin: no rash or lesions.  GU: no dysuria, change in color of urine, no urgency or frequency.  No flank pain, no hematuria   MS:  No joint pain or swelling.  No decreased range of motion.  No back pain.    Physical Exam  BP 118/68 (BP Location: Left Arm, Cuff Size: Normal)    Pulse 83    Temp 98.2 F (36.8 C) (Oral)    Ht 6' (1.829 m)    Wt 195 lb (88.5 kg)    SpO2 98%    BMI 26.45 kg/m   GEN: A/Ox3; pleasant , NAD, well nourished    HEENT:  [place]/AT,    NOSE-clear, THROAT-clear, no lesions, no postnasal drip or exudate noted.   NECK:  Supple w/ fair ROM; no JVD; normal carotid impulses w/o bruits; no thyromegaly or nodules palpated; no lymphadenopathy.    RESP  Clear  P & A; w/o, wheezes/ rales/ or rhonchi. no accessory muscle use, no dullness to percussion  CARD:  RRR, no m/r/g, no peripheral edema, pulses intact, no cyanosis or clubbing.  GI:   Soft & nt; nml bowel sounds; no organomegaly or masses detected.   Musco: Warm bil, no deformities or joint swelling noted.   Neuro: alert, no focal deficits noted.    Skin: Warm, no lesions or rashes    Lab Results:   BNP No results found for: BNP  ProBNP No results found for: PROBNP  Imaging: No results found.    PFT Results Latest Ref Rng & Units 11/19/2019 11/17/2017  FVC-Pre L 4.62 4.27  FVC-Predicted Pre % 102 93  FVC-Post L 4.22 4.21  FVC-Predicted Post % 93 92  Pre FEV1/FVC % % 77 77  Post FEV1/FCV % % 83 81  FEV1-Pre L 3.56 3.28  FEV1-Predicted Pre % 110 99  FEV1-Post L 3.50 3.43  DLCO UNC% % 108 75  DLCO COR %Predicted % 118 90  TLC L 6.72 7.04  TLC % Predicted % 87 91  RV % Predicted % 82 102    No results found for: NITRICOXIDE      Assessment & Plan:   Asbestosis ([place]) Clinically stable - PFT is normal and stable  Check cxr today   Plan  Patient Instructions  Chest xray today  Activity as tolerated Follow up in 1 year with Dr. Vaughan Browner - Spirometry with DLCO .          Rexene Edison, NP 11/29/2019

## 2019-11-29 NOTE — Patient Instructions (Signed)
Chest xray today  Activity as tolerated Follow up in 1 year with Dr. Vaughan Browner - Spirometry with DLCO .

## 2019-12-04 ENCOUNTER — Other Ambulatory Visit: Payer: Self-pay | Admitting: Internal Medicine

## 2019-12-24 DIAGNOSIS — L720 Epidermal cyst: Secondary | ICD-10-CM | POA: Diagnosis not present

## 2019-12-24 DIAGNOSIS — L02212 Cutaneous abscess of back [any part, except buttock]: Secondary | ICD-10-CM | POA: Diagnosis not present

## 2019-12-24 DIAGNOSIS — Z85828 Personal history of other malignant neoplasm of skin: Secondary | ICD-10-CM | POA: Diagnosis not present

## 2019-12-29 ENCOUNTER — Other Ambulatory Visit: Payer: Self-pay | Admitting: Internal Medicine

## 2020-01-07 ENCOUNTER — Other Ambulatory Visit: Payer: Self-pay | Admitting: Internal Medicine

## 2020-01-15 ENCOUNTER — Ambulatory Visit: Payer: Medicare Other | Admitting: Internal Medicine

## 2020-02-07 DIAGNOSIS — E1351 Other specified diabetes mellitus with diabetic peripheral angiopathy without gangrene: Secondary | ICD-10-CM | POA: Diagnosis not present

## 2020-02-07 DIAGNOSIS — L84 Corns and callosities: Secondary | ICD-10-CM | POA: Diagnosis not present

## 2020-02-07 DIAGNOSIS — B351 Tinea unguium: Secondary | ICD-10-CM | POA: Diagnosis not present

## 2020-02-13 ENCOUNTER — Encounter: Payer: Self-pay | Admitting: Internal Medicine

## 2020-02-13 DIAGNOSIS — H43813 Vitreous degeneration, bilateral: Secondary | ICD-10-CM | POA: Diagnosis not present

## 2020-02-13 DIAGNOSIS — Z961 Presence of intraocular lens: Secondary | ICD-10-CM | POA: Diagnosis not present

## 2020-02-13 DIAGNOSIS — H401231 Low-tension glaucoma, bilateral, mild stage: Secondary | ICD-10-CM | POA: Diagnosis not present

## 2020-02-13 DIAGNOSIS — H524 Presbyopia: Secondary | ICD-10-CM | POA: Diagnosis not present

## 2020-02-13 LAB — HM DIABETES EYE EXAM

## 2020-02-18 DIAGNOSIS — Z23 Encounter for immunization: Secondary | ICD-10-CM | POA: Diagnosis not present

## 2020-02-19 ENCOUNTER — Ambulatory Visit: Payer: Medicare Other | Admitting: Internal Medicine

## 2020-02-24 ENCOUNTER — Other Ambulatory Visit: Payer: Self-pay | Admitting: Internal Medicine

## 2020-02-24 NOTE — Telephone Encounter (Signed)
Please advise 

## 2020-02-27 ENCOUNTER — Other Ambulatory Visit: Payer: Self-pay | Admitting: Internal Medicine

## 2020-02-28 ENCOUNTER — Ambulatory Visit (INDEPENDENT_AMBULATORY_CARE_PROVIDER_SITE_OTHER): Payer: Medicare Other | Admitting: Internal Medicine

## 2020-02-28 ENCOUNTER — Other Ambulatory Visit: Payer: Self-pay | Admitting: Internal Medicine

## 2020-02-28 ENCOUNTER — Other Ambulatory Visit: Payer: Self-pay

## 2020-02-28 ENCOUNTER — Encounter: Payer: Self-pay | Admitting: Internal Medicine

## 2020-02-28 VITALS — BP 118/68 | HR 51 | Temp 97.7°F | Resp 16 | Ht 72.0 in | Wt 194.1 lb

## 2020-02-28 DIAGNOSIS — Z23 Encounter for immunization: Secondary | ICD-10-CM

## 2020-02-28 DIAGNOSIS — I10 Essential (primary) hypertension: Secondary | ICD-10-CM

## 2020-02-28 DIAGNOSIS — E7800 Pure hypercholesterolemia, unspecified: Secondary | ICD-10-CM | POA: Diagnosis not present

## 2020-02-28 DIAGNOSIS — E1140 Type 2 diabetes mellitus with diabetic neuropathy, unspecified: Secondary | ICD-10-CM

## 2020-02-28 NOTE — Patient Instructions (Addendum)
Continue checking your blood sugars and blood pressures   GO TO THE LAB : Get the blood work     Chase Martinez, Chase Martinez Come back for a checkup in 4 to 5 months    Diabetes Mellitus and [place] care is an important part of your health, especially when you have diabetes. Diabetes may cause you to have problems because of poor blood flow (circulation) to your feet and legs, which can cause your skin to:  Become thinner and drier.  Break more easily.  Heal more slowly.  Peel and crack. You may also have nerve damage (neuropathy) in your legs and feet, causing decreased feeling in them. This means that you may not notice minor injuries to your feet that could lead to more serious problems. Noticing and addressing any potential problems early is the best way to prevent future foot problems. How to care for your feet Foot hygiene  Wash your feet daily with warm water and mild soap. Do not use hot water. Then, pat your feet and the areas between your toes until they are completely dry. Do not soak your feet as this can dry your skin.  Trim your toenails straight across. Do not dig under them or around the cuticle. File the edges of your nails with an emery board or nail file.  Apply a moisturizing lotion or petroleum jelly to the skin on your feet and to dry, brittle toenails. Use lotion that does not contain alcohol and is unscented. Do not apply lotion between your toes. Shoes and socks  Wear clean socks or stockings every day. Make sure they are not too tight. Do not wear knee-high stockings since they may decrease blood flow to your legs.  Wear shoes that fit properly and have enough cushioning. Always look in your shoes before you put them on to be sure there are no objects inside.  To break in new shoes, wear them for just a few hours a day. This prevents injuries on your feet. Wounds, scrapes, corns, and calluses  Check your feet daily  for blisters, cuts, bruises, sores, and redness. If you cannot see the bottom of your feet, use a mirror or ask someone for help.  Do not cut corns or calluses or try to remove them with medicine.  If you find a minor scrape, cut, or break in the skin on your feet, keep it and the skin around it clean and dry. You may clean these areas with mild soap and water. Do not clean the area with peroxide, alcohol, or iodine.  If you have a wound, scrape, corn, or callus on your foot, look at it several times a day to make sure it is healing and not infected. Check for: ? Redness, swelling, or pain. ? Fluid or blood. ? Warmth. ? Pus or a bad smell. General instructions  Do not cross your legs. This may decrease blood flow to your feet.  Do not use heating pads or hot water bottles on your feet. They may burn your skin. If you have lost feeling in your feet or legs, you may not know this is happening until it is too late.  Protect your feet from hot and cold by wearing shoes, such as at the beach or on hot pavement.  Schedule a complete foot exam at least once a year (annually) or more often if you have foot problems. If you have foot problems, report any cuts, sores,  or bruises to your health care provider immediately. Contact a health care provider if:  You have a medical condition that increases your risk of infection and you have any cuts, sores, or bruises on your feet.  You have an injury that is not healing.  You have redness on your legs or feet.  You feel burning or tingling in your legs or feet.  You have pain or cramps in your legs and feet.  Your legs or feet are numb.  Your feet always feel cold.  You have pain around a toenail. Get help right away if:  You have a wound, scrape, corn, or callus on your foot and: ? You have pain, swelling, or redness that gets worse. ? You have fluid or blood coming from the wound, scrape, corn, or callus. ? Your wound, scrape, corn, or  callus feels warm to the touch. ? You have pus or a bad smell coming from the wound, scrape, corn, or callus. ? You have a fever. ? You have a red line going up your leg. Summary  Check your feet every day for cuts, sores, red spots, swelling, and blisters.  Moisturize feet and legs daily.  Wear shoes that fit properly and have enough cushioning.  If you have foot problems, report any cuts, sores, or bruises to your health care provider immediately.  Schedule a complete foot exam at least once a year (annually) or more often if you have foot problems. This information is not intended to replace advice given to you by your health care provider. Make sure you discuss any questions you have with your health care provider. Document Revised: 01/31/2019 Document Reviewed: 06/11/2016 Elsevier Patient Education  [place].

## 2020-02-28 NOTE — Progress Notes (Signed)
Subjective:    Patient ID: Chase Martinez, male    DOB: 08-Aug-1940, 79 y.o.   MRN: [id number]  DOS:  02/28/2020 Type of visit - description: f/u In general feels well. He keeps himself very active, trying to eat healthy. We reviewed together his BP is ambulatory CBGs. Neuropathy symptoms at baseline, mostly numbness. Patient played golf with a friend last week, his friend turned out to be Covid positive.  Harrie Jeans remains asymptomatic and will be tested today.   Review of Systems See above   Past Medical History:  Diagnosis Date  . Basal cell carcinoma    dr Ronnald Ramp  . Diabetes mellitus   . Glaucoma   . Hyperlipidemia   . Hypertension   . 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 Premature atrial contractions     Past Surgical History:  Procedure Laterality Date  . CATARACT EXTRACTION Bilateral 2015  . PILONIDAL CYST EXCISION    . REFRACTIVE SURGERY    . SHOULDER SURGERY     right  . TONSILLECTOMY      Allergies as of 02/28/2020   No Known Allergies     Medication List       Accurate as of February 28, 2020 11:59 PM. If you have any questions, ask your nurse or doctor.        atorvastatin 20 MG tablet Commonly known as: LIPITOR TAKE 1 TABLET DAILY   diltiazem 240 MG 24 hr tablet Commonly known as: CARDIZEM LA TAKE 1 TABLET DAILY   Fish Oil 1000 MG Caps Take 1 capsule by mouth daily.   freestyle lancets USE AS DIRECTED TO CHECK BLOOD SUGARS 3 TO 4 TIMES DAILY What changed: See the new instructions.   FREESTYLE LITE test strip Generic drug: glucose blood USE TO CHECK BLOOD SUGAR NOT MORE THAN TWICE A DAY   furosemide 20 MG tablet Commonly known as: LASIX Take 1 tablet (20 mg total) by mouth daily.   Januvia 100 MG tablet Generic drug: sitaGLIPtin TAKE 1 TABLET DAILY   metFORMIN 1000 MG tablet Commonly known as: GLUCOPHAGE Take 1 tablet (1,000 mg total) by mouth 2 (two) times daily with a meal.    metoprolol succinate 100 MG 24 hr tablet Commonly known as: TOPROL-XL Take 1 tablet (100 mg total) by mouth daily. Take with or immediately following a meal.   multivitamin per tablet Take 1 tablet by mouth daily.   pioglitazone 30 MG tablet Commonly known as: ACTOS TAKE 1 TABLET DAILY   tamsulosin 0.4 MG Caps capsule Commonly known as: FLOMAX Take 1 capsule (0.4 mg total) by mouth daily after supper.   vitamin C 1000 MG tablet Take 1 tablet by mouth daily.   Vitamin D3 50 MCG (2000 UT) capsule Take 1 capsule by mouth daily.          Objective:   Physical Exam BP 118/68 (BP Location: Left Arm, Patient Position: Sitting, Cuff Size: Small)   Pulse (!) 51   Temp 97.7 F (36.5 C) (Oral)   Resp 16   Ht 6' (1.829 m)   Wt 194 lb 1 oz (88 kg)   SpO2 96%   BMI 26.32 kg/m  General:   Well developed, NAD, BMI noted. HEENT:  Normocephalic . Face symmetric, atraumatic Lungs:  CTA B Normal respiratory effort, no intercostal retractions, no accessory muscle use. Heart: RRR,  no murmur.  DM foot exam: No edema, good pedal pulses, + deformities without  ulcers, pinprick examination: Mild distal numbness mostly on the right.  Nails are dystrophic Skin: Not pale. Not jaundice Neurologic:  alert & oriented X3.  Speech normal, gait appropriate for age and unassisted Psych--  Cognition and judgment appear intact.  Cooperative with normal attention span and concentration.  Behavior appropriate. No anxious or depressed appearing.      Assessment     Assessment: DM: + neuropathy HTN Hyperlipidemia PVCs  -- DR Thomasena Edis 03-2015, on CCB,BB BPH BCC Dr. Ronnald Ramp Nail dystrophy  Asbestosis dx 2019  PLAN: DM: Ambulatory CBGs in the low 100s, continue Metformin, Actos.  Foot exam showing neuropathy, feet care encouraged and information provided.  Check a A1c and micro HTN: On Cardizem, Lasix, metoprolol.  Ambulatory BPs range from 101/50, 115/60.  When BP is in the low side  (asx).  Check a CMP, CBC. High cholesterol: On Lipitor, check FLP. Preventive care: Flu shot today. Was exposed to  Covid patient last week, he remains asymptomatic, to be tested today, asked to let me know the results. RTC 5 months    This visit occurred during the SARS-CoV-2 public health emergency.  Safety protocols were in place, including screening questions prior to the visit, additional usage of staff PPE, and extensive cleaning of exam room while observing appropriate contact time as indicated for disinfecting solutions.

## 2020-02-28 NOTE — Progress Notes (Signed)
Pre visit review using our clinic review tool, if applicable. No additional management support is needed unless otherwise documented below in the visit note. 

## 2020-02-29 LAB — COMPREHENSIVE METABOLIC PANEL
AG Ratio: 1.5 (calc) (ref 1.0–2.5)
ALT: 13 U/L (ref 9–46)
AST: 17 U/L (ref 10–35)
Albumin: 3.9 g/dL (ref 3.6–5.1)
Alkaline phosphatase (APISO): 56 U/L (ref 35–144)
BUN: 18 mg/dL (ref 7–25)
CO2: 25 mmol/L (ref 20–32)
Calcium: 9.5 mg/dL (ref 8.6–10.3)
Chloride: 102 mmol/L (ref 98–110)
Creat: 1.01 mg/dL (ref 0.70–1.11)
Globulin: 2.6 g/dL (calc) (ref 1.9–3.7)
Glucose, Bld: 119 mg/dL — ABNORMAL HIGH (ref 65–99)
Potassium: 4.5 mmol/L (ref 3.5–5.3)
Sodium: 137 mmol/L (ref 135–146)
Total Bilirubin: 0.6 mg/dL (ref 0.2–1.2)
Total Protein: 6.5 g/dL (ref 6.1–8.1)

## 2020-02-29 LAB — CBC WITH DIFFERENTIAL/PLATELET
Absolute Monocytes: 701 cells/uL (ref 200–950)
Basophils Absolute: 62 cells/uL (ref 0–200)
Basophils Relative: 0.8 %
Eosinophils Absolute: 354 cells/uL (ref 15–500)
Eosinophils Relative: 4.6 %
HCT: 38 % — ABNORMAL LOW (ref 38.5–50.0)
Hemoglobin: 12.8 g/dL — ABNORMAL LOW (ref 13.2–17.1)
Lymphs Abs: 1378 cells/uL (ref 850–3900)
MCH: 32.2 pg (ref 27.0–33.0)
MCHC: 33.7 g/dL (ref 32.0–36.0)
MCV: 95.5 fL (ref 80.0–100.0)
MPV: 10 fL (ref 7.5–12.5)
Monocytes Relative: 9.1 %
Neutro Abs: 5205 cells/uL (ref 1500–7800)
Neutrophils Relative %: 67.6 %
Platelets: 255 10*3/uL (ref 140–400)
RBC: 3.98 10*6/uL — ABNORMAL LOW (ref 4.20–5.80)
RDW: 12.6 % (ref 11.0–15.0)
Total Lymphocyte: 17.9 %
WBC: 7.7 10*3/uL (ref 3.8–10.8)

## 2020-02-29 LAB — HEMOGLOBIN A1C
Hgb A1c MFr Bld: 6 % of total Hgb — ABNORMAL HIGH (ref <5.7)
Mean Plasma Glucose: 126 (calc)
eAG (mmol/L): 7 (calc)

## 2020-02-29 LAB — LIPID PANEL
Cholesterol: 105 mg/dL (ref <200)
HDL: 48 mg/dL (ref >=40)
LDL Cholesterol (Calc): 43 mg/dL (calc)
Non-HDL Cholesterol (Calc): 57 mg/dL (calc) (ref <130)
Total CHOL/HDL Ratio: 2.2 (calc) (ref <5.0)
Triglycerides: 59 mg/dL (ref <150)

## 2020-02-29 LAB — MICROALBUMIN / CREATININE URINE RATIO
Creatinine, Urine: 57 mg/dL (ref 20–320)
Microalb Creat Ratio: 5 mcg/mg creat (ref <30)
Microalb, Ur: 0.3 mg/dL

## 2020-03-01 ENCOUNTER — Encounter: Payer: Self-pay | Admitting: Internal Medicine

## 2020-03-01 NOTE — Assessment & Plan Note (Signed)
DM: Ambulatory CBGs in the low 100s, continue Metformin, Actos.  Foot exam showing neuropathy, feet care encouraged and information provided.  Check a A1c and micro HTN: On Cardizem, Lasix, metoprolol.  Ambulatory BPs range from 101/50, 115/60.  When BP is in the low side (asx).  Check a CMP, CBC. High cholesterol: On Lipitor, check FLP. Preventive care: Flu shot today. Was exposed to  Covid patient last week, he remains asymptomatic, to be tested today, asked to let me know the results. RTC 5 months

## 2020-03-06 DIAGNOSIS — D225 Melanocytic nevi of trunk: Secondary | ICD-10-CM | POA: Diagnosis not present

## 2020-03-06 DIAGNOSIS — L720 Epidermal cyst: Secondary | ICD-10-CM | POA: Diagnosis not present

## 2020-03-06 DIAGNOSIS — D2261 Melanocytic nevi of right upper limb, including shoulder: Secondary | ICD-10-CM | POA: Diagnosis not present

## 2020-03-06 DIAGNOSIS — Z85828 Personal history of other malignant neoplasm of skin: Secondary | ICD-10-CM | POA: Diagnosis not present

## 2020-03-06 DIAGNOSIS — L821 Other seborrheic keratosis: Secondary | ICD-10-CM | POA: Diagnosis not present

## 2020-03-06 DIAGNOSIS — L570 Actinic keratosis: Secondary | ICD-10-CM | POA: Diagnosis not present

## 2020-03-18 ENCOUNTER — Ambulatory Visit (INDEPENDENT_AMBULATORY_CARE_PROVIDER_SITE_OTHER): Payer: Medicare Other | Admitting: Cardiology

## 2020-03-18 ENCOUNTER — Other Ambulatory Visit: Payer: Self-pay

## 2020-03-18 ENCOUNTER — Encounter: Payer: Self-pay | Admitting: Cardiology

## 2020-03-18 ENCOUNTER — Telehealth: Payer: Self-pay | Admitting: Cardiology

## 2020-03-18 VITALS — BP 126/58 | HR 40 | Ht 72.0 in | Wt 203.6 lb

## 2020-03-18 DIAGNOSIS — E1140 Type 2 diabetes mellitus with diabetic neuropathy, unspecified: Secondary | ICD-10-CM | POA: Diagnosis not present

## 2020-03-18 DIAGNOSIS — I441 Atrioventricular block, second degree: Secondary | ICD-10-CM

## 2020-03-18 DIAGNOSIS — I491 Atrial premature depolarization: Secondary | ICD-10-CM

## 2020-03-18 NOTE — Patient Instructions (Addendum)
Medication Instructions:  Stop diltiazem  Stop metoprolol succinate  *If you need a refill on your cardiac medications before your next appointment, please call your pharmacy*  Lab Work: None ordered.  If you have labs (blood work) drawn today and your tests are completely normal, you will receive your results only by: Marland Kitchen MyChart Message (if you have MyChart) OR . A paper copy in the mail If you have any lab test that is abnormal or we need to change your treatment, we will call you to review the results.  Testing/Procedures: None ordered.  Follow-Up: At [facility], you and your health needs are our priority.  As part of our continuing mission to provide you with exceptional heart care, we have created designated Provider Care Teams.  These Care Teams include your primary Cardiologist (physician) and Advanced Practice Providers (APPs -  Physician Assistants and Nurse Practitioners) who all work together to provide you with the care you need, when you need it.  We recommend signing up for the patient portal called "MyChart".  Sign up information is provided on this After Visit Summary.  MyChart is used to connect with patients for Virtual Visits (Telemedicine).  Patients are able to view lab/test results, encounter notes, upcoming appointments, etc.  Non-urgent messages can be sent to your provider as well.   To learn more about what you can do with MyChart, go to [email or web address].    Your next appointment:   Your physician wants you to follow-up in: 03/26/20 at 1:45 pm with Dr. Quentin Ore.    Other Instructions:

## 2020-03-18 NOTE — Progress Notes (Signed)
Electrophysiology Office Note:    Date:  03/18/2020   ID:  Chase Martinez, DOB 1940-09-15, MRN [id number]  PCP:  Colon Branch, MD  [facility] Cardiologist:  Fransico Him, MD  [facility] Electrophysiologist:  Vickie Epley, MD   Referring MD: Colon Branch, MD   Chief Complaint: Symptomatic bradycardia  History of Present Illness:    Chase Martinez is a 79 y.o. male who presents for an evaluation of symptomatic bradycardia at the request of Dr. Larose Martinez. Their medical history includes diabetes, hypertension, hyperlipidemia.  Patient reached out to the clinic via [place] today and reported dyspnea and lightheadedness.  He checked his vitals and his blood pressure was 135/53 with a heart rate of 38.  He last saw Dr. Larose Martinez on February 28, 2020.  At that appointment he reported feeling well with a good exercise capacity.  He reported playing golf with a friend the previous week.  During today's visit he tells me that he played golf yesterday without limitation.  He usually walks 18.  No syncope or presyncope.  Today he tells me that he was fatigued with very little exertion which is a change for him.  Past Medical History:  Diagnosis Date  . Basal cell carcinoma    dr Ronnald Ramp  . Diabetes mellitus   . Glaucoma   . Hyperlipidemia   . Hypertension   . 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 Premature atrial contractions     Past Surgical History:  Procedure Laterality Date  . CATARACT EXTRACTION Bilateral 2015  . PILONIDAL CYST EXCISION    . REFRACTIVE SURGERY    . SHOULDER SURGERY     right  . TONSILLECTOMY      Current Medications: Current Meds  Medication Sig  . Ascorbic Acid (VITAMIN C) 1000 MG tablet Take 1 tablet by mouth daily.  Marland Kitchen atorvastatin (LIPITOR) 20 MG tablet TAKE 1 TABLET DAILY  . Cholecalciferol (VITAMIN D3) 50 MCG (2000 UT) capsule Take 1 capsule by mouth daily.  Marland Kitchen FREESTYLE LITE test strip USE TO CHECK  BLOOD SUGAR NOT MORE THAN TWICE A DAY  . furosemide (LASIX) 20 MG tablet Take 1 tablet (20 mg total) by mouth daily.  Marland Kitchen JANUVIA 100 MG tablet TAKE 1 TABLET DAILY  . Lancets (FREESTYLE) lancets USE AS DIRECTED TO CHECK BLOOD SUGARS 3 TO 4 TIMES DAILY  . metoprolol succinate (TOPROL-XL) 100 MG 24 hr tablet Take 1 tablet (100 mg total) by mouth daily. Take with or immediately following a meal.  . multivitamin (THERAGRAN) per tablet Take 1 tablet by mouth daily.    . Omega-3 Fatty Acids (FISH OIL) 1000 MG CAPS Take 1 capsule by mouth daily.   . pioglitazone (ACTOS) 30 MG tablet TAKE 1 TABLET DAILY  . tamsulosin (FLOMAX) 0.4 MG CAPS capsule Take 1 capsule (0.4 mg total) by mouth daily after supper.  . [DISCONTINUED] diltiazem (CARDIZEM LA) 240 MG 24 hr tablet TAKE 1 TABLET DAILY  . [DISCONTINUED] metFORMIN (GLUCOPHAGE) 1000 MG tablet Take 1 tablet (1,000 mg total) by mouth 2 (two) times daily with a meal.     Allergies:   Patient has no known allergies.   Social History   Socioeconomic History  . Marital status: Married    Spouse name: Not on file  . Number of children: 2  . Years of education: Not on file  . Highest education level: Not on file  Occupational History  .  Occupation: Retired Therapist, art Engineering geologist)  Tobacco Use  . Smoking status: Former Smoker    Packs/day: 1.00    Types: Cigarettes, Cigars    Quit date: 05/24/1966    Years since quitting: 53.8  . Smokeless tobacco: Former Systems developer    Types: Snuff    Quit date: 05/24/1988  Vaping Use  . Vaping Use: Never used  Substance and Sexual Activity  . Alcohol use: Yes    Alcohol/week: 7.0 - 14.0 standard drinks    Types: 7 - 14 Glasses of wine per week    Comment: occasional beer and wine  . Drug use: No  . Sexual activity: Yes  Other Topics Concern  . Not on file  Social History Narrative   Household-- pt and wife    Son lives near by, daughter near [place] Strain:   .  Difficulty of Paying Living Expenses: Not on file  Food Insecurity:   . Worried About Charity fundraiser in the Last Year: Not on file  . Ran Out of Food in the Last Year: Not on file  Transportation Needs:   . Lack of Transportation (Medical): Not on file  . Lack of Transportation (Non-Medical): Not on file  Physical Activity:   . Days of Exercise per Week: Not on file  . Minutes of Exercise per Session: Not on file  Stress:   . Feeling of Stress : Not on file  Social Connections:   . Frequency of Communication with Friends and Family: Not on file  . Frequency of Social Gatherings with Friends and Family: Not on file  . Attends Religious Services: Not on file  . Active Member of Clubs or Organizations: Not on file  . Attends Archivist Meetings: Not on file  . Marital Status: Not on file     Family History: The patient's family history includes Dementia in his father; Glaucoma in his mother; Hypertension in his mother; Parkinsonism in his sister; Thyroid cancer in his son. There is no history of Coronary artery disease, Diabetes, Stroke, Prostate cancer, Colon cancer, Esophageal cancer, or Stomach cancer.  ROS:   Please see the history of present illness.    All other systems reviewed and are negative.  EKGs/Labs/Other Studies Reviewed:    The following studies were reviewed today: Prior EKGs and records  July 05, 2019 EKG shows sinus rhythm with a PR interval of 192 ms and a QRS duration of 98 ms.  EKG:  The ekg ordered today demonstrates sinus rhythm with 2-1 AV block.  Ventricular rate in the 40s.  Recent Labs: 02/28/2020: ALT 13; BUN 18; Creat 1.01; Hemoglobin 12.8; Platelets 255; Potassium 4.5; Sodium 137  Recent Lipid Panel    Component Value Date/Time   CHOL 105 02/28/2020 0918   TRIG 59 02/28/2020 0918   TRIG 74 05/31/2006 0933   HDL 48 02/28/2020 0918   CHOLHDL 2.2 02/28/2020 0918   VLDL 25.6 12/07/2018 0948   LDLCALC 43 02/28/2020 0918     Physical Exam:    VS:  BP (!) 126/58   Pulse (!) 40   Ht 6' (1.829 m)   Wt 203 lb 9.6 oz (92.4 kg)   SpO2 97%   BMI 27.61 kg/m     Wt Readings from Last 3 Encounters:  03/18/20 203 lb 9.6 oz (92.4 kg)  02/28/20 194 lb 1 oz (88 kg)  11/29/19 195 lb (88.5 kg)  GEN:  Well nourished, well developed in no acute distress HEENT: Normal NECK: No JVD; No carotid bruits LYMPHATICS: No lymphadenopathy CARDIAC: Bradycardic, regular rhythm, no murmurs, rubs, gallops RESPIRATORY:  Clear to auscultation without rales, wheezing or rhonchi  ABDOMEN: Soft, non-tender, non-distended MUSCULOSKELETAL:  No edema; No deformity  SKIN: Warm and dry NEUROLOGIC:  Alert and oriented x 3 PSYCHIATRIC:  Normal affect   ASSESSMENT:    1. AV block, 2nd degree   2. Premature atrial contractions   3. Type 2 diabetes mellitus with diabetic neuropathy, without long-term current use of insulin (HCC)    PLAN:    In order of problems listed above:  1. AV block, second-degree Patient with new onset second-degree AV block.  He is currently taking diltiazem 240 mg once daily and metoprolol succinate 100 mg once a day.  No syncope or presyncope.  He has a stable ventricular rate in the 40s.  I have recommended that he stop taking the diltiazem and metoprolol and I will see him back in clinic next week to reassess his EKG.  If he remains in second-degree AV block or if his symptoms do not improve, will plan for permanent pacemaker implant.  I have also discussed the possibility that by removing the beta-blocker and calcium channel blocker he may experience an increase in his PACs.  If this occurs we may need to restart his medical therapy and implant a permanent pacemaker.  I discussed the procedure in detail with the patient and his wife who is with him today and they are agreeable with this plan.  2.  PACs No PACs on today's EKG.  Will monitor for recurrence given that we are stopping his metoprolol and  diltiazem.  3.  Diabetes Well-controlled on his current medical therapy.    Medication Adjustments/Labs and Tests Ordered: Current medicines are reviewed at length with the patient today.  Concerns regarding medicines are outlined above.  Orders Placed This Encounter  Procedures  . EKG 12-Lead   No orders of the defined types were placed in this encounter.    Signed, Lars Mage, MD, [facility]  03/18/2020 4:43 PM    Electrophysiology [place]

## 2020-03-18 NOTE — Telephone Encounter (Signed)
Patient c/o Palpitations:  High priority if patient c/o lightheadedness, shortness of breath, or chest pain  1) How long have you had palpitations/irregular HR/ Afib? Palpitations start last night.  Are you having the symptoms now? Yes   2) Are you currently experiencing lightheadedness, SOB or CP? SOB & lightheaded when he goes up stairs,   3) Do you have a history of afib (atrial fibrillation) or irregular heart rhythm? Yes, history of paliptations  4) Have you checked your BP or HR? (document readings if available): 135/53 HR 38  5) Are you experiencing any other symptoms? NO

## 2020-03-18 NOTE — Telephone Encounter (Signed)
Printed for Dr. Quentin Ore to review.

## 2020-03-18 NOTE — Telephone Encounter (Signed)
Mr. Litke has been treated for palpitations/PACs by Dr. Radford Pax for years.  He is usually very active (he walks when he plays golf), but this morning he could barely walk in his backyard without getting extremely short of breath and lightheaded. He is also extremely anxious on the phone.  Today, his BP was 135/53 and HR 38. He states his HR is usually in the 40s-60s.  He denies any other symptoms, specifically CP, nausea, sweating. He took all of his medications at 0600 today, including dilt and Toprol.  He does not feel he is in acute distress/SOB as long as he doesn't exert himself. He states he has friends with COPD and he "feels like that." Scheduled the patient for EKG/evaluation with Dr. Quentin Ore (DOD) today. ER precautions reviewed.  The patient was grateful for assistance.

## 2020-03-19 MED ORDER — METFORMIN HCL 1000 MG PO TABS
1000.0000 mg | ORAL_TABLET | Freq: Two times a day (BID) | ORAL | 1 refills | Status: DC
Start: 2020-03-19 — End: 2020-11-25

## 2020-03-19 NOTE — Addendum Note (Signed)
Addended by: Darrell Jewel on: 03/19/2020 08:49 AM   Modules accepted: Orders

## 2020-03-21 ENCOUNTER — Other Ambulatory Visit: Payer: Self-pay | Admitting: Internal Medicine

## 2020-03-26 ENCOUNTER — Encounter: Payer: Self-pay | Admitting: Cardiology

## 2020-03-26 ENCOUNTER — Other Ambulatory Visit: Payer: Self-pay

## 2020-03-26 ENCOUNTER — Ambulatory Visit (INDEPENDENT_AMBULATORY_CARE_PROVIDER_SITE_OTHER): Payer: Medicare Other | Admitting: Cardiology

## 2020-03-26 VITALS — BP 120/62 | HR 50 | Ht 72.0 in | Wt 199.2 lb

## 2020-03-26 DIAGNOSIS — I491 Atrial premature depolarization: Secondary | ICD-10-CM

## 2020-03-26 DIAGNOSIS — I441 Atrioventricular block, second degree: Secondary | ICD-10-CM | POA: Diagnosis not present

## 2020-03-26 NOTE — Progress Notes (Signed)
Electrophysiology Office Follow up Visit Note:    Date:  03/26/2020   ID:  [name], DOB [birth year], MRN [id number]  PCP:  Colon Branch, MD  [facility] Cardiologist:  Fransico Him, MD  [facility] Electrophysiologist:  Vickie Epley, MD    Interval History:    Chase Martinez is a 79 y.o. male who presents for a follow up visit. 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  At that visit his calcium channel blocker and beta-blocker were stopped.  He tells me his symptoms have improved but now he is having palpitations related to PACs.  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   Past Medical History:  Diagnosis Date  . Basal cell carcinoma    dr Ronnald Ramp  . Diabetes mellitus   . Glaucoma   . Hyperlipidemia   . Hypertension   . 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 Premature atrial contractions     Past Surgical History:  Procedure Laterality Date  . CATARACT EXTRACTION Bilateral 2015  . PILONIDAL CYST EXCISION    . REFRACTIVE SURGERY    . SHOULDER SURGERY     right  . TONSILLECTOMY      Current Medications: Current Meds  Medication Sig  . Ascorbic Acid (VITAMIN C) 1000 MG tablet Take 1 tablet by mouth daily.  Marland Kitchen atorvastatin (LIPITOR) 20 MG tablet TAKE 1 TABLET DAILY  . Cholecalciferol (VITAMIN D3) 50 MCG (2000 UT) capsule Take 1 capsule by mouth daily.  Marland Kitchen FREESTYLE LITE test strip USE TO CHECK BLOOD SUGAR NOT MORE THAN TWICE A DAY  . furosemide (LASIX) 20 MG tablet Take 1 tablet (20 mg total) by mouth daily.  Marland Kitchen JANUVIA 100 MG tablet TAKE 1 TABLET DAILY  . Lancets (FREESTYLE) lancets USE AS DIRECTED TO CHECK BLOOD SUGARS 3 TO 4 TIMES DAILY  . metFORMIN (GLUCOPHAGE) 1000 MG tablet Take 1 tablet (1,000 mg total) by mouth 2 (two) times daily with a meal.  . multivitamin (THERAGRAN) per tablet Take 1 tablet by mouth daily.    . Omega-3 Fatty  Acids (FISH OIL) 1000 MG CAPS Take 1 capsule by mouth daily.   . pioglitazone (ACTOS) 30 MG tablet TAKE 1 TABLET DAILY  . tamsulosin (FLOMAX) 0.4 MG CAPS capsule Take 1 capsule (0.4 mg total) by mouth daily after supper.     Allergies:   Patient has no known allergies.   Social History   Socioeconomic History  . Marital status: Married    Spouse name: Not on file  . Number of children: 2  . Years of education: Not on file  . Highest education level: Not on file  Occupational History  . Occupation: Retired Therapist, art Engineering geologist)  Tobacco Use  . Smoking status: Former Smoker    Packs/day: 1.00    Types: Cigarettes, Cigars    Quit date: 05/24/1966    Years since quitting: 53.8  . Smokeless tobacco: Former Systems developer    Types: Snuff    Quit date: 05/24/1988  Vaping Use  . Vaping Use: Never used  Substance and Sexual Activity  . Alcohol use: Yes    Alcohol/week: 7.0 - 14.0 standard drinks    Types: 7 - 14 Glasses of wine per week    Comment: occasional beer and wine  . Drug use: No  . Sexual activity: Yes  Other Topics Concern  . Not on  file  Social History Narrative   Household-- pt and wife    Son lives near by, daughter near [place] Strain:   . Difficulty of Paying Living Expenses: Not on file  Food Insecurity:   . Worried About Charity fundraiser in the Last Year: Not on file  . Ran Out of Food in the Last Year: Not on file  Transportation Needs:   . Lack of Transportation (Medical): Not on file  . Lack of Transportation (Non-Medical): Not on file  Physical Activity:   . Days of Exercise per Week: Not on file  . Minutes of Exercise per Session: Not on file  Stress:   . Feeling of Stress : Not on file  Social Connections:   . Frequency of Communication with Friends and Family: Not on file  . Frequency of Social Gatherings with Friends and Family: Not on file  . Attends Religious Services: Not on file  . Active  Member of Clubs or Organizations: Not on file  . Attends Archivist Meetings: Not on file  . Marital Status: Not on file     Family History: The patient's family history includes Dementia in his father; Glaucoma in his mother; Hypertension in his mother; Parkinsonism in his sister; Thyroid cancer in his son. There is no history of Coronary artery disease, Diabetes, Stroke, Prostate cancer, Colon cancer, Esophageal cancer, or Stomach cancer.  ROS:   Please see the history of present illness.    All other systems reviewed and are negative.  EKGs/Labs/Other Studies Reviewed:    The following studies were reviewed today: Prior note, EKG  EKG:  The ekg ordered today demonstrates 2-1 and high degree AV block on rhythm strip.  Recent Labs: 02/28/2020: ALT 13; BUN 18; Creat 1.01; Hemoglobin 12.8; Platelets 255; Potassium 4.5; Sodium 137  Recent Lipid Panel    Component Value Date/Time   CHOL 105 02/28/2020 0918   TRIG 59 02/28/2020 0918   TRIG 74 05/31/2006 0933   HDL 48 02/28/2020 0918   CHOLHDL 2.2 02/28/2020 0918   VLDL 25.6 12/07/2018 0948   LDLCALC 43 02/28/2020 0918    Physical Exam:    VS:  BP 120/62   Pulse (!) 50   Ht 6' (1.829 m)   Wt 199 lb 3.2 oz (90.4 kg)   SpO2 98%   BMI 27.02 kg/m     Wt Readings from Last 3 Encounters:  03/26/20 199 lb 3.2 oz (90.4 kg)  03/18/20 203 lb 9.6 oz (92.4 kg)  02/28/20 194 lb 1 oz (88 kg)     GEN:  Well nourished, well developed in no acute distress HEENT: Normal NECK: No JVD; No carotid bruits LYMPHATICS: No lymphadenopathy CARDIAC: RRR, no murmurs, rubs, gallops RESPIRATORY:  Clear to auscultation without rales, wheezing or rhonchi  ABDOMEN: Soft, non-tender, non-distended MUSCULOSKELETAL:  No edema; No deformity  SKIN: Warm and dry NEUROLOGIC:  Alert and oriented x 3 PSYCHIATRIC:  Normal affect   ASSESSMENT:    1. AV block, 2nd degree   2. PAC (premature atrial contraction)    PLAN:    In order of  problems listed above:  1. Second-degree AV block/high degree AV block He continues to have evidence of high degree AV block despite stopping his calcium channel blocker and beta-blocker.  Now off this medications he is having symptomatic PACs.  Plan to proceed with dual-chamber permanent pacemaker implant so that we can safely  restart his calcium channel blocker and beta-blocker to control his PACs.  Risks and benefits of pacemaker were explained in detail to the patient and his wife today and they are agreeable to proceed.  We will get an echocardiogram prior to the procedure to confirm normal ejection fraction.  Risks, benefits, alternatives to PPM implantation were discussed in detail with the patient today. The patient understands that the risks include but are not limited to bleeding, infection, pneumothorax, perforation, tamponade, vascular damage, renal failure, MI, stroke, death, and lead dislodgement and wishes to proceed.  We will therefore schedule device implantation at the next available time.  2.  Symptomatic PACs Have worsened since stopping the calcium channel blocker and beta-blocker.  Plan to restart these medications once the permanent pacemaker is implanted.   Medication Adjustments/Labs and Tests Ordered: Current medicines are reviewed at length with the patient today.  Concerns regarding medicines are outlined above.  Orders Placed This Encounter  Procedures  . EKG 12-Lead  . ECHOCARDIOGRAM COMPLETE   No orders of the defined types were placed in this encounter.    Signed, Lars Mage, MD, [facility]  03/26/2020 2:16 PM    Electrophysiology [facility]

## 2020-03-26 NOTE — H&P (View-Only) (Signed)
Electrophysiology Office Follow up Visit Note:    Date:  03/26/2020   ID:  Chase Martinez, DOB Nov 30, 1940, MRN [id number]  PCP:  Colon Branch, MD  [facility] Cardiologist:  Fransico Him, MD  [facility] Electrophysiologist:  Vickie Epley, MD    Interval History:    Chase Martinez is a 79 y.o. male who presents for a follow up visit. 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  At that visit his calcium channel blocker and beta-blocker were stopped.  He tells me his symptoms have improved but now he is having palpitations related to PACs.  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   Past Medical History:  Diagnosis Date  . Basal cell carcinoma    dr Ronnald Ramp  . Diabetes mellitus   . Glaucoma   . Hyperlipidemia   . Hypertension   . 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 Premature atrial contractions     Past Surgical History:  Procedure Laterality Date  . CATARACT EXTRACTION Bilateral 2015  . PILONIDAL CYST EXCISION    . REFRACTIVE SURGERY    . SHOULDER SURGERY     right  . TONSILLECTOMY      Current Medications: Current Meds  Medication Sig  . Ascorbic Acid (VITAMIN C) 1000 MG tablet Take 1 tablet by mouth daily.  Marland Kitchen atorvastatin (LIPITOR) 20 MG tablet TAKE 1 TABLET DAILY  . Cholecalciferol (VITAMIN D3) 50 MCG (2000 UT) capsule Take 1 capsule by mouth daily.  Marland Kitchen FREESTYLE LITE test strip USE TO CHECK BLOOD SUGAR NOT MORE THAN TWICE A DAY  . furosemide (LASIX) 20 MG tablet Take 1 tablet (20 mg total) by mouth daily.  Marland Kitchen JANUVIA 100 MG tablet TAKE 1 TABLET DAILY  . Lancets (FREESTYLE) lancets USE AS DIRECTED TO CHECK BLOOD SUGARS 3 TO 4 TIMES DAILY  . metFORMIN (GLUCOPHAGE) 1000 MG tablet Take 1 tablet (1,000 mg total) by mouth 2 (two) times daily with a meal.  . multivitamin (THERAGRAN) per tablet Take 1 tablet by mouth daily.    . Omega-3 Fatty  Acids (FISH OIL) 1000 MG CAPS Take 1 capsule by mouth daily.   . pioglitazone (ACTOS) 30 MG tablet TAKE 1 TABLET DAILY  . tamsulosin (FLOMAX) 0.4 MG CAPS capsule Take 1 capsule (0.4 mg total) by mouth daily after supper.     Allergies:   Patient has no known allergies.   Social History   Socioeconomic History  . Marital status: Married    Spouse name: Not on file  . Number of children: 2  . Years of education: Not on file  . Highest education level: Not on file  Occupational History  . Occupation: Retired Therapist, art Engineering geologist)  Tobacco Use  . Smoking status: Former Smoker    Packs/day: 1.00    Types: Cigarettes, Cigars    Quit date: 05/24/1966    Years since quitting: 53.8  . Smokeless tobacco: Former Systems developer    Types: Snuff    Quit date: 05/24/1988  Vaping Use  . Vaping Use: Never used  Substance and Sexual Activity  . Alcohol use: Yes    Alcohol/week: 7.0 - 14.0 standard drinks    Types: 7 - 14 Glasses of wine per week    Comment: occasional beer and wine  . Drug use: No  . Sexual activity: Yes  Other Topics Concern  . Not on  file  Social History Narrative   Household-- pt and wife    Son lives near by, daughter near [place] Strain:   . Difficulty of Paying Living Expenses: Not on file  Food Insecurity:   . Worried About Charity fundraiser in the Last Year: Not on file  . Ran Out of Food in the Last Year: Not on file  Transportation Needs:   . Lack of Transportation (Medical): Not on file  . Lack of Transportation (Non-Medical): Not on file  Physical Activity:   . Days of Exercise per Week: Not on file  . Minutes of Exercise per Session: Not on file  Stress:   . Feeling of Stress : Not on file  Social Connections:   . Frequency of Communication with Friends and Family: Not on file  . Frequency of Social Gatherings with Friends and Family: Not on file  . Attends Religious Services: Not on file  . Active  Member of Clubs or Organizations: Not on file  . Attends Archivist Meetings: Not on file  . Marital Status: Not on file     Family History: The patient's family history includes Dementia in his father; Glaucoma in his mother; Hypertension in his mother; Parkinsonism in his sister; Thyroid cancer in his son. There is no history of Coronary artery disease, Diabetes, Stroke, Prostate cancer, Colon cancer, Esophageal cancer, or Stomach cancer.  ROS:   Please see the history of present illness.    All other systems reviewed and are negative.  EKGs/Labs/Other Studies Reviewed:    The following studies were reviewed today: Prior note, EKG  EKG:  The ekg ordered today demonstrates 2-1 and high degree AV block on rhythm strip.  Recent Labs: 02/28/2020: ALT 13; BUN 18; Creat 1.01; Hemoglobin 12.8; Platelets 255; Potassium 4.5; Sodium 137  Recent Lipid Panel    Component Value Date/Time   CHOL 105 02/28/2020 0918   TRIG 59 02/28/2020 0918   TRIG 74 05/31/2006 0933   HDL 48 02/28/2020 0918   CHOLHDL 2.2 02/28/2020 0918   VLDL 25.6 12/07/2018 0948   LDLCALC 43 02/28/2020 0918    Physical Exam:    VS:  BP 120/62   Pulse (!) 50   Ht 6' (1.829 m)   Wt 199 lb 3.2 oz (90.4 kg)   SpO2 98%   BMI 27.02 kg/m     Wt Readings from Last 3 Encounters:  03/26/20 199 lb 3.2 oz (90.4 kg)  03/18/20 203 lb 9.6 oz (92.4 kg)  02/28/20 194 lb 1 oz (88 kg)     GEN:  Well nourished, well developed in no acute distress HEENT: Normal NECK: No JVD; No carotid bruits LYMPHATICS: No lymphadenopathy CARDIAC: RRR, no murmurs, rubs, gallops RESPIRATORY:  Clear to auscultation without rales, wheezing or rhonchi  ABDOMEN: Soft, non-tender, non-distended MUSCULOSKELETAL:  No edema; No deformity  SKIN: Warm and dry NEUROLOGIC:  Alert and oriented x 3 PSYCHIATRIC:  Normal affect   ASSESSMENT:    1. AV block, 2nd degree   2. PAC (premature atrial contraction)    PLAN:    In order of  problems listed above:  1. Second-degree AV block/high degree AV block He continues to have evidence of high degree AV block despite stopping his calcium channel blocker and beta-blocker.  Now off this medications he is having symptomatic PACs.  Plan to proceed with dual-chamber permanent pacemaker implant so that we can safely  restart his calcium channel blocker and beta-blocker to control his PACs.  Risks and benefits of pacemaker were explained in detail to the patient and his wife today and they are agreeable to proceed.  We will get an echocardiogram prior to the procedure to confirm normal ejection fraction.  Risks, benefits, alternatives to PPM implantation were discussed in detail with the patient today. The patient understands that the risks include but are not limited to bleeding, infection, pneumothorax, perforation, tamponade, vascular damage, renal failure, MI, stroke, death, and lead dislodgement and wishes to proceed.  We will therefore schedule device implantation at the next available time.  2.  Symptomatic PACs Have worsened since stopping the calcium channel blocker and beta-blocker.  Plan to restart these medications once the permanent pacemaker is implanted.   Medication Adjustments/Labs and Tests Ordered: Current medicines are reviewed at length with the patient today.  Concerns regarding medicines are outlined above.  Orders Placed This Encounter  Procedures  . EKG 12-Lead  . ECHOCARDIOGRAM COMPLETE   No orders of the defined types were placed in this encounter.    Signed, Lars Mage, MD, [facility]  03/26/2020 2:16 PM    Electrophysiology [facility]

## 2020-03-26 NOTE — Patient Instructions (Addendum)
Medication Instructions:  Your physician recommends that you continue on your current medications as directed. Please refer to the Current Medication list given to you today.  Labwork: You will get lab work today:  BMP and CBC  Testing/Procedures: Your physician has requested that you have an echocardiogram. Echocardiography is a painless test that uses sound waves to create images of your heart. It provides your doctor with information about the size and shape of your heart and how well your heart's chambers and valves are working. This procedure takes approximately one hour. There are no restrictions for this procedure.  Please schedule for ECHO prior to April 03, 2020  Follow-Up:  SEE INSTRUCTION LETTER  Any Other Special Instructions Will Be Listed Below (If Applicable).  If you need a refill on your cardiac medications before your next appointment, please call your pharmacy.    Pacemaker Implantation, Adult Pacemaker implantation is a procedure to place a pacemaker inside your chest. A pacemaker is a small computer that sends electrical signals to the heart and helps your heart beat normally. A pacemaker also stores information about your heart rhythms. You may need pacemaker implantation if you:  Have a slow heartbeat (bradycardia).  Faint (syncope).  Have shortness of breath (dyspnea) due to heart problems. The pacemaker attaches to your heart through a wire, called a lead. Sometimes just one lead is needed. Other times, there will be two leads. There are two types of pacemakers:  Transvenous pacemaker. This type is placed under the skin or muscle of your chest. The lead goes through a vein in the chest area to reach the inside of the heart.  Epicardial pacemaker. This type is placed under the skin or muscle of your chest or belly. The lead goes through your chest to the outside of the heart. Tell a health care provider about:  Any allergies you have.  All medicines you  are taking, including vitamins, herbs, eye drops, creams, and over-the-counter medicines.  Any problems you or family members have had with anesthetic medicines.  Any blood or bone disorders you have.  Any surgeries you have had.  Any medical conditions you have.  Whether you are pregnant or may be pregnant. What are the risks? Generally, this is a safe procedure. However, problems may occur, including:  Infection.  Bleeding.  Failure of the pacemaker or the lead.  Collapse of a lung or bleeding into a lung.  Blood clot inside a blood vessel with a lead.  Damage to the heart.  Infection inside the heart (endocarditis).  Allergic reactions to medicines. What happens before the procedure? Staying hydrated Follow instructions from your health care provider about hydration, which may include:  Up to 2 hours before the procedure - you may continue to drink clear liquids, such as water, clear fruit juice, black coffee, and plain tea. Eating and drinking restrictions Follow instructions from your health care provider about eating and drinking, which may include:  8 hours before the procedure - stop eating heavy meals or foods such as meat, fried foods, or fatty foods.  6 hours before the procedure - stop eating light meals or foods, such as toast or cereal.  6 hours before the procedure - stop drinking milk or drinks that contain milk.  2 hours before the procedure - stop drinking clear liquids. Medicines  Ask your health care provider about: ? Changing or stopping your regular medicines. This is especially important if you are taking diabetes medicines or blood thinners. ? Taking  medicines such as aspirin and ibuprofen. These medicines can thin your blood. Do not take these medicines before your procedure if your health care provider instructs you not to.  You may be given antibiotic medicine to help prevent infection. General instructions  You will have a heart  evaluation. This may include an electrocardiogram (ECG), chest X-ray, and heart imaging (echocardiogram,  or echo) tests.  You will have blood tests.  Do not use any products that contain nicotine or tobacco, such as cigarettes and e-cigarettes. If you need help quitting, ask your health care provider.  Plan to have someone take you home from the hospital or clinic.  If you will be going home right after the procedure, plan to have someone with you for 24 hours.  Ask your health care provider how your surgical site will be marked or identified. What happens during the procedure?  To reduce your risk of infection: ? Your health care team will wash or sanitize their hands. ? Your skin will be washed with soap. ? Hair may be removed from the surgical area.  An IV tube will be inserted into one of your veins.  You will be given one or more of the following: ? A medicine to help you relax (sedative). ? A medicine to numb the area (local anesthetic). ? A medicine to make you fall asleep (general anesthetic).  If you are getting a transvenous pacemaker: ? An incision will be made in your upper chest. ? A pocket will be made for the pacemaker. It may be placed under the skin or between layers of muscle. ? The lead will be inserted into a blood vessel that returns to the heart. ? While X-rays are taken by an imaging machine (fluoroscopy), the lead will be advanced through the vein to the inside of your heart. ? The other end of the lead will be tunneled under the skin and attached to the pacemaker.  If you are getting an epicardial pacemaker: ? An incision will be made near your ribs or breastbone (sternum) for the lead. ? The lead will be attached to the outside of your heart. ? Another incision will be made in your chest or upper belly to create a pocket for the pacemaker. ? The free end of the lead will be tunneled under the skin and attached to the pacemaker.  The transvenous or  epicardial pacemaker will be tested. Imaging studies may be done to check the lead position.  The incisions will be closed with stitches (sutures), adhesive strips, or skin glue.  Bandages (dressing) will be placed over the incisions. The procedure may vary among health care providers and hospitals. What happens after the procedure?  Your blood pressure, heart rate, breathing rate, and blood oxygen level will be monitored until the medicines you were given have worn off.  You will be given antibiotics and pain medicine.  ECG and chest x-rays will be done.  You will wear a continuous type of ECG (Holter monitor) to check your heart rhythm.  Your health care provider will program the pacemaker.  Do not drive for 24 hours if you received a sedative. This information is not intended to replace advice given to you by your health care provider. Make sure you discuss any questions you have with your health care provider. Document Revised: 01/27/2018 Document Reviewed: 10/22/2015 Elsevier Patient Education  [place].

## 2020-03-27 LAB — CBC WITH DIFFERENTIAL/PLATELET
Basophils Absolute: 0.1 10*3/uL (ref 0.0–0.2)
Basos: 1 %
EOS (ABSOLUTE): 0.2 10*3/uL (ref 0.0–0.4)
Eos: 2 %
Hematocrit: 35.6 % — ABNORMAL LOW (ref 37.5–51.0)
Hemoglobin: 11.8 g/dL — ABNORMAL LOW (ref 13.0–17.7)
Immature Grans (Abs): 0 10*3/uL (ref 0.0–0.1)
Immature Granulocytes: 0 %
Lymphocytes Absolute: 1.6 10*3/uL (ref 0.7–3.1)
Lymphs: 20 %
MCH: 31.6 pg (ref 26.6–33.0)
MCHC: 33.1 g/dL (ref 31.5–35.7)
MCV: 95 fL (ref 79–97)
Monocytes Absolute: 0.7 10*3/uL (ref 0.1–0.9)
Monocytes: 9 %
Neutrophils Absolute: 5.7 10*3/uL (ref 1.4–7.0)
Neutrophils: 68 %
Platelets: 279 10*3/uL (ref 150–450)
RBC: 3.74 x10E6/uL — ABNORMAL LOW (ref 4.14–5.80)
RDW: 12.4 % (ref 11.6–15.4)
WBC: 8.2 10*3/uL (ref 3.4–10.8)

## 2020-03-27 LAB — BASIC METABOLIC PANEL
BUN/Creatinine Ratio: 15 (ref 10–24)
BUN: 16 mg/dL (ref 8–27)
CO2: 24 mmol/L (ref 20–29)
Calcium: 9.9 mg/dL (ref 8.6–10.2)
Chloride: 102 mmol/L (ref 96–106)
Creatinine, Ser: 1.1 mg/dL (ref 0.76–1.27)
GFR calc Af Amer: 72 mL/min/{1.73_m2} (ref >59)
GFR calc non Af Amer: 63 mL/min/{1.73_m2} (ref >59)
Glucose: 114 mg/dL — ABNORMAL HIGH (ref 65–99)
Potassium: 4.3 mmol/L (ref 3.5–5.2)
Sodium: 140 mmol/L (ref 134–144)

## 2020-03-31 ENCOUNTER — Other Ambulatory Visit: Payer: Self-pay

## 2020-03-31 ENCOUNTER — Ambulatory Visit (HOSPITAL_COMMUNITY): Payer: Medicare Other | Attending: Cardiology

## 2020-03-31 DIAGNOSIS — I441 Atrioventricular block, second degree: Secondary | ICD-10-CM | POA: Diagnosis not present

## 2020-03-31 DIAGNOSIS — I491 Atrial premature depolarization: Secondary | ICD-10-CM

## 2020-03-31 LAB — ECHOCARDIOGRAM COMPLETE
Area-P 1/2: 2.99 cm2
S' Lateral: 3.3 cm

## 2020-04-01 ENCOUNTER — Other Ambulatory Visit (HOSPITAL_COMMUNITY)
Admission: RE | Admit: 2020-04-01 | Discharge: 2020-04-01 | Disposition: A | Discharge status: Home / Self Care | Payer: Medicare Other | Source: Ambulatory Visit | Attending: Cardiology | Admitting: Cardiology

## 2020-04-01 DIAGNOSIS — Z20822 Contact with and (suspected) exposure to covid-19: Secondary | ICD-10-CM | POA: Diagnosis not present

## 2020-04-01 DIAGNOSIS — Z01812 Encounter for preprocedural laboratory examination: Secondary | ICD-10-CM | POA: Diagnosis not present

## 2020-04-01 LAB — SARS CORONAVIRUS 2 (TAT 6-24 HRS): SARS Coronavirus 2: NEGATIVE

## 2020-04-02 ENCOUNTER — Telehealth: Payer: Self-pay | Admitting: Internal Medicine

## 2020-04-02 NOTE — Telephone Encounter (Signed)
Patient called today in reference to someone calling his home about medicare insurance enrollment , per patient the insurance agent was told him our office asked them to call to enoroll patient. Patient upset and believes his personal medical information has been leaked.

## 2020-04-02 NOTE — Progress Notes (Signed)
Instructed patient on the following items: Arrival time 0830 Nothing to eat or drink after midnight No meds AM of procedure Responsible person to drive you home and stay with you for 24 hrs Wash with special soap night before and morning of procedure  

## 2020-04-03 ENCOUNTER — Ambulatory Visit (HOSPITAL_COMMUNITY): Payer: Medicare Other

## 2020-04-03 ENCOUNTER — Ambulatory Visit (HOSPITAL_COMMUNITY)
Admission: RE | Admit: 2020-04-03 | Discharge: 2020-04-03 | Disposition: A | Discharge status: Home / Self Care | Payer: Medicare Other | Source: Ambulatory Visit | Attending: Cardiology | Admitting: Cardiology

## 2020-04-03 ENCOUNTER — Other Ambulatory Visit: Payer: Self-pay

## 2020-04-03 ENCOUNTER — Encounter (HOSPITAL_COMMUNITY)
Admission: RE | Disposition: A | Discharge status: Home / Self Care | Payer: Self-pay | Source: Ambulatory Visit | Attending: Cardiology

## 2020-04-03 DIAGNOSIS — I491 Atrial premature depolarization: Secondary | ICD-10-CM | POA: Insufficient documentation

## 2020-04-03 DIAGNOSIS — Z79899 Other long term (current) drug therapy: Secondary | ICD-10-CM | POA: Insufficient documentation

## 2020-04-03 DIAGNOSIS — I441 Atrioventricular block, second degree: Secondary | ICD-10-CM | POA: Diagnosis not present

## 2020-04-03 DIAGNOSIS — I442 Atrioventricular block, complete: Secondary | ICD-10-CM | POA: Diagnosis not present

## 2020-04-03 DIAGNOSIS — Z8249 Family history of ischemic heart disease and other diseases of the circulatory system: Secondary | ICD-10-CM | POA: Diagnosis not present

## 2020-04-03 DIAGNOSIS — Z7984 Long term (current) use of oral hypoglycemic drugs: Secondary | ICD-10-CM | POA: Diagnosis not present

## 2020-04-03 DIAGNOSIS — Z87891 Personal history of nicotine dependence: Secondary | ICD-10-CM | POA: Insufficient documentation

## 2020-04-03 DIAGNOSIS — Z950 Presence of cardiac pacemaker: Secondary | ICD-10-CM | POA: Diagnosis not present

## 2020-04-03 HISTORY — PX: PACEMAKER IMPLANT: EP1218

## 2020-04-03 LAB — GLUCOSE, CAPILLARY
Glucose-Capillary: 113 mg/dL — ABNORMAL HIGH (ref 70–99)
Glucose-Capillary: 98 mg/dL (ref 70–99)

## 2020-04-03 SURGERY — PACEMAKER IMPLANT

## 2020-04-03 MED ORDER — CHLORHEXIDINE GLUCONATE 4 % EX LIQD: 4.0000 "application " | Freq: Once | CUTANEOUS | Status: DC

## 2020-04-03 MED ORDER — SODIUM CHLORIDE 0.9 % IV SOLN
80.0000 mg | INTRAVENOUS | Status: AC
  Administered 2020-04-03: 80 mg

## 2020-04-03 MED ORDER — CEFAZOLIN SODIUM-DEXTROSE 2-4 GM/100ML-% IV SOLN
INTRAVENOUS | Status: AC
  Filled 2020-04-03: qty 100

## 2020-04-03 MED ORDER — MIDAZOLAM HCL 5 MG/5ML IJ SOLN
INTRAMUSCULAR | Status: AC
  Filled 2020-04-03: qty 5

## 2020-04-03 MED ORDER — LIDOCAINE HCL (PF) 1 % IJ SOLN
INTRAMUSCULAR | Status: AC
  Filled 2020-04-03: qty 60

## 2020-04-03 MED ORDER — ACETAMINOPHEN 325 MG PO TABS: 325.0000 mg | ORAL_TABLET | ORAL | Status: DC | PRN

## 2020-04-03 MED ORDER — FENTANYL CITRATE (PF) 100 MCG/2ML IJ SOLN
INTRAMUSCULAR | Status: AC
  Filled 2020-04-03: qty 2

## 2020-04-03 MED ORDER — LIDOCAINE HCL (PF) 1 % IJ SOLN
INTRAMUSCULAR | Status: DC | PRN
  Administered 2020-04-03: 60 mL

## 2020-04-03 MED ORDER — SODIUM CHLORIDE 0.9 % IV SOLN: INTRAVENOUS | Status: DC

## 2020-04-03 MED ORDER — FENTANYL CITRATE (PF) 100 MCG/2ML IJ SOLN
INTRAMUSCULAR | Status: DC | PRN
  Administered 2020-04-03 (×2): 25 ug via INTRAVENOUS

## 2020-04-03 MED ORDER — HEPARIN (PORCINE) IN NACL 1000-0.9 UT/500ML-% IV SOLN
INTRAVENOUS | Status: DC | PRN
  Administered 2020-04-03: 500 mL

## 2020-04-03 MED ORDER — SODIUM CHLORIDE 0.9 % IV SOLN
INTRAVENOUS | Status: AC
  Filled 2020-04-03: qty 2

## 2020-04-03 MED ORDER — MIDAZOLAM HCL 5 MG/5ML IJ SOLN
INTRAMUSCULAR | Status: DC | PRN
  Administered 2020-04-03 (×2): 1 mg via INTRAVENOUS

## 2020-04-03 MED ORDER — CEFAZOLIN SODIUM-DEXTROSE 2-4 GM/100ML-% IV SOLN
2.0000 g | INTRAVENOUS | Status: AC
  Administered 2020-04-03: 2 g via INTRAVENOUS

## 2020-04-03 MED ORDER — ONDANSETRON HCL 4 MG/2ML IJ SOLN: 4.0000 mg | Freq: Four times a day (QID) | INTRAMUSCULAR | Status: DC | PRN

## 2020-04-03 MED ORDER — HEPARIN (PORCINE) IN NACL 1000-0.9 UT/500ML-% IV SOLN
INTRAVENOUS | Status: AC
  Filled 2020-04-03: qty 500

## 2020-04-03 SURGICAL SUPPLY — 9 items
CABLE SURGICAL S-101-97-12 (CABLE) ×2 IMPLANT
COVER DOME SNAP 22 D (MISCELLANEOUS) ×1 IMPLANT
LEAD TENDRIL MRI 52CM LPA1200M (Lead) ×1 IMPLANT
LEAD TENDRIL MRI 58CM LPA1200M (Lead) ×1 IMPLANT
PACEMAKER ASSURITY DR-RF (Pacemaker) ×1 IMPLANT
PAD PRO RADIOLUCENT 2001M-C (PAD) ×2 IMPLANT
SHEATH 8FR PRELUDE SNAP 13 (SHEATH) ×2 IMPLANT
SHEATH PROBE COVER 6X72 (BAG) ×1 IMPLANT
TRAY PACEMAKER INSERTION (PACKS) ×2 IMPLANT

## 2020-04-03 NOTE — Interval H&P Note (Signed)
History and Physical Interval Note:  04/03/2020 10:30 AM  [name]  has presented today for surgery, with the diagnosis of bradycadia.  The various methods of treatment have been discussed with the patient and family. After consideration of risks, benefits and other options for treatment, the patient has consented to  Procedure(s): PACEMAKER IMPLANT (N/A) as a surgical intervention.  The patient's history has been reviewed, patient examined, no change in status, stable for surgery.  I have reviewed the patient's chart and labs.  Questions were answered to the patient's satisfaction.     Kaye Luoma T Jillane Po

## 2020-04-03 NOTE — Progress Notes (Signed)
Discharge instructions reviewed with pt and his wife (via telephone) Both voice understanding.  

## 2020-04-03 NOTE — Discharge Instructions (Signed)
° ° °  Supplemental Discharge Instructions for  Pacemaker/Defibrillator Patients  Tomorrow, 04/04/2020, send in a device transmission  Remove gauze and Tegaderm dressings in the AM. Do NOT to remove the Steri-Strips. Apply betadine swabs over Steri-Strips.  Activity No heavy lifting or vigorous activity with your left/right arm for 6 to 8 weeks.  Do not raise your left/right arm above your head for one week.  Gradually raise your affected arm as drawn below.             04/07/2020               04/08/2020              04/09/2020             04/10/2020 __  NO DRIVING for 1 week  ; you may begin driving on  97/53/0051 .  WOUND CARE - Keep the wound area clean and dry.  Do not get this area wet , no showers for one week; you may shower on 11/182021 . - Tomorrow, 04/04/2020, remove the arm sling - Tomorrow, 04/04/2020 remove the outer plastic bandage.  Underneath the plastic bandage there are steri strips (paper tapes), DO NOT remove these. The tape/steri-strips on your wound will fall off; do not pull them off.  No bandage is needed on the site.  DO  NOT apply any creams, oils, or ointments to the wound area. - If you notice any drainage or discharge from the wound, any swelling or bruising at the site, or you develop a fever > 101? F after you are discharged home, call the office at once.  Special Instructions - You are still able to use cellular telephones; use the ear opposite the side where you have your pacemaker/defibrillator.  Avoid carrying your cellular phone near your device. - When traveling through airports, show security personnel your identification card to avoid being screened in the metal detectors.  Ask the security personnel to use the hand wand. - Avoid arc welding equipment, MRI testing (magnetic resonance imaging), TENS units (transcutaneous nerve stimulators).  Call the office for questions about other devices. - Avoid electrical appliances that are in poor condition or  are not properly grounded. - Microwave ovens are safe to be near or to operate.

## 2020-04-04 ENCOUNTER — Encounter (HOSPITAL_COMMUNITY): Payer: Self-pay | Admitting: Cardiology

## 2020-04-07 ENCOUNTER — Other Ambulatory Visit (HOSPITAL_COMMUNITY): Payer: Self-pay | Admitting: Cardiology

## 2020-04-15 ENCOUNTER — Ambulatory Visit (INDEPENDENT_AMBULATORY_CARE_PROVIDER_SITE_OTHER): Payer: Medicare Other | Admitting: Emergency Medicine

## 2020-04-15 ENCOUNTER — Other Ambulatory Visit: Payer: Self-pay

## 2020-04-15 DIAGNOSIS — Z950 Presence of cardiac pacemaker: Secondary | ICD-10-CM

## 2020-04-15 DIAGNOSIS — I442 Atrioventricular block, complete: Secondary | ICD-10-CM | POA: Diagnosis not present

## 2020-04-15 LAB — CUP PACEART INCLINIC DEVICE CHECK
Battery Remaining Longevity: 140 mo
Battery Voltage: 3.1 V
Brady Statistic RA Percent Paced: 0 %
Brady Statistic RV Percent Paced: 0.05 %
Date Time Interrogation Session: 20211123151051
Implantable Lead Implant Date: 20211111
Implantable Lead Implant Date: 20211111
Implantable Lead Location: 753859
Implantable Lead Location: 753860
Implantable Pulse Generator Implant Date: 20211111
Lead Channel Impedance Value: 512.5 Ohm
Lead Channel Impedance Value: 550 Ohm
Lead Channel Pacing Threshold Amplitude: 0.5 V
Lead Channel Pacing Threshold Amplitude: 0.75 V
Lead Channel Pacing Threshold Pulse Width: 0.4 ms
Lead Channel Pacing Threshold Pulse Width: 0.4 ms
Lead Channel Sensing Intrinsic Amplitude: 12 mV
Lead Channel Sensing Intrinsic Amplitude: 2.2 mV
Lead Channel Setting Pacing Amplitude: 3.5 V
Lead Channel Setting Pacing Amplitude: 3.5 V
Lead Channel Setting Pacing Pulse Width: 0.4 ms
Lead Channel Setting Sensing Sensitivity: 2 mV
Pulse Gen Model: 2272
Pulse Gen Serial Number: 3859811

## 2020-04-15 NOTE — Progress Notes (Signed)
Wound check appointment. Steri-strips removed. Wound without redness or edema. Incision edges approximated, wound well healed. Visible sutures distal to incision and lateral to incision clipped. Normal device function. Thresholds, sensing, and impedances consistent with implant measurements. Device programmed at 3.5V/auto capture programmed on for extra safety margin until 3 month visit. Histogram distribution appropriate for patient and level of activity. No mode switches or high ventricular rates noted. Patient educated about wound care, arm mobility, lifting restrictions. ROV with implanting physician 07/09/19.Patient enrolled in remote monitoring and next remote scheduled for 07/09/19.

## 2020-04-24 DIAGNOSIS — L84 Corns and callosities: Secondary | ICD-10-CM | POA: Diagnosis not present

## 2020-04-24 DIAGNOSIS — B351 Tinea unguium: Secondary | ICD-10-CM | POA: Diagnosis not present

## 2020-04-24 DIAGNOSIS — E1351 Other specified diabetes mellitus with diabetic peripheral angiopathy without gangrene: Secondary | ICD-10-CM | POA: Diagnosis not present

## 2020-04-29 ENCOUNTER — Ambulatory Visit (INDEPENDENT_AMBULATORY_CARE_PROVIDER_SITE_OTHER): Payer: Medicare Other | Admitting: Internal Medicine

## 2020-04-29 ENCOUNTER — Other Ambulatory Visit: Payer: Self-pay

## 2020-04-29 VITALS — BP 143/74 | HR 92 | Temp 97.6°F | Ht 72.0 in | Wt 196.0 lb

## 2020-04-29 DIAGNOSIS — B029 Zoster without complications: Secondary | ICD-10-CM | POA: Diagnosis not present

## 2020-04-29 MED ORDER — VALACYCLOVIR HCL 1 G PO TABS: 1000.0000 mg | ORAL_TABLET | Freq: Three times a day (TID) | ORAL | 0 refills | Status: DC

## 2020-04-29 NOTE — Progress Notes (Signed)
Subjective:    Patient ID: Chase Martinez, male    DOB: 03-May-1941, 79 y.o.   MRN: [id number]  DOS:  04/29/2020 Type of visit - description: Acute Developed itchy rash on the right chest a week ago, yesterday he noted new blisters on the back. Denies any pain.  Also, recently got a pacemaker, chart reviewed. He is doing well, denies chest pain or difficulty breathing.  Able to walk and play golf without problems.   Review of Systems See above   Past Medical History:  Diagnosis Date  . Basal cell carcinoma    dr Ronnald Ramp  . Diabetes mellitus   . Glaucoma   . Hyperlipidemia   . Hypertension   . 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 Premature atrial contractions     Past Surgical History:  Procedure Laterality Date  . CATARACT EXTRACTION Bilateral 2015  . PACEMAKER IMPLANT N/A 04/03/2020   Procedure: PACEMAKER IMPLANT;  Surgeon: Vickie Epley, MD;  Location: [facility];  Service: Cardiovascular;  Laterality: N/A;  . PILONIDAL CYST EXCISION    . REFRACTIVE SURGERY    . SHOULDER SURGERY     right  . TONSILLECTOMY      Allergies as of 04/29/2020   No Known Allergies     Medication List       Accurate as of April 29, 2020 11:59 PM. If you have any questions, ask your nurse or doctor.        atorvastatin 20 MG tablet Commonly known as: LIPITOR TAKE 1 TABLET DAILY   Fish Oil 1000 MG Caps Take 1,000 mg by mouth daily.   freestyle lancets USE AS DIRECTED TO CHECK BLOOD SUGARS 3 TO 4 TIMES DAILY   FREESTYLE LITE test strip Generic drug: glucose blood USE TO CHECK BLOOD SUGAR NOT MORE THAN TWICE A DAY   furosemide 20 MG tablet Commonly known as: LASIX Take 1 tablet (20 mg total) by mouth daily.   Januvia 100 MG tablet Generic drug: sitaGLIPtin TAKE 1 TABLET DAILY What changed: how much to take   metFORMIN 1000 MG tablet Commonly known as: GLUCOPHAGE Take 1 tablet (1,000 mg total) by  mouth 2 (two) times daily with a meal.   multivitamin per tablet Take 1 tablet by mouth daily.   pioglitazone 30 MG tablet Commonly known as: ACTOS TAKE 1 TABLET DAILY   tamsulosin 0.4 MG Caps capsule Commonly known as: FLOMAX Take 1 capsule (0.4 mg total) by mouth daily after supper.   valACYclovir 1000 MG tablet Commonly known as: Valtrex Take 1 tablet (1,000 mg total) by mouth 3 (three) times daily. Started by: Kathlene November, MD   vitamin C 1000 MG tablet Take 1,000 mg by mouth daily.   Vitamin D3 50 MCG (2000 UT) capsule Take 2,000 Units by mouth daily.          Objective:   Physical Exam Skin:        BP (!) 143/74 (BP Location: Right Arm, Patient Position: Sitting, Cuff Size: Large)   Pulse 92   Temp 97.6 F (36.4 C) (Oral)   Ht 6' (1.829 m)   Wt 196 lb (88.9 kg)   SpO2 97%   BMI 26.58 kg/m  General:   Well developed, NAD, BMI noted. HEENT:  Normocephalic . Face symmetric, atraumatic  Skin: See graphic Neurologic:  alert & oriented X3.  Speech normal, gait appropriate for age and unassisted Psych--  Cognition and  judgment appear intact.  Cooperative with normal attention span and concentration.  Behavior appropriate. No anxious or depressed appearing.      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 Ronnald Ramp Nail dystrophy  Asbestosis dx 2019   PLAN: Shingles: SX c/w shingles, probably a mild case due to previous immunization. He has developed new blisters a day ago, will prescribe Valtrex.  Call if not better, call if he gets worse.  He knows this condition very well, two family members were diagnosed with it before. High degree AV block Since the last visit, develop bradycardia, was seen by cardiology, Dx high degree AVB despite stopping CCB's.  Got a  pacemaker implanted 04/03/2020 and feels well.  RTC by  07-2020    This visit occurred during the SARS-CoV-2 public health emergency.  Safety protocols were in place, including screening questions prior to the visit, additional usage of staff PPE, and extensive cleaning of exam room while observing appropriate contact time as indicated for disinfecting solutions.

## 2020-04-29 NOTE — Patient Instructions (Signed)
Take Valtrex for 1 week as prescribed  If you are not getting better let me know.  Definitely call if you orders.  Schedule a follow-up by March 2022

## 2020-04-30 NOTE — Assessment & Plan Note (Signed)
Shingles: SX c/w shingles, probably a mild case due to previous immunization. He has developed new blisters a day ago, will prescribe Valtrex.  Call if not better, call if he gets worse.  He knows this condition very well, two family members were diagnosed with it before. High degree AV block Since the last visit, develop bradycardia, was seen by cardiology, Dx high degree AVB despite stopping CCB's.  Got a  pacemaker implanted 04/03/2020 and feels well.  RTC by 07-2020

## 2020-05-27 ENCOUNTER — Other Ambulatory Visit: Payer: Self-pay | Admitting: Internal Medicine

## 2020-06-02 ENCOUNTER — Other Ambulatory Visit: Payer: Self-pay | Admitting: Internal Medicine

## 2020-07-03 ENCOUNTER — Ambulatory Visit (INDEPENDENT_AMBULATORY_CARE_PROVIDER_SITE_OTHER): Payer: Medicare Other

## 2020-07-03 DIAGNOSIS — I442 Atrioventricular block, complete: Secondary | ICD-10-CM

## 2020-07-03 DIAGNOSIS — B351 Tinea unguium: Secondary | ICD-10-CM | POA: Diagnosis not present

## 2020-07-03 DIAGNOSIS — M205X1 Other deformities of toe(s) (acquired), right foot: Secondary | ICD-10-CM | POA: Diagnosis not present

## 2020-07-03 DIAGNOSIS — I739 Peripheral vascular disease, unspecified: Secondary | ICD-10-CM | POA: Diagnosis not present

## 2020-07-03 DIAGNOSIS — M2022 Hallux rigidus, left foot: Secondary | ICD-10-CM | POA: Diagnosis not present

## 2020-07-03 DIAGNOSIS — M2021 Hallux rigidus, right foot: Secondary | ICD-10-CM | POA: Diagnosis not present

## 2020-07-03 DIAGNOSIS — E1151 Type 2 diabetes mellitus with diabetic peripheral angiopathy without gangrene: Secondary | ICD-10-CM | POA: Diagnosis not present

## 2020-07-03 DIAGNOSIS — L84 Corns and callosities: Secondary | ICD-10-CM | POA: Diagnosis not present

## 2020-07-03 LAB — CUP PACEART REMOTE DEVICE CHECK
Battery Remaining Longevity: 109 mo
Battery Remaining Percentage: 95.5 %
Battery Voltage: 3.04 V
Brady Statistic AP VP Percent: 1 %
Brady Statistic AP VS Percent: 1 %
Brady Statistic AS VP Percent: 1 %
Brady Statistic AS VS Percent: 99 %
Brady Statistic RA Percent Paced: 1 %
Brady Statistic RV Percent Paced: 1 %
Date Time Interrogation Session: 20220210020012
Implantable Lead Implant Date: 20211111
Implantable Lead Implant Date: 20211111
Implantable Lead Location: 753859
Implantable Lead Location: 753860
Implantable Pulse Generator Implant Date: 20211111
Lead Channel Impedance Value: 490 Ohm
Lead Channel Impedance Value: 530 Ohm
Lead Channel Pacing Threshold Amplitude: 0.5 V
Lead Channel Pacing Threshold Amplitude: 0.75 V
Lead Channel Pacing Threshold Pulse Width: 0.4 ms
Lead Channel Pacing Threshold Pulse Width: 0.4 ms
Lead Channel Sensing Intrinsic Amplitude: 1.7 mV
Lead Channel Sensing Intrinsic Amplitude: 10.6 mV
Lead Channel Setting Pacing Amplitude: 3.5 V
Lead Channel Setting Pacing Amplitude: 3.5 V
Lead Channel Setting Pacing Pulse Width: 0.4 ms
Lead Channel Setting Sensing Sensitivity: 2 mV
Pulse Gen Model: 2272
Pulse Gen Serial Number: 3859811

## 2020-07-08 ENCOUNTER — Other Ambulatory Visit: Payer: Self-pay

## 2020-07-08 ENCOUNTER — Encounter: Payer: Self-pay | Admitting: Cardiology

## 2020-07-08 ENCOUNTER — Ambulatory Visit (INDEPENDENT_AMBULATORY_CARE_PROVIDER_SITE_OTHER): Payer: Medicare Other | Admitting: Cardiology

## 2020-07-08 VITALS — BP 136/62 | HR 79 | Ht 72.0 in | Wt 201.0 lb

## 2020-07-08 DIAGNOSIS — Z950 Presence of cardiac pacemaker: Secondary | ICD-10-CM

## 2020-07-08 DIAGNOSIS — I442 Atrioventricular block, complete: Secondary | ICD-10-CM

## 2020-07-08 DIAGNOSIS — I491 Atrial premature depolarization: Secondary | ICD-10-CM | POA: Diagnosis not present

## 2020-07-08 NOTE — Progress Notes (Signed)
Electrophysiology Office Follow up Visit Note:    Date:  07/08/2020   ID:  Chase Martinez, DOB Jul 18, 1940, MRN [id number]  PCP:  Colon Branch, MD  [facility] Cardiologist:  Fransico Him, MD  [facility] Electrophysiologist:  Vickie Epley, MD    Interval History:    Chase Martinez is a 80 y.o. male who presents for a follow up visit after permanent pacemaker implant April 03, 2020.  Pacemaker was implanted due to symptomatic second-degree AV block.  Pacemaker implant was uncomplicated.  Since the implant, he has been doing well.  He did have an episode of palpitations since the implant but it was self-limited and resolved without any medications.  Past Medical History:  Diagnosis Date  . Basal cell carcinoma    dr Ronnald Ramp  . Diabetes mellitus   . Glaucoma   . Hyperlipidemia   . Hypertension   . 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 Premature atrial contractions     Past Surgical History:  Procedure Laterality Date  . CATARACT EXTRACTION Bilateral 2015  . PACEMAKER IMPLANT N/A 04/03/2020   Procedure: PACEMAKER IMPLANT;  Surgeon: Vickie Epley, MD;  Location: [place] [facility];  Service: Cardiovascular;  Laterality: N/A;  . PILONIDAL CYST EXCISION    . REFRACTIVE SURGERY    . SHOULDER SURGERY     right  . TONSILLECTOMY      Current Medications: Current Meds  Medication Sig  . Ascorbic Acid (VITAMIN C) 1000 MG tablet Take 1,000 mg by mouth daily.   Marland Kitchen atorvastatin (LIPITOR) 20 MG tablet Take 1 tablet (20 mg total) by mouth daily.  . Cholecalciferol (VITAMIN D3) 50 MCG (2000 UT) capsule Take 2,000 Units by mouth daily.   Marland Kitchen FREESTYLE LITE test strip USE TO CHECK BLOOD SUGAR NOT MORE THAN TWICE A DAY  . furosemide (LASIX) 20 MG tablet Take 1 tablet (20 mg total) by mouth daily.  . Lancets (FREESTYLE) lancets USE AS DIRECTED TO CHECK BLOOD SUGARS 3 TO 4 TIMES DAILY  . metFORMIN (GLUCOPHAGE) 1000 MG  tablet Take 1 tablet (1,000 mg total) by mouth 2 (two) times daily with a meal.  . multivitamin (THERAGRAN) per tablet Take 1 tablet by mouth daily.  . Omega-3 Fatty Acids (FISH OIL) 1000 MG CAPS Take 1,000 mg by mouth daily.  . pioglitazone (ACTOS) 30 MG tablet TAKE 1 TABLET DAILY (Patient taking differently: Take 30 mg by mouth daily.)  . sitaGLIPtin (JANUVIA) 100 MG tablet Take 1 tablet (100 mg total) by mouth daily.  . tamsulosin (FLOMAX) 0.4 MG CAPS capsule Take 1 capsule (0.4 mg total) by mouth daily after supper.     Allergies:   Patient has no known allergies.   Social History   Socioeconomic History  . Marital status: Married    Spouse name: Not on file  . Number of children: 2  . Years of education: Not on file  . Highest education level: Not on file  Occupational History  . Occupation: Retired Therapist, art Engineering geologist)  Tobacco Use  . Smoking status: Former Smoker    Packs/day: 1.00    Types: Cigarettes, Cigars    Quit date: 05/24/1966    Years since quitting: 54.1  . Smokeless tobacco: Former Systems developer    Types: Snuff    Quit date: 05/24/1988  Vaping Use  . Vaping Use: Never used  Substance and Sexual Activity  . Alcohol use: Yes    Alcohol/week:  7.0 - 14.0 standard drinks    Types: 7 - 14 Glasses of wine per week    Comment: occasional beer and wine  . Drug use: No  . Sexual activity: Yes  Other Topics Concern  . Not on file  Social History Narrative   Household-- pt and wife    Son lives near by, daughter near [place]   Social Determinants of Health   Financial Resource Strain: Not on file  Food Insecurity: Not on file  Transportation Needs: Not on file  Physical Activity: Not on file  Stress: Not on file  Social Connections: Not on file     Family History: The patient's family history includes Dementia in his father; Glaucoma in his mother; Hypertension in his mother; Parkinsonism in his sister; Thyroid cancer in his son. There is no history of Coronary  artery disease, Diabetes, Stroke, Prostate cancer, Colon cancer, Esophageal cancer, or Stomach cancer.  ROS:   Please see the history of present illness.    All other systems reviewed and are negative.  EKGs/Labs/Other Studies Reviewed:    The following studies were reviewed today:   February 15 in clinic device interrogation personally reviewed Stable lead parameters. Less than 1% atrial ventricular pacing. 1 mode switch episode for what appears to be in SVT/AT  EKG:  The ekg ordered today demonstrates sinus rhythm  Recent Labs: 02/28/2020: ALT 13 03/26/2020: BUN 16; Creatinine, Ser 1.10; Hemoglobin 11.8; Platelets 279; Potassium 4.3; Sodium 140  Recent Lipid Panel    Component Value Date/Time   CHOL 105 02/28/2020 0918   TRIG 59 02/28/2020 0918   TRIG 74 05/31/2006 0933   HDL 48 02/28/2020 0918   CHOLHDL 2.2 02/28/2020 0918   VLDL 25.6 12/07/2018 0948   LDLCALC 43 02/28/2020 0918    Physical Exam:    VS:  BP 136/62   Pulse 79   Ht 6' (1.829 m)   Wt 201 lb (91.2 kg)   SpO2 97%   BMI 27.26 kg/m     Wt Readings from Last 3 Encounters:  07/08/20 201 lb (91.2 kg)  04/29/20 196 lb (88.9 kg)  04/03/20 192 lb (87.1 kg)     GEN:  Well nourished, well developed in no acute distress HEENT: Normal NECK: No JVD; No carotid bruits LYMPHATICS: No lymphadenopathy CARDIAC: RRR, no murmurs, rubs, gallops.  Pacemaker pocket well-healed RESPIRATORY:  Clear to auscultation without rales, wheezing or rhonchi  ABDOMEN: Soft, non-tender, non-distended MUSCULOSKELETAL:  No edema; No deformity  SKIN: Warm and dry NEUROLOGIC:  Alert and oriented x 3 PSYCHIATRIC:  Normal affect   ASSESSMENT:    No diagnosis found. PLAN:    In order of problems listed above:  1. Symptomatic second-degree/complete AV block EKG today shows good conduction.  Pacemaker with low pacing burden. Pacemaker parameters stable since implant. Plan to follow-up in 9 months.  2.  PACs Review of the  pacemaker mode switch episode shows what likely is an atrial tachycardia versus SVT.  I offered to restart his beta-blocker during today's visit but given the low frequency of events, he would like to continue with watchful waiting.  I think this is a very reasonable strategy.  Plan to follow-up in 9 months or sooner as needed.   Medication Adjustments/Labs and Tests Ordered: Current medicines are reviewed at length with the patient today.  Concerns regarding medicines are outlined above.  No orders of the defined types were placed in this encounter.  No orders of the defined types  were placed in this encounter.    Signed, Lars Mage, MD, [facility]  07/08/2020 9:34 AM    Electrophysiology [place]

## 2020-07-08 NOTE — Patient Instructions (Signed)
Medication Instructions:  Your physician recommends that you continue on your current medications as directed. Please refer to the Current Medication list given to you today.  Labwork: None ordered.  Testing/Procedures: None ordered.  Follow-Up: Your physician wants you to follow-up in: 9 months with Dr. Quentin Ore.   You will receive a reminder letter in the mail two months in advance. If you don't receive a letter, please call our office to schedule the follow-up appointment.  Remote monitoring is used to monitor your Pacemaker from home. This monitoring reduces the number of office visits required to check your device to one time per year. It allows [place] to keep an eye on the functioning of your device to ensure it is working properly. You are scheduled for a device check from home on 10/02/2020. You may send your transmission at any time that day. If you have a wireless device, the transmission will be sent automatically. After your physician reviews your transmission, you will receive a postcard with your next transmission date.  Any Other Special Instructions Will Be Listed Below (If Applicable).  If you need a refill on your cardiac medications before your next appointment, please call your pharmacy.

## 2020-07-09 NOTE — Progress Notes (Signed)
Remote pacemaker transmission.   

## 2020-07-15 DIAGNOSIS — H401231 Low-tension glaucoma, bilateral, mild stage: Secondary | ICD-10-CM | POA: Diagnosis not present

## 2020-07-29 ENCOUNTER — Ambulatory Visit (INDEPENDENT_AMBULATORY_CARE_PROVIDER_SITE_OTHER): Payer: Medicare Other | Admitting: Internal Medicine

## 2020-07-29 ENCOUNTER — Other Ambulatory Visit: Payer: Self-pay

## 2020-07-29 ENCOUNTER — Encounter: Payer: Self-pay | Admitting: Internal Medicine

## 2020-07-29 VITALS — BP 136/72 | HR 74 | Temp 98.4°F | Resp 16 | Ht 72.0 in | Wt 198.5 lb

## 2020-07-29 DIAGNOSIS — E1140 Type 2 diabetes mellitus with diabetic neuropathy, unspecified: Secondary | ICD-10-CM | POA: Diagnosis not present

## 2020-07-29 DIAGNOSIS — Z125 Encounter for screening for malignant neoplasm of prostate: Secondary | ICD-10-CM | POA: Diagnosis not present

## 2020-07-29 DIAGNOSIS — Z7189 Other specified counseling: Secondary | ICD-10-CM

## 2020-07-29 DIAGNOSIS — I10 Essential (primary) hypertension: Secondary | ICD-10-CM

## 2020-07-29 LAB — BASIC METABOLIC PANEL
BUN: 20 mg/dL (ref 6–23)
CO2: 28 mEq/L (ref 19–32)
Calcium: 9.6 mg/dL (ref 8.4–10.5)
Chloride: 101 mEq/L (ref 96–112)
Creatinine, Ser: 0.89 mg/dL (ref 0.40–1.50)
GFR: 80.21 mL/min (ref >60.00)
Glucose, Bld: 114 mg/dL — ABNORMAL HIGH (ref 70–99)
Potassium: 4.5 mEq/L (ref 3.5–5.1)
Sodium: 136 mEq/L (ref 135–145)

## 2020-07-29 LAB — HEMOGLOBIN A1C: Hgb A1c MFr Bld: 6 % (ref 4.6–6.5)

## 2020-07-29 NOTE — Patient Instructions (Addendum)
Continue checking your blood pressure and blood sugars GO TO THE LAB : Get the blood work   [place], [place] back for   a checkup in 4 months    Advance Directive  Advance directives are legal documents that allow you to make decisions about your health care and medical treatment in case you become unable to communicate for yourself. Advance directives let your wishes be known to family, friends, and health care providers. Discussing and writing advance directives should happen over time rather than all at once. Advance directives can be changed and updated at any time. There are different types of advance directives, such as:  Medical power of attorney.  Living will.  Do not resuscitate (DNR) order or do not attempt resuscitation (DNAR) order. Health care proxy and medical power of attorney A health care proxy is also called a health care agent. This person is appointed to make medical decisions for you when you are unable to make decisions for yourself. Generally, people ask a trusted friend or family member to act as their proxy and represent their preferences. Make sure you have an agreement with your trusted person to act as your proxy. A proxy may have to make a medical decision on your behalf if your wishes are not known. A medical power of attorney, also called a durable power of attorney for health care, is a legal document that names your health care proxy. Depending on the laws in your state, the document may need to be:  Signed.  Notarized.  Dated.  Copied.  Witnessed.  Incorporated into your medical record. You may also want to appoint a trusted person to manage your money in the event you are unable to do so. This is called a durable power of attorney for finances. It is a separate legal document from the durable power of attorney for health care. You may choose your health care proxy or someone different to act as your agent  in money matters. If you do not appoint a proxy, or there is a concern that the proxy is not acting in your best interest, a court may appoint a guardian to act on your behalf. Living will A living will is a set of instructions that state your wishes about medical care when you cannot express them yourself. Health care providers should keep a copy of your living will in your medical record. You may want to give a copy to family members or friends. To alert caregivers in case of an emergency, you can place a card in your wallet to let them know that you have a living will and where they can find it. A living will is used if you become:  Terminally ill.  Disabled.  Unable to communicate or make decisions. The following decisions should be included in your living will:  To use or not to use life support equipment, such as dialysis machines and breathing machines (ventilators).  Whether you want a DNR or DNAR order. This tells health care providers not to use cardiopulmonary resuscitation (CPR) if breathing or heartbeat stops.  To use or not to use tube feeding.  To be given or not to be given food and fluids.  Whether you want comfort (palliative) care when the goal becomes comfort rather than a cure.  Whether you want to donate your organs and tissues. A living will does not give instructions for distributing your money and property if you should pass  away. DNR or DNAR A DNR or DNAR order is a request not to have CPR in the event that your heart stops beating or you stop breathing. If a DNR or DNAR order has not been made and shared, a health care provider will try to help any patient whose heart has stopped or who has stopped breathing. If you plan to have surgery, talk with your health care provider about how your DNR or DNAR order will be followed if problems occur. What if I do not have an advance directive? Some states assign family decision makers to act on your behalf if you do not  have an advance directive. Each state has its own laws about advance directives. You may want to check with your health care provider, attorney, or state representative about the laws in your state. Summary  Advance directives are legal documents that allow you to make decisions about your health care and medical treatment in case you become unable to communicate for yourself.  The process of discussing and writing advance directives should happen over time. You can change and update advance directives at any time.  Advance directives may include a medical power of attorney, a living will, and a DNR or DNAR order. This information is not intended to replace advice given to you by your health care provider. Make sure you discuss any questions you have with your health care provider. Document Revised: 02/12/2020 Document Reviewed: 02/12/2020 Elsevier Patient Education  2021 [employer].

## 2020-07-29 NOTE — Progress Notes (Unsigned)
Subjective:    Patient ID: Chase Martinez, male    DOB: [birth year], 80 y.o.   MRN: [id number]  DOS:  07/29/2020 Type of visit - description: f/u Since the last office visit is doing well. I reviewed cardiology note. We reviewed together his ambulatory BPs and CBGs. He remains active.   Review of Systems Denies chest pain or difficulty breathing.  He still has occasional palpitations, approximately once a week No nausea, vomiting, no diarrhea or blood in the stool  Past Medical History:  Diagnosis Date  . Basal cell carcinoma    dr Ronnald Ramp  . Diabetes mellitus   . Glaucoma   . Hyperlipidemia   . Hypertension   . 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 Premature atrial contractions     Past Surgical History:  Procedure Laterality Date  . CATARACT EXTRACTION Bilateral 2015  . PACEMAKER IMPLANT N/A 04/03/2020   Procedure: PACEMAKER IMPLANT;  Surgeon: Vickie Epley, MD;  Location: [facility];  Service: Cardiovascular;  Laterality: N/A;  . PILONIDAL CYST EXCISION    . REFRACTIVE SURGERY    . SHOULDER SURGERY     right  . TONSILLECTOMY      Allergies as of 07/29/2020   No Known Allergies     Medication List       Accurate as of July 29, 2020 11:59 PM. If you have any questions, ask your nurse or doctor.        atorvastatin 20 MG tablet Commonly known as: LIPITOR Take 1 tablet (20 mg total) by mouth daily.   Fish Oil 1000 MG Caps Take 1,000 mg by mouth daily.   freestyle lancets USE AS DIRECTED TO CHECK BLOOD SUGARS 3 TO 4 TIMES DAILY   FREESTYLE LITE test strip Generic drug: glucose blood USE TO CHECK BLOOD SUGAR NOT MORE THAN TWICE A DAY   furosemide 20 MG tablet Commonly known as: LASIX Take 1 tablet (20 mg total) by mouth daily.   metFORMIN 1000 MG tablet Commonly known as: GLUCOPHAGE Take 1 tablet (1,000 mg total) by mouth 2 (two) times daily with a meal.   multivitamin per  tablet Take 1 tablet by mouth daily.   pioglitazone 30 MG tablet Commonly known as: ACTOS TAKE 1 TABLET DAILY   sitaGLIPtin 100 MG tablet Commonly known as: Januvia Take 1 tablet (100 mg total) by mouth daily.   tamsulosin 0.4 MG Caps capsule Commonly known as: FLOMAX Take 1 capsule (0.4 mg total) by mouth daily after supper.   vitamin C 1000 MG tablet Take 1,000 mg by mouth daily.   Vitamin D3 50 MCG (2000 UT) capsule Take 2,000 Units by mouth daily.          Objective:   Physical Exam BP 136/72 (BP Location: Left Arm, Patient Position: Sitting, Cuff Size: Normal)   Pulse 74   Temp 98.4 F (36.9 C) (Oral)   Resp 16   Ht 6' (1.829 m)   Wt 198 lb 8 oz (90 kg)   SpO2 97%   BMI 26.92 kg/m  General:   Well developed, NAD, BMI noted.  HEENT:  Normocephalic . Face symmetric, atraumatic Neck: No thyromegaly Lungs:  CTA B Normal respiratory effort, no intercostal retractions, no accessory muscle use. Heart: RRR,  no murmur.  Abdomen:  Not distended, soft, non-tender. No rebound or rigidity.  No bruit Skin: Not pale. Not jaundice Lower extremities: no pretibial edema bilaterally  Neurologic:  alert & oriented X3.  Speech normal, gait appropriate for age and unassisted Psych--  Cognition and judgment appear intact.  Cooperative with normal attention span and concentration.  Behavior appropriate. No anxious or depressed appearing.     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 Ronnald Ramp Nail dystrophy  Asbestosis dx 2019 Shingles 04-2020  PLAN: DM: Continue Metformin, Actos, Januvia, ambulatory CBGs morning on average 100.  Check A1c. HTN: Ambulatory BPs on average in the 130s, heart rates in the 70s.  Currently on Lasix.  Check a BMP AV block, pacemaker Saw cardiology 05/07/2021, on reviewing the data they noted likely atrial  tachycardia versus SVT, they agree not to restart metoprolol for now. Preventive care reviewed RTC 4 months   This visit occurred during the SARS-CoV-2 public health emergency.  Safety protocols were in place, including screening questions prior to the visit, additional usage of staff PPE, and extensive cleaning of exam room while observing appropriate contact time as indicated for disinfecting solutions.

## 2020-07-30 NOTE — Assessment & Plan Note (Signed)
DM: Continue Metformin, Actos, Januvia, ambulatory CBGs morning on average 100.  Check A1c. HTN: Ambulatory BPs on average in the 130s, heart rates in the 70s.  Currently on Lasix.  Check a BMP AV block, pacemaker Saw cardiology 05/07/2021, on reviewing the data they noted likely atrial tachycardia versus SVT, they agree not to restart metoprolol for now. Preventive care reviewed RTC 4 months

## 2020-07-30 NOTE — Assessment & Plan Note (Signed)
-   Tdap 2013 - PNM 23 2009, 2018;   prevnar 2015 - s/p  zostavax and shingrix  - covid vax x 3 - had a Flu shot  -Prostate Ca screening:consitently normal PSAs, d/w pt today, will stop screenings -CCS: Cscope-- Dr Vladimir Faster in 2002 ,another Cscope 3-13, 1 polyp, colonoscopy 07-2016, next in 5 yearsor at thediscretion of pt andPCP -Advance directives discussed

## 2020-08-12 DIAGNOSIS — E039 Hypothyroidism, unspecified: Secondary | ICD-10-CM | POA: Diagnosis not present

## 2020-08-12 DIAGNOSIS — M1990 Unspecified osteoarthritis, unspecified site: Secondary | ICD-10-CM | POA: Diagnosis not present

## 2020-08-12 DIAGNOSIS — I509 Heart failure, unspecified: Secondary | ICD-10-CM | POA: Diagnosis not present

## 2020-08-12 DIAGNOSIS — Z1379 Encounter for other screening for genetic and chromosomal anomalies: Secondary | ICD-10-CM | POA: Diagnosis not present

## 2020-08-22 DIAGNOSIS — Z23 Encounter for immunization: Secondary | ICD-10-CM | POA: Diagnosis not present

## 2020-09-11 DIAGNOSIS — L84 Corns and callosities: Secondary | ICD-10-CM | POA: Diagnosis not present

## 2020-09-11 DIAGNOSIS — B351 Tinea unguium: Secondary | ICD-10-CM | POA: Diagnosis not present

## 2020-09-11 DIAGNOSIS — E1151 Type 2 diabetes mellitus with diabetic peripheral angiopathy without gangrene: Secondary | ICD-10-CM | POA: Diagnosis not present

## 2020-09-18 DIAGNOSIS — L570 Actinic keratosis: Secondary | ICD-10-CM | POA: Diagnosis not present

## 2020-09-18 DIAGNOSIS — L814 Other melanin hyperpigmentation: Secondary | ICD-10-CM | POA: Diagnosis not present

## 2020-09-18 DIAGNOSIS — L821 Other seborrheic keratosis: Secondary | ICD-10-CM | POA: Diagnosis not present

## 2020-09-18 DIAGNOSIS — D1801 Hemangioma of skin and subcutaneous tissue: Secondary | ICD-10-CM | POA: Diagnosis not present

## 2020-09-18 DIAGNOSIS — Z85828 Personal history of other malignant neoplasm of skin: Secondary | ICD-10-CM | POA: Diagnosis not present

## 2020-10-02 ENCOUNTER — Ambulatory Visit (INDEPENDENT_AMBULATORY_CARE_PROVIDER_SITE_OTHER): Payer: Medicare Other

## 2020-10-02 DIAGNOSIS — I442 Atrioventricular block, complete: Secondary | ICD-10-CM

## 2020-10-02 LAB — CUP PACEART REMOTE DEVICE CHECK
Battery Remaining Longevity: 130 mo
Battery Remaining Percentage: 95.5 %
Battery Voltage: 3.02 V
Brady Statistic AP VP Percent: 1 %
Brady Statistic AP VS Percent: 1 %
Brady Statistic AS VP Percent: 1 %
Brady Statistic AS VS Percent: 99 %
Brady Statistic RA Percent Paced: 1 %
Brady Statistic RV Percent Paced: 1 %
Date Time Interrogation Session: 20220512020019
Implantable Lead Implant Date: 20211111
Implantable Lead Implant Date: 20211111
Implantable Lead Location: 753859
Implantable Lead Location: 753860
Implantable Pulse Generator Implant Date: 20211111
Lead Channel Impedance Value: 510 Ohm
Lead Channel Impedance Value: 510 Ohm
Lead Channel Pacing Threshold Amplitude: 0.5 V
Lead Channel Pacing Threshold Amplitude: 1 V
Lead Channel Pacing Threshold Pulse Width: 0.4 ms
Lead Channel Pacing Threshold Pulse Width: 0.4 ms
Lead Channel Sensing Intrinsic Amplitude: 1.3 mV
Lead Channel Sensing Intrinsic Amplitude: 12 mV
Lead Channel Setting Pacing Amplitude: 2 V
Lead Channel Setting Pacing Amplitude: 2.5 V
Lead Channel Setting Pacing Pulse Width: 0.4 ms
Lead Channel Setting Sensing Sensitivity: 2 mV
Pulse Gen Model: 2272
Pulse Gen Serial Number: 3859811

## 2020-10-24 NOTE — Progress Notes (Signed)
Remote pacemaker transmission.   

## 2020-10-27 ENCOUNTER — Telehealth: Payer: Self-pay | Admitting: Internal Medicine

## 2020-10-27 NOTE — Telephone Encounter (Signed)
Copied from [place] [id number]. Topic: Medicare AWV >> Oct 27, 2020  2:55 PM Cher Nakai R wrote: Reason for CRM: Left message for patient to call back and schedule Medicare Annual Wellness Visit (AWV) in office.   If not able to come in office, please offer to do virtually or by telephone.   Last AWV: 03/13/2019  Please schedule at anytime with [place].  If any questions, please contact me at [phone]

## 2020-11-03 NOTE — Progress Notes (Signed)
Subjective:   Chase Martinez is a 79 y.o. male who presents for Medicare Annual/Subsequent preventive examination.  I connected with Marcus today by telephone and verified that I am speaking with the correct person using two identifiers. Location patient: home Location provider: work Persons participating in the virtual visit: patient, Marine scientist.    I discussed the limitations, risks, security and privacy concerns of performing an evaluation and management service by telephone and the availability of in person appointments. I also discussed with the patient that there may be a patient responsible charge related to this service. The patient expressed understanding and verbally consented to this telephonic visit.    Interactive audio and video telecommunications were attempted between this provider and patient, however failed, due to patient having technical difficulties OR patient did not have access to video capability.  We continued and completed visit with audio only.  Some vital signs may be absent or patient reported.   Time Spent with patient on telephone encounter: 20 minutes   Review of Systems     Cardiac Risk Factors include: advanced age (>35men, >33 women);male gender;dyslipidemia;diabetes mellitus;hypertension     Objective:    Today's Vitals   11/04/20 1056  Weight: 192 lb 3.2 oz (87.2 kg)  Height: 6' (1.829 m)   Body mass index is 26.07 kg/m.  Advanced Directives 11/04/2020 04/03/2020 03/13/2019 03/09/2018 03/02/2017 08/03/2016 11/28/2015  Does Patient Have a Medical Advance Directive? Yes Yes Yes Yes Yes Yes Yes  Type of Paramedic of [place];Living will [place];Living will [place];Living will [place];Living will [place];Living will [place];Living will [place];Living will  Does patient want to make changes to medical  advance directive? - - No - Patient declined - - - -  Copy of [place] in Chart? No - copy requested - Yes - validated most recent copy scanned in chart (See row information) No - copy requested No - copy requested No - copy requested -    Current Medications (verified) Outpatient Encounter Medications as of 11/04/2020  Medication Sig   Ascorbic Acid (VITAMIN C) 1000 MG tablet Take 1,000 mg by mouth daily.    atorvastatin (LIPITOR) 20 MG tablet Take 1 tablet (20 mg total) by mouth daily.   Cholecalciferol (VITAMIN D3) 50 MCG (2000 UT) capsule Take 2,000 Units by mouth daily.    FREESTYLE LITE test strip USE TO CHECK BLOOD SUGAR NOT MORE THAN TWICE A DAY   furosemide (LASIX) 20 MG tablet Take 1 tablet (20 mg total) by mouth daily.   Lancets (FREESTYLE) lancets USE AS DIRECTED TO CHECK BLOOD SUGARS 3 TO 4 TIMES DAILY   metFORMIN (GLUCOPHAGE) 1000 MG tablet Take 1 tablet (1,000 mg total) by mouth 2 (two) times daily with a meal.   multivitamin (THERAGRAN) per tablet Take 1 tablet by mouth daily.   Omega-3 Fatty Acids (FISH OIL) 1000 MG CAPS Take 1,000 mg by mouth daily.   pioglitazone (ACTOS) 30 MG tablet TAKE 1 TABLET DAILY (Patient taking differently: Take 30 mg by mouth daily.)   sitaGLIPtin (JANUVIA) 100 MG tablet Take 1 tablet (100 mg total) by mouth daily.   tamsulosin (FLOMAX) 0.4 MG CAPS capsule Take 1 capsule (0.4 mg total) by mouth daily after supper.   No facility-administered encounter medications on file as of 11/04/2020.    Allergies (verified) Patient has no known allergies.   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   Premature atrial contractions    Past Surgical History:  Procedure Laterality Date   CATARACT EXTRACTION Bilateral 2015   PACEMAKER  IMPLANT N/A 04/03/2020   Procedure: PACEMAKER IMPLANT;  Surgeon: Vickie Epley, MD;  Location: [facility];  Service: Cardiovascular;  Laterality: N/A;   PILONIDAL CYST EXCISION     REFRACTIVE SURGERY     SHOULDER SURGERY     right   TONSILLECTOMY     Family History  Problem Relation Age of Onset   Hypertension Mother    Glaucoma Mother    Dementia Father    Parkinsonism Sister    Thyroid cancer Son    Coronary artery disease Neg Hx    Diabetes Neg Hx    Stroke Neg Hx    Prostate cancer Neg Hx    Colon cancer Neg Hx    Esophageal cancer Neg Hx    Stomach cancer Neg Hx    Social History   Socioeconomic History   Marital status: Married    Spouse name: Not on file   Number of children: 2   Years of education: Not on file   Highest education level: Not on file  Occupational History   Occupation: Retired Therapist, art Engineering geologist)  Tobacco Use   Smoking status: Former    Packs/day: 1.00    Pack years: 0.00    Types: Cigarettes, Cigars    Quit date: 05/24/1966    Years since quitting: 54.4   Smokeless tobacco: Former    Types: Snuff    Quit date: 05/24/1988  Vaping Use   Vaping Use: Never used  Substance and Sexual Activity   Alcohol use: Yes    Alcohol/week: 7.0 - 14.0 standard drinks    Types: 7 - 14 Glasses of wine per week    Comment: occasional beer and wine   Drug use: No   Sexual activity: Yes  Other Topics Concern   Not on file  Social History Narrative   Household-- pt and wife    Son lives near by, daughter near [place] Resource Strain: Low Risk    Difficulty of Paying Living Expenses: Not hard at all  Food Insecurity: No Food Insecurity   Worried About Charity fundraiser in the Last Year: Never true   Arboriculturist in the Last Year: Never true  Transportation Needs: No Transportation Needs   Lack of Transportation (Medical): No   Lack of Transportation (Non-Medical): No  Physical Activity:  Sufficiently Active   Days of Exercise per Week: 4 days   Minutes of Exercise per Session: 60 min  Stress: No Stress Concern Present   Feeling of Stress : Not at all  Social Connections: Socially Integrated   Frequency of Communication with Friends and Family: More than three times a week   Frequency of Social Gatherings with Friends and Family: More than three times a week   Attends Religious Services: More than 4 times per year   Active Member of [employer] or Organizations: Yes   Attends Music therapist: More than 4 times per year   Marital Status: Married    Tobacco Counseling Counseling given: Not Answered  Clinical Intake:  Pre-visit preparation completed: Yes  Pain : No/denies pain     Nutritional Status: BMI 25 -29 Overweight Nutritional Risks: None Diabetes: Yes CBG done?: No Did pt. bring in CBG monitor from home?: No (phone visit)  How often do you need to have someone help you when you read instructions, pamphlets, or other written materials from your doctor or pharmacy?: 1 - Never  Diabetes:  Is the patient diabetic?  Yes  If diabetic, was a CBG obtained today?  No  Did the patient bring in their glucometer from home?  No phone visit How often do you monitor your CBG's? daily.   Financial Strains and Diabetes Management:  Are you having any financial strains with the device, your supplies or your medication? No .  Does the patient want to be seen by Chronic Care Management for management of their diabetes?  No  Would the patient like to be referred to a Nutritionist or for Diabetic Management?  No   Diabetic Exams:  Diabetic Eye Exam: Completed 02/13/2020   Diabetic Foot Exam: Completed 02/28/2020.  Interpreter Needed?: No  Information entered by :: Caroleen Hamman LPN   Activities of Daily Living In your present state of health, do you have any difficulty performing the following activities: 11/04/2020 07/29/2020  Hearing? N N  Vision? N N   Difficulty concentrating or making decisions? N N  Walking or climbing stairs? N N  Dressing or bathing? N N  Doing errands, shopping? N N  Preparing Food and eating ? N -  Using the Toilet? N -  In the past six months, have you accidently leaked urine? N -  Do you have problems with loss of bowel control? N -  Managing your Medications? N -  Managing your Finances? N -  Housekeeping or managing your Housekeeping? N -  Some recent data might be hidden    Patient Care Team: Colon Branch, MD as PCP - General Sueanne Margarita, MD as PCP - Cardiology (Cardiology) Vickie Epley, MD as PCP - Electrophysiology (Cardiology) Rosemary Holms, DPM as Consulting Physician (Podiatry) Marygrace Drought, MD as Consulting Physician (Ophthalmology)  Indicate any recent Medical Services you may have received from other than [facility] providers in the past year (date may be approximate).     Assessment:   This is a routine wellness examination for [place].  Hearing/Vision screen Hearing Screening - Comments:: No issues Vision Screening - Comments:: Reading glasses Last eye exam-01/2020-Dr. Tanner  Dietary issues and exercise activities discussed: Current Exercise Habits: Home exercise routine, Type of exercise: Other - see comments Film/video editor & yard work), Time (Minutes): 60, Frequency (Times/Week): 3, Weekly Exercise (Minutes/Week): 180, Intensity: Mild, Exercise limited by: None identified   Goals Addressed               This Visit's Progress     Patient Stated     Maintain current health. (pt-stated)   On track      Depression Screen PHQ 2/9 Scores 11/04/2020 04/29/2020 03/13/2019 03/13/2019 03/09/2018 03/02/2017 04/06/2016  PHQ - 2 Score 0 0 0 0 0 0 0    Fall Risk Fall Risk  11/04/2020 04/29/2020 03/13/2019 03/13/2019 12/07/2018  Falls in the past year? 0 0 0 0 0  Number falls in past yr: 0 0 0 - 0  Injury with Fall? 0 0 0 - -  Follow up Falls prevention discussed - - Falls evaluation  completed -    [place]  TO THE HOME:  Any stairs in or around the home? No  Home free of loose throw rugs in walkways, pet beds, electrical cords, etc? Yes  Adequate lighting in your home to reduce risk of falls? Yes   ASSISTIVE DEVICES UTILIZED TO PREVENT FALLS:  Life alert? No  Use of a cane, walker or w/c? No  Grab bars in the bathroom? Yes  Shower chair or bench in shower? No  Elevated toilet seat or a handicapped toilet? No   TIMED UP AND GO:  Was the test performed? No . Phone visit   Cognitive Function:Normal cognitive status assessed by  this Nurse Health Advisor. No abnormalities found.          Immunizations Immunization History  Administered Date(s) Administered   DTaP 06/21/2011   Fluad Quad(high Dose 65+) 02/28/2020   H1N1 06/11/2008   Hepatitis A 12/23/2015   Influenza Split 02/22/2012   Influenza Whole 04/05/2007, 02/13/2008, 02/18/2009, 01/20/2010   Influenza, High Dose Seasonal PF 04/04/2013, 03/02/2017, 03/09/2018, 01/06/2019   Influenza,inj,Quad PF,6+ Mos 03/07/2014   Influenza-Unspecified 03/06/2015, 02/20/2016   PFIZER(Purple Top)SARS-COV-2 Vaccination 06/05/2019, 06/25/2019, 02/18/2020, 08/22/2020   Pneumococcal Conjugate-13 10/30/2013   Pneumococcal Polysaccharide-23 05/24/2002, 09/28/2007, 08/05/2016   Td 05/24/2001, 05/21/2013   Typhoid Inactivated 12/23/2015   Yellow Fever 12/23/2015   Zoster Recombinat (Shingrix) 03/21/2018, 05/25/2018   Zoster, Live 05/24/2002    TDAP status: Up to date  Flu Vaccine status: Up to date  Pneumococcal vaccine status: Up to date  Covid-19 vaccine status: Completed vaccines  Qualifies for Shingles Vaccine? No   Zostavax completed Yes   Shingrix Completed?: Yes  Screening Tests Health Maintenance  Topic Date Due   INFLUENZA VACCINE  12/22/2020   COVID-19 Vaccine (5 - Booster for Pfizer series) 12/22/2020   HEMOGLOBIN A1C  01/29/2021   OPHTHALMOLOGY EXAM  02/12/2021    FOOT EXAM  02/27/2021   URINE MICROALBUMIN  02/27/2021   TETANUS/TDAP  05/22/2023   PNA vac Low Risk Adult  Completed   Zoster Vaccines- Shingrix  Completed   HPV VACCINES  Aged Out    Health Maintenance  There are no preventive care reminders to display for this patient.  Colorectal cancer screening: No longer required.   Lung Cancer Screening: (Low Dose CT Chest recommended if Age 65-80 years, 30 pack-year currently smoking OR have quit w/in 15years.) does not qualify.     Additional Screening:  Hepatitis C Screening: does not qualify  Vision Screening: Recommended annual ophthalmology exams for early detection of glaucoma and other disorders of the eye. Is the patient up to date with their annual eye exam?  Yes  Who is the provider or what is the name of the office in which the patient attends annual eye exams? Dr. Satira Sark   Dental Screening: Recommended annual dental exams for proper oral hygiene  Community Resource Referral / Chronic Care Management: CRR required this visit?  No   CCM required this visit?  No      Plan:     I have personally reviewed and noted the following in the patient's chart:   Medical and social history Use of alcohol, tobacco or illicit drugs  Current medications and supplements including opioid prescriptions. Patient is not currently taking opioid prescriptions. Functional ability and status Nutritional status Physical activity Advanced directives List of other physicians Hospitalizations, surgeries, and ER visits in previous 12 months Vitals Screenings to include cognitive, depression, and falls Referrals and appointments  In addition, I have reviewed and discussed  with patient certain preventive protocols, quality metrics, and best practice recommendations. A written personalized care plan for preventive services as well as general preventive health recommendations were provided to patient.   Due to this being a telephonic visit,  the after visit summary with patients personalized plan was offered to patient via mail or my-chart. Patient would like to access on my-chart.   Marta Antu, LPN   07/12/7586  Nurse Health Advisor  Nurse Notes: None

## 2020-11-04 ENCOUNTER — Ambulatory Visit (INDEPENDENT_AMBULATORY_CARE_PROVIDER_SITE_OTHER): Payer: Medicare Other

## 2020-11-04 VITALS — Ht 72.0 in | Wt 192.2 lb

## 2020-11-04 DIAGNOSIS — Z0000 Encounter for general adult medical examination without abnormal findings: Secondary | ICD-10-CM

## 2020-11-04 NOTE — Patient Instructions (Signed)
[name] , Thank you for taking time to complete your Medicare Wellness Visit. I appreciate your ongoing commitment to your health goals. Please review the following plan we discussed and let me know if I can assist you in the future.   Screening recommendations/referrals: Colonoscopy: No longer required Recommended yearly ophthalmology/optometry visit for glaucoma screening and checkup Recommended yearly dental visit for hygiene and checkup  Vaccinations: Influenza vaccine: Up to date Pneumococcal vaccine: Up to date Tdap vaccine: Up to date-Due 05/22/2023 Shingles vaccine: Completed vaccines   Covid-19: Up to date  Advanced directives: Please bring a copy for your chart  Conditions/risks identified: See problem list  Next appointment: Follow up in one year for your annual wellness visit. 11/10/2021 @ 8:20  Preventive Care 65 Years and Older, Male Preventive care refers to lifestyle choices and visits with your health care provider that can promote health and wellness. What does preventive care include? A yearly physical exam. This is also called an annual well check. Dental exams once or twice a year. Routine eye exams. Ask your health care provider how often you should have your eyes checked. Personal lifestyle choices, including: Daily care of your teeth and gums. Regular physical activity. Eating a healthy diet. Avoiding tobacco and drug use. Limiting alcohol use. Practicing safe sex. Taking low doses of aspirin every day. Taking vitamin and mineral supplements as recommended by your health care provider. What happens during an annual well check? The services and screenings done by your health care provider during your annual well check will depend on your age, overall health, lifestyle risk factors, and family history of disease. Counseling  Your health care provider may ask you questions about your: Alcohol use. Tobacco use. Drug use. Emotional well-being. Home and  relationship well-being. Sexual activity. Eating habits. History of falls. Memory and ability to understand (cognition). Work and work Statistician. Screening  You may have the following tests or measurements: Height, weight, and BMI. Blood pressure. Lipid and cholesterol levels. These may be checked every 5 years, or more frequently if you are over 55 years old. Skin check. Lung cancer screening. You may have this screening every year starting at age 70 if you have a 30-pack-year history of smoking and currently smoke or have quit within the past 15 years. Fecal occult blood test (FOBT) of the stool. You may have this test every year starting at age 51. Flexible sigmoidoscopy or colonoscopy. You may have a sigmoidoscopy every 5 years or a colonoscopy every 10 years starting at age 1. Prostate cancer screening. Recommendations will vary depending on your family history and other risks. Hepatitis C blood test. Hepatitis B blood test. Sexually transmitted disease (STD) testing. Diabetes screening. This is done by checking your blood sugar (glucose) after you have not eaten for a while (fasting). You may have this done every 1-3 years. Abdominal aortic aneurysm (AAA) screening. You may need this if you are a current or former smoker. Osteoporosis. You may be screened starting at age 3 if you are at high risk. Talk with your health care provider about your test results, treatment options, and if necessary, the need for more tests. Vaccines  Your health care provider may recommend certain vaccines, such as: Influenza vaccine. This is recommended every year. Tetanus, diphtheria, and acellular pertussis (Tdap, Td) vaccine. You may need a Td booster every 10 years. Zoster vaccine. You may need this after age 69. Pneumococcal 13-valent conjugate (PCV13) vaccine. One dose is recommended after age 42. Pneumococcal polysaccharide (  PPSV23) vaccine. One dose is recommended after age 104. Talk to your  health care provider about which screenings and vaccines you need and how often you need them. This information is not intended to replace advice given to you by your health care provider. Make sure you discuss any questions you have with your health care provider. Document Released: 06/06/2015 Document Revised: 01/28/2016 Document Reviewed: 03/11/2015 Elsevier Interactive Patient Education  2017 [place] Prevention in the Home Falls can cause injuries. They can happen to people of all ages. There are many things you can do to make your home safe and to help prevent falls. What can I do on the outside of my home? Regularly fix the edges of walkways and driveways and fix any cracks. Remove anything that might make you trip as you walk through a door, such as a raised step or threshold. Trim any bushes or trees on the path to your home. Use bright outdoor lighting. Clear any walking paths of anything that might make someone trip, such as rocks or tools. Regularly check to see if handrails are loose or broken. Make sure that both sides of any steps have handrails. Any raised decks and porches should have guardrails on the edges. Have any leaves, snow, or ice cleared regularly. Use sand or salt on walking paths during winter. Clean up any spills in your garage right away. This includes oil or grease spills. What can I do in the bathroom? Use night lights. Install grab bars by the toilet and in the tub and shower. Do not use towel bars as grab bars. Use non-skid mats or decals in the tub or shower. If you need to sit down in the shower, use a plastic, non-slip stool. Keep the floor dry. Clean up any water that spills on the floor as soon as it happens. Remove soap buildup in the tub or shower regularly. Attach bath mats securely with double-sided non-slip rug tape. Do not have throw rugs and other things on the floor that can make you trip. What can I do in the bedroom? Use night  lights. Make sure that you have a light by your bed that is easy to reach. Do not use any sheets or blankets that are too big for your bed. They should not hang down onto the floor. Have a firm chair that has side arms. You can use this for support while you get dressed. Do not have throw rugs and other things on the floor that can make you trip. What can I do in the kitchen? Clean up any spills right away. Avoid walking on wet floors. Keep items that you use a lot in easy-to-reach places. If you need to reach something above you, use a strong step stool that has a grab bar. Keep electrical cords out of the way. Do not use floor polish or wax that makes floors slippery. If you must use wax, use non-skid floor wax. Do not have throw rugs and other things on the floor that can make you trip. What can I do with my stairs? Do not leave any items on the stairs. Make sure that there are handrails on both sides of the stairs and use them. Fix handrails that are broken or loose. Make sure that handrails are as long as the stairways. Check any carpeting to make sure that it is firmly attached to the stairs. Fix any carpet that is loose or worn. Avoid having throw rugs at the top or  bottom of the stairs. If you do have throw rugs, attach them to the floor with carpet tape. Make sure that you have a light switch at the top of the stairs and the bottom of the stairs. If you do not have them, ask someone to add them for you. What else can I do to help prevent falls? Wear shoes that: Do not have high heels. Have rubber bottoms. Are comfortable and fit you well. Are closed at the toe. Do not wear sandals. If you use a stepladder: Make sure that it is fully opened. Do not climb a closed stepladder. Make sure that both sides of the stepladder are locked into place. Ask someone to hold it for you, if possible. Clearly mark and make sure that you can see: Any grab bars or handrails. First and last  steps. Where the edge of each step is. Use tools that help you move around (mobility aids) if they are needed. These include: Canes. Walkers. Scooters. Crutches. Turn on the lights when you go into a dark area. Replace any light bulbs as soon as they burn out. Set up your furniture so you have a clear path. Avoid moving your furniture around. If any of your floors are uneven, fix them. If there are any pets around you, be aware of where they are. Review your medicines with your doctor. Some medicines can make you feel dizzy. This can increase your chance of falling. Ask your doctor what other things that you can do to help prevent falls. This information is not intended to replace advice given to you by your health care provider. Make sure you discuss any questions you have with your health care provider. Document Released: 03/06/2009 Document Revised: 10/16/2015 Document Reviewed: 06/14/2014 Elsevier Interactive Patient Education  2017 [employer].

## 2020-11-07 ENCOUNTER — Other Ambulatory Visit: Payer: Self-pay | Admitting: Internal Medicine

## 2020-11-18 ENCOUNTER — Other Ambulatory Visit: Payer: Self-pay | Admitting: Internal Medicine

## 2020-11-18 ENCOUNTER — Other Ambulatory Visit: Payer: Self-pay

## 2020-11-18 NOTE — Telephone Encounter (Signed)
[facility] mail order pharmacy is requesting a refill on Metformin. Would Dr. Quentin Ore like to refill this medication? Please address

## 2020-11-24 ENCOUNTER — Other Ambulatory Visit: Payer: Self-pay | Admitting: Internal Medicine

## 2020-11-25 ENCOUNTER — Other Ambulatory Visit: Payer: Self-pay

## 2020-11-25 MED ORDER — METFORMIN HCL 1000 MG PO TABS
1000.0000 mg | ORAL_TABLET | Freq: Two times a day (BID) | ORAL | 1 refills | Status: DC
Start: 2020-11-25 — End: 2021-05-19

## 2020-11-27 ENCOUNTER — Other Ambulatory Visit: Payer: Self-pay | Admitting: Internal Medicine

## 2020-12-02 ENCOUNTER — Ambulatory Visit: Payer: Medicare Other | Admitting: Internal Medicine

## 2020-12-02 ENCOUNTER — Encounter: Payer: Self-pay | Admitting: Internal Medicine

## 2020-12-02 DIAGNOSIS — H524 Presbyopia: Secondary | ICD-10-CM | POA: Diagnosis not present

## 2020-12-02 DIAGNOSIS — Z20822 Contact with and (suspected) exposure to covid-19: Secondary | ICD-10-CM | POA: Diagnosis not present

## 2020-12-02 DIAGNOSIS — H401231 Low-tension glaucoma, bilateral, mild stage: Secondary | ICD-10-CM | POA: Diagnosis not present

## 2020-12-02 DIAGNOSIS — E119 Type 2 diabetes mellitus without complications: Secondary | ICD-10-CM | POA: Diagnosis not present

## 2020-12-02 DIAGNOSIS — H43813 Vitreous degeneration, bilateral: Secondary | ICD-10-CM | POA: Diagnosis not present

## 2020-12-02 LAB — HM DIABETES EYE EXAM

## 2020-12-04 ENCOUNTER — Ambulatory Visit (INDEPENDENT_AMBULATORY_CARE_PROVIDER_SITE_OTHER): Payer: Medicare Other | Admitting: Internal Medicine

## 2020-12-04 ENCOUNTER — Encounter: Payer: Self-pay | Admitting: Internal Medicine

## 2020-12-04 ENCOUNTER — Other Ambulatory Visit: Payer: Self-pay

## 2020-12-04 VITALS — BP 136/70 | HR 65 | Temp 98.0°F | Resp 18 | Ht 72.0 in | Wt 195.1 lb

## 2020-12-04 DIAGNOSIS — D649 Anemia, unspecified: Secondary | ICD-10-CM

## 2020-12-04 DIAGNOSIS — E7800 Pure hypercholesterolemia, unspecified: Secondary | ICD-10-CM

## 2020-12-04 DIAGNOSIS — E1140 Type 2 diabetes mellitus with diabetic neuropathy, unspecified: Secondary | ICD-10-CM | POA: Diagnosis not present

## 2020-12-04 DIAGNOSIS — I10 Essential (primary) hypertension: Secondary | ICD-10-CM

## 2020-12-04 LAB — CBC WITH DIFFERENTIAL/PLATELET
Basophils Absolute: 0 10*3/uL (ref 0.0–0.1)
Basophils Relative: 0.5 % (ref 0.0–3.0)
Eosinophils Absolute: 0.3 10*3/uL (ref 0.0–0.7)
Eosinophils Relative: 4.9 % (ref 0.0–5.0)
HCT: 34 % — ABNORMAL LOW (ref 39.0–52.0)
Hemoglobin: 11.7 g/dL — ABNORMAL LOW (ref 13.0–17.0)
Lymphocytes Relative: 16 % (ref 12.0–46.0)
Lymphs Abs: 1.1 10*3/uL (ref 0.7–4.0)
MCHC: 34.3 g/dL (ref 30.0–36.0)
MCV: 93.8 fl (ref 78.0–100.0)
Monocytes Absolute: 0.5 10*3/uL (ref 0.1–1.0)
Monocytes Relative: 8.1 % (ref 3.0–12.0)
Neutro Abs: 4.7 10*3/uL (ref 1.4–7.7)
Neutrophils Relative %: 70.5 % (ref 43.0–77.0)
Platelets: 263 10*3/uL (ref 150.0–400.0)
RBC: 3.63 Mil/uL — ABNORMAL LOW (ref 4.22–5.81)
RDW: 13.7 % (ref 11.5–15.5)
WBC: 6.7 10*3/uL (ref 4.0–10.5)

## 2020-12-04 LAB — COMPREHENSIVE METABOLIC PANEL
ALT: 13 U/L (ref 0–53)
AST: 20 U/L (ref 0–37)
Albumin: 4.1 g/dL (ref 3.5–5.2)
Alkaline Phosphatase: 57 U/L (ref 39–117)
BUN: 20 mg/dL (ref 6–23)
CO2: 28 mEq/L (ref 19–32)
Calcium: 9.4 mg/dL (ref 8.4–10.5)
Chloride: 101 mEq/L (ref 96–112)
Creatinine, Ser: 0.91 mg/dL (ref 0.40–1.50)
GFR: 78.69 mL/min (ref >60.00)
Glucose, Bld: 99 mg/dL (ref 70–99)
Potassium: 4.6 mEq/L (ref 3.5–5.1)
Sodium: 135 mEq/L (ref 135–145)
Total Bilirubin: 0.7 mg/dL (ref 0.2–1.2)
Total Protein: 6.5 g/dL (ref 6.0–8.3)

## 2020-12-04 LAB — LIPID PANEL
Cholesterol: 114 mg/dL (ref 0–200)
HDL: 50.2 mg/dL (ref >39.00)
LDL Cholesterol: 55 mg/dL (ref 0–99)
NonHDL: 63.87
Total CHOL/HDL Ratio: 2
Triglycerides: 42 mg/dL (ref 0.0–149.0)
VLDL: 8.4 mg/dL (ref 0.0–40.0)

## 2020-12-04 LAB — IRON: Iron: 65 ug/dL (ref 42–165)

## 2020-12-04 LAB — HEMOGLOBIN A1C: Hgb A1c MFr Bld: 6.2 % (ref 4.6–6.5)

## 2020-12-04 LAB — FERRITIN: Ferritin: 76.6 ng/mL (ref 22.0–322.0)

## 2020-12-04 NOTE — Patient Instructions (Signed)
Continue checking your vital signs and blood sugars.  GO TO THE LAB : Get the blood work     [place], [place] back for a checkup in 6 months

## 2020-12-04 NOTE — Progress Notes (Signed)
Subjective:    Patient ID: Chase Martinez, male    DOB: 09-20-1940, 80 y.o.   MRN: [id number]  DOS:  12/04/2020 Type of visit - description: Follow-up Since the last office visit is doing well. Today with talk about diabetes, high cholesterol, hypertension. He has detailed records of his ambulatory BPs and CBGs, they were reviewed. We also talk about colon cancer screening   Review of Systems Denies chest pain or difficulty breathing.  No palpitations No nausea, vomiting, diarrhea.  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 If you have any questions, ask your nurse or doctor.          atorvastatin 20 MG tablet Commonly known as: LIPITOR Take 1 tablet (20 mg total) by mouth daily.   Fish Oil 1000 MG Caps Take 1,000 mg by mouth daily.   freestyle lancets USE AS DIRECTED TO CHECK BLOOD SUGARS 3 TO 4 TIMES DAILY   FREESTYLE LITE test strip Generic drug: glucose blood USE TO CHECK BLOOD SUGAR NOT MORE THAN TWICE A DAY   furosemide 20 MG tablet Commonly known as: LASIX Take 1 tablet (20 mg total) by mouth daily.   metFORMIN 1000 MG tablet Commonly known as: GLUCOPHAGE Take 1 tablet (1,000 mg total) by mouth 2 (two) times daily with  a meal.   multivitamin per tablet Take 1 tablet by mouth daily.   pioglitazone 30 MG tablet Commonly known as: ACTOS TAKE 1 TABLET DAILY   sitaGLIPtin 100 MG tablet Commonly known as: Januvia Take 1 tablet (100 mg total) by mouth daily.   tamsulosin 0.4 MG Caps capsule Commonly known as: FLOMAX Take 1 capsule (0.4 mg total) by mouth daily after supper.   vitamin C 1000 MG tablet Take 1,000 mg by mouth daily.   Vitamin D3 50 MCG (2000 UT) capsule Take 2,000 Units by mouth daily.           Objective:   Physical Exam BP 136/70 (BP Location: Left Arm, Patient Position: Sitting, Cuff Size: Small)   Pulse 65   Temp 98 F (36.7 C) (Oral)   Resp 18   Ht 6' (1.829 m)   Wt 195 lb 2 oz (88.5 kg)   SpO2 98%   BMI 26.46 kg/m  General:   Well developed, NAD, BMI noted. HEENT:  Normocephalic . Face symmetric, atraumatic Lungs:  CTA B Normal respiratory effort, no intercostal retractions, no accessory muscle use. Heart: RRR,  no murmur.  Lower extremities: no pretibial edema bilaterally  Skin: Not pale. Not jaundice Neurologic:  alert & oriented X3.  Speech normal, gait appropriate for age and unassisted Psych--  Cognition and judgment appear intact.  Cooperative with normal attention span and concentration.  Behavior appropriate. No anxious or depressed appearing.      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 Ronnald Ramp Nail dystrophy  Asbestosis dx 2019 Shingles 04-2020  PLAN: DM: On metformin, Actos, Januvia.  Ambulatory CBGs in the low 100s.  Denies sxs of low sugar such as tiredness sweats.  We will check A1c.  He remains very active. HTN: Ambulatory BPs checked frequently, in the 120s over 60s.  Continue Lasix, check a CMP. High cholesterol: On Lipitor, FLP. Cardiovascular: Asymptomatic Mild anemia: Per chart review, no GI  symptoms, check a CBC, iron, ferritin. Preventive care: Up-to-date on vaccines, CCS discussed, not inclined to proceed with a colonoscopy at this point RTC 6 months  This visit occurred during the SARS-CoV-2 public health emergency.  Safety protocols were in place, including screening questions prior to the visit, additional usage of staff PPE, and extensive cleaning of exam room while observing appropriate contact time as indicated for disinfecting solutions.

## 2020-12-05 NOTE — Assessment & Plan Note (Signed)
DM: On metformin, Actos, Januvia.  Ambulatory CBGs in the low 100s.  Denies sxs of low sugar such as tiredness sweats.  We will check A1c.  He remains very active. HTN: Ambulatory BPs checked frequently, in the 120s over 60s.  Continue Lasix, check a CMP. High cholesterol: On Lipitor, FLP. Cardiovascular: Asymptomatic Mild anemia: Per chart review, no GI symptoms, check a CBC, iron, ferritin. Preventive care: Up-to-date on vaccines, CCS discussed, not inclined to proceed with a colonoscopy at this point RTC 6 months

## 2020-12-11 DIAGNOSIS — I739 Peripheral vascular disease, unspecified: Secondary | ICD-10-CM | POA: Diagnosis not present

## 2020-12-11 DIAGNOSIS — L84 Corns and callosities: Secondary | ICD-10-CM | POA: Diagnosis not present

## 2020-12-11 DIAGNOSIS — B351 Tinea unguium: Secondary | ICD-10-CM | POA: Diagnosis not present

## 2020-12-12 ENCOUNTER — Other Ambulatory Visit (HOSPITAL_COMMUNITY)
Admission: RE | Admit: 2020-12-12 | Discharge: 2020-12-12 | Disposition: A | Discharge status: Home / Self Care | Payer: Medicare Other | Source: Ambulatory Visit | Attending: Pulmonary Disease | Admitting: Pulmonary Disease

## 2020-12-12 DIAGNOSIS — Z20822 Contact with and (suspected) exposure to covid-19: Secondary | ICD-10-CM | POA: Diagnosis not present

## 2020-12-12 DIAGNOSIS — Z01812 Encounter for preprocedural laboratory examination: Secondary | ICD-10-CM | POA: Insufficient documentation

## 2020-12-12 LAB — SARS CORONAVIRUS 2 (TAT 6-24 HRS): SARS Coronavirus 2: NEGATIVE

## 2020-12-15 ENCOUNTER — Other Ambulatory Visit: Payer: Self-pay | Admitting: *Deleted

## 2020-12-15 DIAGNOSIS — R0609 Other forms of dyspnea: Secondary | ICD-10-CM

## 2020-12-15 DIAGNOSIS — R0600 Dyspnea, unspecified: Secondary | ICD-10-CM

## 2020-12-16 ENCOUNTER — Ambulatory Visit (INDEPENDENT_AMBULATORY_CARE_PROVIDER_SITE_OTHER): Payer: Medicare Other | Admitting: Pulmonary Disease

## 2020-12-16 ENCOUNTER — Encounter: Payer: Self-pay | Admitting: Pulmonary Disease

## 2020-12-16 ENCOUNTER — Other Ambulatory Visit: Payer: Self-pay

## 2020-12-16 VITALS — BP 120/64 | HR 73 | Temp 97.6°F | Ht 75.0 in | Wt 198.0 lb

## 2020-12-16 DIAGNOSIS — R0609 Other forms of dyspnea: Secondary | ICD-10-CM

## 2020-12-16 DIAGNOSIS — J61 Pneumoconiosis due to asbestos and other mineral fibers: Secondary | ICD-10-CM

## 2020-12-16 DIAGNOSIS — R0600 Dyspnea, unspecified: Secondary | ICD-10-CM | POA: Diagnosis not present

## 2020-12-16 LAB — PULMONARY FUNCTION TEST
DL/VA % pred: 127 %
DL/VA: 4.86 ml/min/mmHg/L
DLCO cor % pred: 136 %
DLCO cor: 36.15 ml/min/mmHg
DLCO unc % pred: 124 %
DLCO unc: 32.8 ml/min/mmHg
FEF 25-75 Post: 3.72 L/sec
FEF 25-75 Pre: 3.8 L/sec
FEF2575-%Change-Post: -2 %
FEF2575-%Pred-Post: 172 %
FEF2575-%Pred-Pre: 175 %
FEV1-%Change-Post: 0 %
FEV1-%Pred-Post: 116 %
FEV1-%Pred-Pre: 115 %
FEV1-Post: 3.68 L
FEV1-Pre: 3.68 L
FEV1FVC-%Change-Post: 4 %
FEV1FVC-%Pred-Pre: 112 %
FEV6-%Change-Post: -4 %
FEV6-%Pred-Post: 105 %
FEV6-%Pred-Pre: 110 %
FEV6-Post: 4.41 L
FEV6-Pre: 4.59 L
FEV6FVC-%Pred-Post: 106 %
FEV6FVC-%Pred-Pre: 106 %
FVC-%Change-Post: -3 %
FVC-%Pred-Post: 98 %
FVC-%Pred-Pre: 102 %
FVC-Post: 4.41 L
FVC-Pre: 4.59 L
Post FEV1/FVC ratio: 83 %
Post FEV6/FVC ratio: 100 %
Pre FEV1/FVC ratio: 80 %
Pre FEV6/FVC Ratio: 100 %
RV % pred: 101 %
RV: 2.9 L
TLC % pred: 98 %
TLC: 7.56 L

## 2020-12-16 NOTE — Addendum Note (Signed)
Addended byCoralie Keens on: 12/16/2020 01:03 PM   Modules accepted: Orders

## 2020-12-16 NOTE — Patient Instructions (Signed)
I am glad you are doing well with regard to your breathing I have reviewed your PFTs which shows an improvement in lung function which is good news Continue to maintain an active lifestyle  We will get a high-resolution CT in 1 year Follow-up in clinic in 1 year for monitoring

## 2020-12-16 NOTE — Progress Notes (Signed)
Chase Martinez    YK:[id number]    04-02-1941  Primary Care Physician:Paz, Alda Berthold, MD  Referring Physician: Colon Branch, MD [address] [place],  [place] [address]  Chief complaint: Follow-up for asbestosis  HPI: 80 year old with history of asbestos exposure, hypertension, hyperlipidemia, diabetes.  He is here for evaluation of asbestos-related lung disease.  Reports significant asbestos exposure while working in [employer] Denies any pulmonary symptoms.  No dyspnea, cough, sputum production, wheezing.  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  Later worked in Sports coach at [employer] Exposures: Reports exposure to asbestos while in [employer].  No mold, hot tub, Jacuzzi. Smoking history: 20-pack-year smoking history.  Quit in 1968 Travel history: Lived in [place], [place], [place].  Recent travel to [place]. Relevant family history: No significant family history of lung issues.  Interim history: Here for review of PFTs.  States that he is doing well with no issues Has an active lifestyle.  Plays golf 3 times a week  Outpatient Encounter Medications as of 12/16/2020  Medication Sig   Ascorbic Acid (VITAMIN C) 1000 MG tablet Take 1,000 mg by mouth daily.    atorvastatin (LIPITOR) 20 MG tablet Take 1 tablet (20 mg total) by mouth daily.   Cholecalciferol (VITAMIN D3) 50 MCG (2000 UT) capsule Take 2,000 Units by mouth daily.    FREESTYLE LITE test strip USE TO CHECK BLOOD SUGAR NOT MORE THAN TWICE A DAY   furosemide (LASIX) 20 MG tablet Take 1 tablet (20 mg total) by mouth daily.   Lancets (FREESTYLE) lancets USE AS DIRECTED TO CHECK BLOOD SUGARS 3 TO 4 TIMES DAILY   metFORMIN (GLUCOPHAGE) 1000 MG tablet Take 1 tablet (1,000 mg total) by mouth 2 (two) times daily with a meal.   multivitamin (THERAGRAN) per tablet Take 1 tablet by mouth daily.   Omega-3 Fatty Acids (FISH OIL) 1000 MG CAPS Take  1,000 mg by mouth daily.   pioglitazone (ACTOS) 30 MG tablet TAKE 1 TABLET DAILY (Patient taking differently: Take 30 mg by mouth daily.)   sitaGLIPtin (JANUVIA) 100 MG tablet Take 1 tablet (100 mg total) by mouth daily.   tamsulosin (FLOMAX) 0.4 MG CAPS capsule Take 1 capsule (0.4 mg total) by mouth daily after supper.   No facility-administered encounter medications on file as of 12/16/2020.   Physical Exam: Blood pressure 120/64, pulse 73, temperature 97.6 F (36.4 C), temperature source Oral, height '6\' 3"'$  (1.905 m), weight 198 lb (89.8 kg), SpO2 98 %. Gen:      No acute distress HEENT:  EOMI, sclera anicteric Neck:     No masses; no thyromegaly Lungs:    Clear to auscultation bilaterally; normal respiratory effort CV:         Regular rate and rhythm; no murmurs Abd:      + bowel sounds; soft, non-tender; no palpable masses, no distension Ext:    No edema; adequate peripheral perfusion Skin:      Warm and dry; no rash Neuro: alert and oriented x 3 Psych: normal mood and affect   Data Reviewed: Imaging: CT chest 07/10/2009- dependent bibasilar atelectasis. High-resolution CT 10/20/17- minimal septal thickening, subpleural reticulation at the bases.  Mild air trapping.  Tiny calcified granulomas, small pleural plaques Hepatic steatosis, coronary atherosclerosis. I reviewed the images personally.  PFTs  11/17/2017 FVC 4.21 [92%), FEV1 3.43 [104%), F/F 81, TLC 91%,  DLCO 75%, DLCO/VA 90% Minimal diffusion defect  12/16/2020 FVC 4.41 [98%], FEV1 3.68 [116%], F/F 83, TLC 7.56 [98%], DLCO 36.15 [136%] Increased diffusion capacity.  Improvement in lung volumes and diffusion capacity compared to 2019  Assessment:  Asbestosis. Review of CT scan shows minimal scarring at the bases and minimal diffusion defect consistent with asbestosis. PFTs today actually show an improvement in lung capacity and diffusion He is asymptomatic and maintaining an active lifestyle and we will continue  monitoring him  Follow-up high-res CT in 1 year  Health maintenance 03/02/2017-influenza 10/30/2013-Prevnar 08/05/2016-Pneumovax  Plan/Recommendations: - High-res CT in 1 year  Marshell Garfinkel MD [facility] 12/16/2020, 10:30 AM  CC: Colon Branch, MD

## 2020-12-16 NOTE — Patient Instructions (Signed)
Full PFT performed today. °

## 2020-12-16 NOTE — Progress Notes (Signed)
Full PFT performed today. °

## 2020-12-22 ENCOUNTER — Other Ambulatory Visit: Payer: Self-pay | Admitting: Internal Medicine

## 2020-12-23 ENCOUNTER — Encounter: Payer: Self-pay | Admitting: Internal Medicine

## 2021-01-01 ENCOUNTER — Ambulatory Visit (INDEPENDENT_AMBULATORY_CARE_PROVIDER_SITE_OTHER): Payer: Medicare Other

## 2021-01-01 DIAGNOSIS — I442 Atrioventricular block, complete: Secondary | ICD-10-CM | POA: Diagnosis not present

## 2021-01-01 LAB — CUP PACEART REMOTE DEVICE CHECK
Battery Remaining Longevity: 124 mo
Battery Remaining Percentage: 95.5 %
Battery Voltage: 3.02 V
Brady Statistic AP VP Percent: 1 %
Brady Statistic AP VS Percent: 1 %
Brady Statistic AS VP Percent: 1 %
Brady Statistic AS VS Percent: 99 %
Brady Statistic RA Percent Paced: 1 %
Brady Statistic RV Percent Paced: 1 %
Date Time Interrogation Session: 20220811020025
Implantable Lead Implant Date: 20211111
Implantable Lead Implant Date: 20211111
Implantable Lead Location: 753859
Implantable Lead Location: 753860
Implantable Pulse Generator Implant Date: 20211111
Lead Channel Impedance Value: 450 Ohm
Lead Channel Impedance Value: 480 Ohm
Lead Channel Pacing Threshold Amplitude: 0.5 V
Lead Channel Pacing Threshold Amplitude: 1 V
Lead Channel Pacing Threshold Pulse Width: 0.4 ms
Lead Channel Pacing Threshold Pulse Width: 0.4 ms
Lead Channel Sensing Intrinsic Amplitude: 0.8 mV
Lead Channel Sensing Intrinsic Amplitude: 9.5 mV
Lead Channel Setting Pacing Amplitude: 2 V
Lead Channel Setting Pacing Amplitude: 2.5 V
Lead Channel Setting Pacing Pulse Width: 0.4 ms
Lead Channel Setting Sensing Sensitivity: 2 mV
Pulse Gen Model: 2272
Pulse Gen Serial Number: 3859811

## 2021-01-22 NOTE — Progress Notes (Signed)
Remote pacemaker transmission.   

## 2021-02-03 DIAGNOSIS — Z23 Encounter for immunization: Secondary | ICD-10-CM | POA: Diagnosis not present

## 2021-02-17 ENCOUNTER — Encounter: Payer: Self-pay | Admitting: Internal Medicine

## 2021-02-23 ENCOUNTER — Other Ambulatory Visit: Payer: Self-pay | Admitting: Internal Medicine

## 2021-03-11 DIAGNOSIS — Z20822 Contact with and (suspected) exposure to covid-19: Secondary | ICD-10-CM | POA: Diagnosis not present

## 2021-03-17 DIAGNOSIS — D225 Melanocytic nevi of trunk: Secondary | ICD-10-CM | POA: Diagnosis not present

## 2021-03-17 DIAGNOSIS — L570 Actinic keratosis: Secondary | ICD-10-CM | POA: Diagnosis not present

## 2021-03-17 DIAGNOSIS — L723 Sebaceous cyst: Secondary | ICD-10-CM | POA: Diagnosis not present

## 2021-03-17 DIAGNOSIS — Z85828 Personal history of other malignant neoplasm of skin: Secondary | ICD-10-CM | POA: Diagnosis not present

## 2021-03-17 DIAGNOSIS — L821 Other seborrheic keratosis: Secondary | ICD-10-CM | POA: Diagnosis not present

## 2021-03-18 ENCOUNTER — Other Ambulatory Visit: Payer: Self-pay | Admitting: Internal Medicine

## 2021-03-19 DIAGNOSIS — E1142 Type 2 diabetes mellitus with diabetic polyneuropathy: Secondary | ICD-10-CM | POA: Diagnosis not present

## 2021-03-19 DIAGNOSIS — I739 Peripheral vascular disease, unspecified: Secondary | ICD-10-CM | POA: Diagnosis not present

## 2021-03-19 DIAGNOSIS — B351 Tinea unguium: Secondary | ICD-10-CM | POA: Diagnosis not present

## 2021-03-19 DIAGNOSIS — L603 Nail dystrophy: Secondary | ICD-10-CM | POA: Diagnosis not present

## 2021-03-19 DIAGNOSIS — L84 Corns and callosities: Secondary | ICD-10-CM | POA: Diagnosis not present

## 2021-03-19 LAB — HM DIABETES FOOT EXAM

## 2021-04-02 ENCOUNTER — Ambulatory Visit (INDEPENDENT_AMBULATORY_CARE_PROVIDER_SITE_OTHER): Payer: Medicare Other

## 2021-04-02 DIAGNOSIS — Z20822 Contact with and (suspected) exposure to covid-19: Secondary | ICD-10-CM | POA: Diagnosis not present

## 2021-04-02 DIAGNOSIS — I442 Atrioventricular block, complete: Secondary | ICD-10-CM

## 2021-04-02 LAB — CUP PACEART REMOTE DEVICE CHECK
Battery Remaining Longevity: 122 mo
Battery Remaining Percentage: 94 %
Battery Voltage: 3.02 V
Brady Statistic AP VP Percent: 1 %
Brady Statistic AP VS Percent: 1 %
Brady Statistic AS VP Percent: 1 %
Brady Statistic AS VS Percent: 99 %
Brady Statistic RA Percent Paced: 1 %
Brady Statistic RV Percent Paced: 1 %
Date Time Interrogation Session: 20221110022840
Implantable Lead Implant Date: 20211111
Implantable Lead Implant Date: 20211111
Implantable Lead Location: 753859
Implantable Lead Location: 753860
Implantable Pulse Generator Implant Date: 20211111
Lead Channel Impedance Value: 450 Ohm
Lead Channel Impedance Value: 480 Ohm
Lead Channel Pacing Threshold Amplitude: 0.5 V
Lead Channel Pacing Threshold Amplitude: 1 V
Lead Channel Pacing Threshold Pulse Width: 0.4 ms
Lead Channel Pacing Threshold Pulse Width: 0.4 ms
Lead Channel Sensing Intrinsic Amplitude: 1.8 mV
Lead Channel Sensing Intrinsic Amplitude: 11.3 mV
Lead Channel Setting Pacing Amplitude: 2 V
Lead Channel Setting Pacing Amplitude: 2.5 V
Lead Channel Setting Pacing Pulse Width: 0.4 ms
Lead Channel Setting Sensing Sensitivity: 2 mV
Pulse Gen Model: 2272
Pulse Gen Serial Number: 3859811

## 2021-04-10 NOTE — Progress Notes (Signed)
Remote pacemaker transmission.   

## 2021-05-05 ENCOUNTER — Encounter: Payer: Self-pay | Admitting: Internal Medicine

## 2021-05-12 DIAGNOSIS — Z20822 Contact with and (suspected) exposure to covid-19: Secondary | ICD-10-CM | POA: Diagnosis not present

## 2021-05-14 ENCOUNTER — Other Ambulatory Visit: Payer: Self-pay | Admitting: Internal Medicine

## 2021-05-16 ENCOUNTER — Other Ambulatory Visit: Payer: Self-pay

## 2021-05-16 ENCOUNTER — Ambulatory Visit
Admission: EM | Admit: 2021-05-16 | Discharge: 2021-05-16 | Discharge status: Absent without leave | Payer: Medicare Other

## 2021-05-17 ENCOUNTER — Other Ambulatory Visit: Payer: Self-pay | Admitting: Internal Medicine

## 2021-05-22 ENCOUNTER — Other Ambulatory Visit: Payer: Self-pay | Admitting: Internal Medicine

## 2021-05-26 ENCOUNTER — Other Ambulatory Visit: Payer: Self-pay | Admitting: Internal Medicine

## 2021-05-26 DIAGNOSIS — L84 Corns and callosities: Secondary | ICD-10-CM | POA: Diagnosis not present

## 2021-05-26 DIAGNOSIS — B351 Tinea unguium: Secondary | ICD-10-CM | POA: Diagnosis not present

## 2021-05-26 DIAGNOSIS — L603 Nail dystrophy: Secondary | ICD-10-CM | POA: Diagnosis not present

## 2021-05-26 DIAGNOSIS — I739 Peripheral vascular disease, unspecified: Secondary | ICD-10-CM | POA: Diagnosis not present

## 2021-05-26 DIAGNOSIS — E1142 Type 2 diabetes mellitus with diabetic polyneuropathy: Secondary | ICD-10-CM | POA: Diagnosis not present

## 2021-06-04 ENCOUNTER — Ambulatory Visit (INDEPENDENT_AMBULATORY_CARE_PROVIDER_SITE_OTHER): Payer: Medicare Other | Admitting: Internal Medicine

## 2021-06-04 ENCOUNTER — Encounter: Payer: Self-pay | Admitting: Internal Medicine

## 2021-06-04 VITALS — BP 136/68 | HR 80 | Temp 97.8°F | Resp 16 | Ht 75.0 in | Wt 202.5 lb

## 2021-06-04 DIAGNOSIS — E7800 Pure hypercholesterolemia, unspecified: Secondary | ICD-10-CM

## 2021-06-04 DIAGNOSIS — I10 Essential (primary) hypertension: Secondary | ICD-10-CM

## 2021-06-04 DIAGNOSIS — D649 Anemia, unspecified: Secondary | ICD-10-CM | POA: Diagnosis not present

## 2021-06-04 DIAGNOSIS — E1140 Type 2 diabetes mellitus with diabetic neuropathy, unspecified: Secondary | ICD-10-CM | POA: Diagnosis not present

## 2021-06-04 DIAGNOSIS — N400 Enlarged prostate without lower urinary tract symptoms: Secondary | ICD-10-CM

## 2021-06-04 DIAGNOSIS — Z1211 Encounter for screening for malignant neoplasm of colon: Secondary | ICD-10-CM | POA: Diagnosis not present

## 2021-06-04 DIAGNOSIS — Z125 Encounter for screening for malignant neoplasm of prostate: Secondary | ICD-10-CM

## 2021-06-04 LAB — IBC + FERRITIN
Ferritin: 95.2 ng/mL (ref 22.0–322.0)
Iron: 85 ug/dL (ref 42–165)
Saturation Ratios: 25.3 % (ref 20.0–50.0)
TIBC: 336 ug/dL (ref 250.0–450.0)
Transferrin: 240 mg/dL (ref 212.0–360.0)

## 2021-06-04 LAB — MICROALBUMIN / CREATININE URINE RATIO
Creatinine,U: 65.8 mg/dL
Microalb Creat Ratio: 1.2 mg/g (ref 0.0–30.0)
Microalb, Ur: 0.8 mg/dL (ref 0.0–1.9)

## 2021-06-04 LAB — CBC WITH DIFFERENTIAL/PLATELET
Basophils Absolute: 0 10*3/uL (ref 0.0–0.1)
Basophils Relative: 0.6 % (ref 0.0–3.0)
Eosinophils Absolute: 0.2 10*3/uL (ref 0.0–0.7)
Eosinophils Relative: 4.2 % (ref 0.0–5.0)
HCT: 35 % — ABNORMAL LOW (ref 39.0–52.0)
Hemoglobin: 11.5 g/dL — ABNORMAL LOW (ref 13.0–17.0)
Lymphocytes Relative: 18.9 % (ref 12.0–46.0)
Lymphs Abs: 1 10*3/uL (ref 0.7–4.0)
MCHC: 32.9 g/dL (ref 30.0–36.0)
MCV: 93.9 fl (ref 78.0–100.0)
Monocytes Absolute: 0.4 10*3/uL (ref 0.1–1.0)
Monocytes Relative: 7.9 % (ref 3.0–12.0)
Neutro Abs: 3.5 10*3/uL (ref 1.4–7.7)
Neutrophils Relative %: 68.4 % (ref 43.0–77.0)
Platelets: 300 10*3/uL (ref 150.0–400.0)
RBC: 3.73 Mil/uL — ABNORMAL LOW (ref 4.22–5.81)
RDW: 13.7 % (ref 11.5–15.5)
WBC: 5.2 10*3/uL (ref 4.0–10.5)

## 2021-06-04 LAB — BASIC METABOLIC PANEL
BUN: 21 mg/dL (ref 6–23)
CO2: 28 mEq/L (ref 19–32)
Calcium: 9.7 mg/dL (ref 8.4–10.5)
Chloride: 100 mEq/L (ref 96–112)
Creatinine, Ser: 0.93 mg/dL (ref 0.40–1.50)
GFR: 76.39 mL/min (ref >60.00)
Glucose, Bld: 131 mg/dL — ABNORMAL HIGH (ref 70–99)
Potassium: 4.6 mEq/L (ref 3.5–5.1)
Sodium: 136 mEq/L (ref 135–145)

## 2021-06-04 LAB — PSA: PSA: 1.61 ng/mL (ref 0.10–4.00)

## 2021-06-04 LAB — TSH: TSH: 1.17 u[IU]/mL (ref 0.35–5.50)

## 2021-06-04 LAB — HEMOGLOBIN A1C: Hgb A1c MFr Bld: 6.4 % (ref 4.6–6.5)

## 2021-06-04 NOTE — Progress Notes (Signed)
Subjective:    Patient ID: Chase Martinez, male    DOB: 1940/12/09, 81 y.o.   MRN: [id number]  DOS:  06/04/2021 Type of visit - description: Follow-up  Since the last office visit is doing well.  Today he reports he likes to continue prostate cancer and colon cancer screening.  We reviewed his ambulatory CBGs - BP readings.  Review of Systems  Denies fever chills No chest pain or difficulty breathing.  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   Premature atrial contractions     Past Surgical History:  Procedure Laterality Date   CATARACT EXTRACTION Bilateral 2015   PACEMAKER IMPLANT N/A 04/03/2020   Procedure: PACEMAKER IMPLANT;  Surgeon: Vickie Epley, MD;  Location: [facility];  Service: Cardiovascular;  Laterality: N/A;   PILONIDAL CYST EXCISION     REFRACTIVE SURGERY     SHOULDER SURGERY     right   TONSILLECTOMY      Current Outpatient Medications  Medication Instructions   atorvastatin (LIPITOR) 20 MG tablet TAKE 1 TABLET DAILY   Fish Oil 1,000 mg, Oral, Daily,     furosemide (LASIX) 20 MG tablet TAKE 1 TABLET DAILY   glucose blood (FREESTYLE LITE) test strip USE TO CHECK BLOOD SUGAR NOT MORE THAN TWICE A DAY   JANUVIA 100 MG tablet TAKE 1 TABLET DAILY   Lancets (FREESTYLE) lancets USE AS DIRECTED TO CHECK BLOOD SUGARS 3 TO 4 TIMES DAILY   metFORMIN (GLUCOPHAGE) 1000 MG tablet TAKE 1 TABLET TWICE A DAY WITH MEALS   multivitamin (THERAGRAN) per tablet 1 tablet, Oral, Daily,     pioglitazone (ACTOS) 30 MG tablet TAKE 1 TABLET DAILY   tamsulosin (FLOMAX) 0.4 MG CAPS capsule TAKE 1 CAPSULE DAILY AFTER SUPPER   vitamin C 1,000 mg, Oral,  Daily   Vitamin D3 2,000 Units, Oral, Daily       Objective:   Physical Exam BP 136/68 (BP Location: Left Arm, Patient Position: Sitting, Cuff Size: Small)    Pulse 80    Temp 97.8 F (36.6 C) (Oral)    Resp 16    Ht 6\' 3"  (1.905 m)    Wt 202 lb 8 oz (91.9 kg)    SpO2 98%    BMI 25.31 kg/m  General:   Well developed, NAD, BMI noted. HEENT:  Normocephalic . Face symmetric, atraumatic Lungs:  CTA B Normal respiratory effort, no intercostal retractions, no accessory muscle use. Heart: RRR,  no murmur.  Lower extremities: no pretibial edema bilaterally DRE: Normal sphincter tone, brown stools, prostate is enlarged but not tender or nodular. Skin: Not pale. Not jaundice Neurologic:  alert & oriented X3.  Speech normal, gait appropriate for age and unassisted Psych--  Cognition and judgment appear intact.  Cooperative with normal attention span and concentration.  Behavior appropriate. No anxious or depressed appearing.      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 Ronnald Ramp Nail dystrophy  Asbestosis dx 2019 Shingles 04-2020  PLAN: DM: On  Januvia, metformin, pioglitazone.  Has a healthy lifestyle, ambulatory CBGs in the low 100s ; check TSH, A1c and micro  HTN: Ambulatory BPs never more than the 130s/70s.  Continue Lasix, check BMP. Mild anemia: Chronic, rechecking a CBC and iron panel. Asbestosis: Saw pulmonary 12/16/2020, PFTs at the time >>  improvement in lung capacity.  He was noted to be asx, next visit 1 year. Colon cancer screening: Desires to cont screening, he feels he is in good health and mother lived > 100 years.   Refer to GI. Prostate cancer screening: See above, no symptoms, DRE negative, check PSA Skin lesion: Has a skin lesion on the scalp, to see dermatology next month. RTC 6 months  This visit occurred during the SARS-CoV-2  public health emergency.  Safety protocols were in place, including screening questions prior to the visit, additional usage of staff PPE, and extensive cleaning of exam room while observing appropriate contact time as indicated for disinfecting solutions.

## 2021-06-04 NOTE — Patient Instructions (Signed)
Continue checking your blood sugars and blood pressures. Next  GO TO THE LAB : Get the blood work     [place], [place] back for a checkup in 6 months

## 2021-06-04 NOTE — Assessment & Plan Note (Signed)
DM: On Januvia, metformin, pioglitazone.  Has a healthy lifestyle, ambulatory CBGs in the low 100s ; check TSH, A1c and micro  HTN: Ambulatory BPs never more than the 130s/70s.  Continue Lasix, check BMP. Mild anemia: Chronic, rechecking a CBC and iron panel. Asbestosis: Saw pulmonary 12/16/2020, PFTs at the time >>  improvement in lung capacity.  He was noted to be asx, next visit 1 year. Colon cancer screening: Desires to cont screening, he feels he is in good health and mother lived > 100 years.   Refer to GI. Prostate cancer screening: See above, no symptoms, DRE negative, check PSA Skin lesion: Has a skin lesion on the scalp, to see dermatology next month. RTC 6 months

## 2021-06-09 DIAGNOSIS — H401231 Low-tension glaucoma, bilateral, mild stage: Secondary | ICD-10-CM | POA: Diagnosis not present

## 2021-06-18 DIAGNOSIS — Z20822 Contact with and (suspected) exposure to covid-19: Secondary | ICD-10-CM | POA: Diagnosis not present

## 2021-06-22 ENCOUNTER — Other Ambulatory Visit: Payer: Self-pay | Admitting: Internal Medicine

## 2021-06-22 DIAGNOSIS — M25551 Pain in right hip: Secondary | ICD-10-CM | POA: Diagnosis not present

## 2021-06-25 DIAGNOSIS — M7631 Iliotibial band syndrome, right leg: Secondary | ICD-10-CM | POA: Diagnosis not present

## 2021-06-26 DIAGNOSIS — Z20828 Contact with and (suspected) exposure to other viral communicable diseases: Secondary | ICD-10-CM | POA: Diagnosis not present

## 2021-06-29 DIAGNOSIS — Z20822 Contact with and (suspected) exposure to covid-19: Secondary | ICD-10-CM | POA: Diagnosis not present

## 2021-07-02 ENCOUNTER — Ambulatory Visit (INDEPENDENT_AMBULATORY_CARE_PROVIDER_SITE_OTHER): Payer: Medicare Other

## 2021-07-02 DIAGNOSIS — I442 Atrioventricular block, complete: Secondary | ICD-10-CM | POA: Diagnosis not present

## 2021-07-02 LAB — CUP PACEART REMOTE DEVICE CHECK
Battery Remaining Longevity: 119 mo
Battery Remaining Percentage: 92 %
Battery Voltage: 3.02 V
Brady Statistic AP VP Percent: 1 %
Brady Statistic AP VS Percent: 1 %
Brady Statistic AS VP Percent: 1 %
Brady Statistic AS VS Percent: 99 %
Brady Statistic RA Percent Paced: 1 %
Brady Statistic RV Percent Paced: 1 %
Date Time Interrogation Session: 20230209020014
Implantable Lead Implant Date: 20211111
Implantable Lead Implant Date: 20211111
Implantable Lead Location: 753859
Implantable Lead Location: 753860
Implantable Pulse Generator Implant Date: 20211111
Lead Channel Impedance Value: 450 Ohm
Lead Channel Impedance Value: 460 Ohm
Lead Channel Pacing Threshold Amplitude: 0.5 V
Lead Channel Pacing Threshold Amplitude: 1 V
Lead Channel Pacing Threshold Pulse Width: 0.4 ms
Lead Channel Pacing Threshold Pulse Width: 0.4 ms
Lead Channel Sensing Intrinsic Amplitude: 1.2 mV
Lead Channel Sensing Intrinsic Amplitude: 10.5 mV
Lead Channel Setting Pacing Amplitude: 2 V
Lead Channel Setting Pacing Amplitude: 2.5 V
Lead Channel Setting Pacing Pulse Width: 0.4 ms
Lead Channel Setting Sensing Sensitivity: 2 mV
Pulse Gen Model: 2272
Pulse Gen Serial Number: 3859811

## 2021-07-07 DIAGNOSIS — Z85828 Personal history of other malignant neoplasm of skin: Secondary | ICD-10-CM | POA: Diagnosis not present

## 2021-07-07 DIAGNOSIS — D485 Neoplasm of uncertain behavior of skin: Secondary | ICD-10-CM | POA: Diagnosis not present

## 2021-07-07 DIAGNOSIS — D044 Carcinoma in situ of skin of scalp and neck: Secondary | ICD-10-CM | POA: Diagnosis not present

## 2021-07-07 NOTE — Progress Notes (Signed)
Remote pacemaker transmission.   

## 2021-07-27 DIAGNOSIS — M25551 Pain in right hip: Secondary | ICD-10-CM | POA: Diagnosis not present

## 2021-07-30 DIAGNOSIS — Z1152 Encounter for screening for COVID-19: Secondary | ICD-10-CM | POA: Diagnosis not present

## 2021-07-30 DIAGNOSIS — Z20828 Contact with and (suspected) exposure to other viral communicable diseases: Secondary | ICD-10-CM | POA: Diagnosis not present

## 2021-08-04 DIAGNOSIS — E1142 Type 2 diabetes mellitus with diabetic polyneuropathy: Secondary | ICD-10-CM | POA: Diagnosis not present

## 2021-08-04 DIAGNOSIS — I739 Peripheral vascular disease, unspecified: Secondary | ICD-10-CM | POA: Diagnosis not present

## 2021-08-04 DIAGNOSIS — B351 Tinea unguium: Secondary | ICD-10-CM | POA: Diagnosis not present

## 2021-08-04 DIAGNOSIS — L603 Nail dystrophy: Secondary | ICD-10-CM | POA: Diagnosis not present

## 2021-08-04 DIAGNOSIS — L84 Corns and callosities: Secondary | ICD-10-CM | POA: Diagnosis not present

## 2021-08-10 DIAGNOSIS — Z20822 Contact with and (suspected) exposure to covid-19: Secondary | ICD-10-CM | POA: Diagnosis not present

## 2021-08-20 ENCOUNTER — Encounter: Payer: Self-pay | Admitting: Internal Medicine

## 2021-08-20 DIAGNOSIS — Z20822 Contact with and (suspected) exposure to covid-19: Secondary | ICD-10-CM | POA: Diagnosis not present

## 2021-08-21 DIAGNOSIS — Z20822 Contact with and (suspected) exposure to covid-19: Secondary | ICD-10-CM | POA: Diagnosis not present

## 2021-09-04 DIAGNOSIS — Z20822 Contact with and (suspected) exposure to covid-19: Secondary | ICD-10-CM | POA: Diagnosis not present

## 2021-09-07 DIAGNOSIS — M545 Low back pain, unspecified: Secondary | ICD-10-CM | POA: Diagnosis not present

## 2021-09-07 DIAGNOSIS — Z20822 Contact with and (suspected) exposure to covid-19: Secondary | ICD-10-CM | POA: Diagnosis not present

## 2021-09-08 DIAGNOSIS — Z20822 Contact with and (suspected) exposure to covid-19: Secondary | ICD-10-CM | POA: Diagnosis not present

## 2021-09-12 DIAGNOSIS — Z20828 Contact with and (suspected) exposure to other viral communicable diseases: Secondary | ICD-10-CM | POA: Diagnosis not present

## 2021-09-19 DIAGNOSIS — Z20822 Contact with and (suspected) exposure to covid-19: Secondary | ICD-10-CM | POA: Diagnosis not present

## 2021-09-21 DIAGNOSIS — Z20822 Contact with and (suspected) exposure to covid-19: Secondary | ICD-10-CM | POA: Diagnosis not present

## 2021-09-23 DIAGNOSIS — Z20822 Contact with and (suspected) exposure to covid-19: Secondary | ICD-10-CM | POA: Diagnosis not present

## 2021-09-28 DIAGNOSIS — Z20822 Contact with and (suspected) exposure to covid-19: Secondary | ICD-10-CM | POA: Diagnosis not present

## 2021-09-29 DIAGNOSIS — Z20822 Contact with and (suspected) exposure to covid-19: Secondary | ICD-10-CM | POA: Diagnosis not present

## 2021-10-01 ENCOUNTER — Ambulatory Visit (INDEPENDENT_AMBULATORY_CARE_PROVIDER_SITE_OTHER): Payer: Medicare Other

## 2021-10-01 DIAGNOSIS — I442 Atrioventricular block, complete: Secondary | ICD-10-CM

## 2021-10-01 LAB — CUP PACEART REMOTE DEVICE CHECK
Battery Remaining Longevity: 116 mo
Battery Remaining Percentage: 90 %
Battery Voltage: 3.02 V
Brady Statistic AP VP Percent: 1 %
Brady Statistic AP VS Percent: 1 %
Brady Statistic AS VP Percent: 1 %
Brady Statistic AS VS Percent: 99 %
Brady Statistic RA Percent Paced: 1 %
Brady Statistic RV Percent Paced: 1 %
Date Time Interrogation Session: 20230511020014
Implantable Lead Implant Date: 20211111
Implantable Lead Implant Date: 20211111
Implantable Lead Location: 753859
Implantable Lead Location: 753860
Implantable Pulse Generator Implant Date: 20211111
Lead Channel Impedance Value: 440 Ohm
Lead Channel Impedance Value: 450 Ohm
Lead Channel Pacing Threshold Amplitude: 0.5 V
Lead Channel Pacing Threshold Amplitude: 1 V
Lead Channel Pacing Threshold Pulse Width: 0.4 ms
Lead Channel Pacing Threshold Pulse Width: 0.4 ms
Lead Channel Sensing Intrinsic Amplitude: 1 mV
Lead Channel Sensing Intrinsic Amplitude: 8.6 mV
Lead Channel Setting Pacing Amplitude: 2 V
Lead Channel Setting Pacing Amplitude: 2.5 V
Lead Channel Setting Pacing Pulse Width: 0.4 ms
Lead Channel Setting Sensing Sensitivity: 2 mV
Pulse Gen Model: 2272
Pulse Gen Serial Number: 3859811

## 2021-10-06 ENCOUNTER — Encounter: Payer: Self-pay | Admitting: Internal Medicine

## 2021-10-06 DIAGNOSIS — H26493 Other secondary cataract, bilateral: Secondary | ICD-10-CM | POA: Diagnosis not present

## 2021-10-06 DIAGNOSIS — H52203 Unspecified astigmatism, bilateral: Secondary | ICD-10-CM | POA: Diagnosis not present

## 2021-10-06 DIAGNOSIS — H401231 Low-tension glaucoma, bilateral, mild stage: Secondary | ICD-10-CM | POA: Diagnosis not present

## 2021-10-06 DIAGNOSIS — E119 Type 2 diabetes mellitus without complications: Secondary | ICD-10-CM | POA: Diagnosis not present

## 2021-10-06 LAB — HM DIABETES EYE EXAM

## 2021-10-08 NOTE — Progress Notes (Signed)
Remote pacemaker transmission.   

## 2021-10-14 DIAGNOSIS — Z20828 Contact with and (suspected) exposure to other viral communicable diseases: Secondary | ICD-10-CM | POA: Diagnosis not present

## 2021-10-20 DIAGNOSIS — B351 Tinea unguium: Secondary | ICD-10-CM | POA: Diagnosis not present

## 2021-10-20 DIAGNOSIS — L84 Corns and callosities: Secondary | ICD-10-CM | POA: Diagnosis not present

## 2021-10-20 DIAGNOSIS — L603 Nail dystrophy: Secondary | ICD-10-CM | POA: Diagnosis not present

## 2021-10-20 DIAGNOSIS — Z20828 Contact with and (suspected) exposure to other viral communicable diseases: Secondary | ICD-10-CM | POA: Diagnosis not present

## 2021-10-20 DIAGNOSIS — I739 Peripheral vascular disease, unspecified: Secondary | ICD-10-CM | POA: Diagnosis not present

## 2021-10-20 DIAGNOSIS — E1142 Type 2 diabetes mellitus with diabetic polyneuropathy: Secondary | ICD-10-CM | POA: Diagnosis not present

## 2021-11-03 ENCOUNTER — Other Ambulatory Visit: Payer: Self-pay | Admitting: Internal Medicine

## 2021-11-09 NOTE — Progress Notes (Unsigned)
Subjective:   Chase Martinez is a 81 y.o. male who presents for Medicare Annual/Subsequent preventive examination.  Review of Systems           Objective:    There were no vitals filed for this visit. There is no height or weight on file to calculate BMI.     11/04/2020   10:59 AM 04/03/2020    8:46 AM 03/13/2019    3:19 PM 03/09/2018    9:05 AM 03/02/2017    4:09 PM 08/03/2016    3:28 PM 11/28/2015   12:17 PM  Advanced Directives  Does Patient Have a Medical Advance Directive? Yes Yes Yes Yes Yes Yes Yes  Type of Paramedic of [place];Living will [place];Living will [place];Living will [place];Living will [place];Living will [place];Living will [place];Living will  Does patient want to make changes to medical advance directive?   No - Patient declined      Copy of [place] in Chart? No - copy requested  Yes - validated most recent copy scanned in chart (See row information) No - copy requested No - copy requested No - copy requested     Current Medications (verified) Outpatient Encounter Medications as of 11/10/2021  Medication Sig   Ascorbic Acid (VITAMIN C) 1000 MG tablet Take 1,000 mg by mouth daily.    atorvastatin (LIPITOR) 20 MG tablet TAKE 1 TABLET DAILY   Cholecalciferol (VITAMIN D3) 50 MCG (2000 UT) capsule Take 2,000 Units by mouth daily.    furosemide (LASIX) 20 MG tablet TAKE 1 TABLET DAILY   glucose blood (FREESTYLE LITE) test strip USE TO CHECK BLOOD SUGAR NOT MORE THAN TWICE A DAY   JANUVIA 100 MG tablet TAKE 1 TABLET DAILY   Lancets (FREESTYLE) lancets USE AS DIRECTED TO CHECK BLOOD SUGARS 3 TO 4 TIMES DAILY   metFORMIN (GLUCOPHAGE) 1000 MG tablet TAKE 1 TABLET TWICE A DAY WITH MEALS   multivitamin (THERAGRAN) per tablet Take 1 tablet by mouth daily.   Omega-3 Fatty Acids (FISH OIL) 1000 MG  CAPS Take 1,000 mg by mouth daily.   pioglitazone (ACTOS) 30 MG tablet TAKE 1 TABLET DAILY   tamsulosin (FLOMAX) 0.4 MG CAPS capsule TAKE 1 CAPSULE DAILY AFTER SUPPER   No facility-administered encounter medications on file as of 11/10/2021.    Allergies (verified) Patient has no known allergies.   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   Procedure: PACEMAKER IMPLANT;  Surgeon: Vickie Epley, MD;  Location: [facility];  Service: Cardiovascular;  Laterality: N/A;   PILONIDAL CYST EXCISION     REFRACTIVE SURGERY     SHOULDER SURGERY     right   TONSILLECTOMY     Family History  Problem Relation Age of Onset   Hypertension Mother    Glaucoma Mother    Dementia Father    Parkinsonism Sister    Thyroid cancer Son    Coronary artery disease Neg Hx    Diabetes Neg  Hx    Stroke Neg Hx    Prostate cancer Neg Hx    Colon cancer Neg Hx    Esophageal cancer Neg Hx    Stomach cancer Neg Hx    Social History   Socioeconomic History   Marital status: Married    Spouse name: Not on file   Number of children: 2   Years of education: Not on file   Highest education level: Not on file  Occupational History   Occupation: Retired Therapist, art Engineering geologist)  Tobacco Use   Smoking status: Former    Packs/day: 1.00    Types: Cigarettes, Cigars    Quit date: 05/24/1966    Years since quitting: 55.5   Smokeless tobacco: Former    Types: Snuff    Quit date: 05/24/1988  Vaping Use   Vaping Use: Never used  Substance and Sexual Activity   Alcohol use: Yes    Alcohol/week: 7.0 - 14.0 standard drinks of alcohol    Types: 7 -  14 Glasses of wine per week    Comment: occasional beer and wine   Drug use: No   Sexual activity: Yes  Other Topics Concern   Not on file  Social History Narrative   Household-- pt and wife    Son lives near by, daughter near [place] Strain: [place]  (11/04/2020)   Overall Financial Resource Strain (CARDIA)    Difficulty of Paying Living Expenses: Not hard at all  Food Insecurity: No Food Insecurity (11/04/2020)   Hunger Vital Sign    Worried About Running Out of Food in the Last Year: Never true    Ran Out of Food in the Last Year: Never true  Transportation Needs: No Transportation Needs (11/04/2020)   PRAPARE - Hydrologist (Medical): No    Lack of Transportation (Non-Medical): No  Physical Activity: Sufficiently Active (11/04/2020)   Exercise Vital Sign    Days of Exercise per Week: 4 days    Minutes of Exercise per Session: 60 min  Stress: No Stress Concern Present (11/04/2020)   [place]    Feeling of Stress : Not at all  Social Connections: [place] (11/04/2020)   Social Connection and Isolation Panel [NHANES]    Frequency of Communication with Friends and Family: More than three times a week    Frequency of Social Gatherings with Friends and Family: More than three times a week    Attends Religious Services: More than 4 times per year    Active Member of [employer] or Organizations: Yes    Attends Music therapist: More than 4 times per year    Marital Status: Married    Tobacco Counseling Counseling given: Not Answered   Clinical Intake:                 Diabetic?yes Nutrition Risk Assessment:  Has the patient had any N/V/D within the last 2 months?  {YES/NO:21197} Does the patient have any non-healing wounds?  {YES/NO:21197} Has the patient had any unintentional weight loss or  weight gain?  {YES/NO:21197}  Diabetes:  Is the patient diabetic?  {YES/NO:21197} If diabetic, was a CBG obtained today?  {YES/NO:21197} Did the patient bring in their glucometer from home?  {YES/NO:21197} How often do you monitor your CBG's? ***.   Financial Strains and Diabetes Management:  Are you having any financial strains with the  device, your supplies or your medication? {YES/NO:21197}.  Does the patient want to be seen by Chronic Care Management for management of their diabetes?  {YES/NO:21197} Would the patient like to be referred to a Nutritionist or for Diabetic Management?  {YES/NO:21197}  Diabetic Exams:  Diabetic Eye Exam: Completed 03/19/21 Diabetic Foot Exam: Completed 10/06/21           Activities of Daily Living     No data to display          Patient Care Team: Colon Branch, MD as PCP - General Sueanne Margarita, MD as PCP - Cardiology (Cardiology) Vickie Epley, MD as PCP - Electrophysiology (Cardiology) Rosemary Holms, DPM as Consulting Physician (Podiatry) Marygrace Drought, MD as Consulting Physician (Ophthalmology)  Indicate any recent Medical Services you may have received from other than [facility] providers in the past year (date may be approximate).     Assessment:   This is a routine wellness examination for [place].  Hearing/Vision screen No results found.  Dietary issues and exercise activities discussed:     Goals Addressed   None    Depression Screen    06/04/2021    8:21 AM 12/04/2020    8:18 AM 11/04/2020   11:03 AM 04/29/2020    8:48 AM 03/13/2019    3:21 PM 03/13/2019    8:38 AM 03/09/2018    9:05 AM  PHQ 2/9 Scores  PHQ - 2 Score 0 0 0 0 0 0 0    Fall Risk    06/04/2021    8:20 AM 12/04/2020    8:18 AM 11/04/2020   11:02 AM 04/29/2020    8:48 AM 03/13/2019    3:21 PM  Fall Risk   Falls in the past year? 0 0 0 0 0  Number falls in past yr: 0 0 0 0 0  Injury with Fall? 0 0 0 0 0  Follow up Falls evaluation  completed Falls evaluation completed Falls prevention discussed      FALL RISK PREVENTION PERTAINING TO THE HOME:  Any stairs in or around the home? {YES/NO:21197} If so, are there any without handrails? {YES/NO:21197} Home free of loose throw rugs in walkways, pet beds, electrical cords, etc? {YES/NO:21197} Adequate lighting in your home to reduce risk of falls? {YES/NO:21197}  ASSISTIVE DEVICES UTILIZED TO PREVENT FALLS:  Life alert? {YES/NO:21197} Use of a cane, walker or w/c? {YES/NO:21197} Grab bars in the bathroom? {YES/NO:21197} Shower chair or bench in shower? {YES/NO:21197} Elevated toilet seat or a handicapped toilet? {YES/NO:21197}  TIMED UP AND GO:  Was the test performed? {YES/NO:21197}.  Length of time to ambulate 10 feet: *** sec.   {Appearance of QVZD:[id number]}  Cognitive Function:        Immunizations Immunization History  Administered Date(s) Administered   DTaP 06/21/2011   Fluad Quad(high Dose 65+) 02/28/2020   H1N1 06/11/2008   Hepatitis A 12/23/2015   Influenza Split 02/22/2012   Influenza Whole 04/05/2007, 02/13/2008, 02/18/2009, 01/20/2010   Influenza, High Dose Seasonal PF 04/04/2013, 03/02/2017, 03/09/2018, 01/06/2019   Influenza,inj,Quad PF,6+ Mos 03/07/2014   Influenza-Unspecified 03/06/2015, 02/20/2016, 02/03/2021   PFIZER(Purple Top)SARS-COV-2 Vaccination 06/05/2019, 06/25/2019, 02/18/2020, 08/22/2020   Pfizer Covid-19 Vaccine Bivalent Booster 50yr & up 02/03/2021   Pneumococcal Conjugate-13 10/30/2013   Pneumococcal Polysaccharide-23 05/24/2002, 09/28/2007, 08/05/2016   Td 05/24/2001, 05/21/2013   Typhoid Inactivated 12/23/2015   Yellow Fever 12/23/2015   Zoster Recombinat (Shingrix) 03/21/2018, 05/25/2018   Zoster, Live 05/24/2002    TDAP status: Up to date  Flu Vaccine status: Up to date  Pneumococcal vaccine status: Up to date  Covid-19 vaccine status: Completed vaccines  Qualifies for Shingles Vaccine? Yes   Zostavax  completed No   Shingrix Completed?: Yes  Screening Tests Health Maintenance  Topic Date Due   HEMOGLOBIN A1C  12/02/2021   INFLUENZA VACCINE  12/22/2021   FOOT EXAM  03/19/2022   URINE MICROALBUMIN  06/04/2022   OPHTHALMOLOGY EXAM  10/07/2022   TETANUS/TDAP  05/22/2023   Pneumonia Vaccine 50+ Years old  Completed   COVID-19 Vaccine  Completed   Zoster Vaccines- Shingrix  Completed   HPV VACCINES  Aged Out    Health Maintenance  There are no preventive care reminders to display for this patient.  Colorectal cancer screening: No longer required.   Lung Cancer Screening: (Low Dose CT Chest recommended if Age 70-80 years, 30 pack-year currently smoking OR have quit w/in 15years.) does not qualify.   Lung Cancer Screening Referral: N/A  Additional Screening:  Hepatitis C Screening: does not qualify; Completed aged out  Vision Screening: Recommended annual ophthalmology exams for early detection of glaucoma and other disorders of the eye. Is the patient up to date with their annual eye exam?  {YES/NO:21197} Who is the provider or what is the name of the office in which the patient attends annual eye exams? *** If pt is not established with a provider, would they like to be referred to a provider to establish care? {YES/NO:21197}.   Dental Screening: Recommended annual dental exams for proper oral hygiene  Community Resource Referral / Chronic Care Management: CRR required this visit?  {YES/NO:21197}  CCM required this visit?  {YES/NO:21197}     Plan:     I have personally reviewed and noted the following in the patient's chart:   Medical and social history Use of alcohol, tobacco or illicit drugs  Current medications and supplements including opioid prescriptions. {Opioid Prescriptions:[phone]} Functional ability and status Nutritional status Physical activity Advanced directives List of other physicians Hospitalizations, surgeries, and ER visits in previous 12  months Vitals Screenings to include cognitive, depression, and falls Referrals and appointments  In addition, I have reviewed and discussed with patient certain preventive protocols, quality metrics, and best practice recommendations. A written personalized care plan for preventive services as well as general preventive health recommendations were provided to patient.     Duard Brady Leoni Goodness, [place]   11/09/2021   Nurse Notes: ***

## 2021-11-10 ENCOUNTER — Ambulatory Visit (INDEPENDENT_AMBULATORY_CARE_PROVIDER_SITE_OTHER): Payer: Medicare Other

## 2021-11-10 DIAGNOSIS — Z0000 Encounter for general adult medical examination without abnormal findings: Secondary | ICD-10-CM | POA: Diagnosis not present

## 2021-11-10 NOTE — Patient Instructions (Signed)
Chase Martinez , Thank you for taking time to come for your Medicare Wellness Visit. I appreciate your ongoing commitment to your health goals. Please review the following plan we discussed and let me know if I can assist you in the future.   Screening recommendations/referrals: Colonoscopy: no longer needed Recommended yearly ophthalmology/optometry visit for glaucoma screening and checkup Recommended yearly dental visit for hygiene and checkup  Vaccinations: Influenza vaccine: up to date Pneumococcal vaccine: up to date Tdap vaccine: up to date Shingles vaccine: up to date   Covid-19: completed  Advanced directives: yes, not on file  Conditions/risks identified: see problem list  Next appointment: Follow up in one year for your annual wellness visit. 11/16/22  Preventive Care 83 Years and Older, Male Preventive care refers to lifestyle choices and visits with your health care provider that can promote health and wellness. What does preventive care include? A yearly physical exam. This is also called an annual well check. Dental exams once or twice a year. Routine eye exams. Ask your health care provider how often you should have your eyes checked. Personal lifestyle choices, including: Daily care of your teeth and gums. Regular physical activity. Eating a healthy diet. Avoiding tobacco and drug use. Limiting alcohol use. Practicing safe sex. Taking low doses of aspirin every day. Taking vitamin and mineral supplements as recommended by your health care provider. What happens during an annual well check? The services and screenings done by your health care provider during your annual well check will depend on your age, overall health, lifestyle risk factors, and family history of disease. Counseling  Your health care provider may ask you questions about your: Alcohol use. Tobacco use. Drug use. Emotional well-being. Home and relationship well-being. Sexual activity. Eating  habits. History of falls. Memory and ability to understand (cognition). Work and work Statistician. Screening  You may have the following tests or measurements: Height, weight, and BMI. Blood pressure. Lipid and cholesterol levels. These may be checked every 5 years, or more frequently if you are over 83 years old. Skin check. Lung cancer screening. You may have this screening every year starting at age 83 if you have a 30-pack-year history of smoking and currently smoke or have quit within the past 15 years. Fecal occult blood test (FOBT) of the stool. You may have this test every year starting at age 43. Flexible sigmoidoscopy or colonoscopy. You may have a sigmoidoscopy every 5 years or a colonoscopy every 10 years starting at age 39. Prostate cancer screening. Recommendations will vary depending on your family history and other risks. Hepatitis C blood test. Hepatitis B blood test. Sexually transmitted disease (STD) testing. Diabetes screening. This is done by checking your blood sugar (glucose) after you have not eaten for a while (fasting). You may have this done every 1-3 years. Abdominal aortic aneurysm (AAA) screening. You may need this if you are a current or former smoker. Osteoporosis. You may be screened starting at age 83 if you are at high risk. Talk with your health care provider about your test results, treatment options, and if necessary, the need for more tests. Vaccines  Your health care provider may recommend certain vaccines, such as: Influenza vaccine. This is recommended every year. Tetanus, diphtheria, and acellular pertussis (Tdap, Td) vaccine. You may need a Td booster every 10 years. Zoster vaccine. You may need this after age 83. Pneumococcal 13-valent conjugate (PCV13) vaccine. One dose is recommended after age 83. Pneumococcal polysaccharide (PPSV23) vaccine. One dose is recommended  after age 83. Talk to your health care provider about which screenings and  vaccines you need and how often you need them. This information is not intended to replace advice given to you by your health care provider. Make sure you discuss any questions you have with your health care provider. Document Released: 06/06/2015 Document Revised: 01/28/2016 Document Reviewed: 03/11/2015 Elsevier Interactive Patient Education  2017 [place] Prevention in the Home Falls can cause injuries. They can happen to people of all ages. There are many things you can do to make your home safe and to help prevent falls. What can I do on the outside of my home? Regularly fix the edges of walkways and driveways and fix any cracks. Remove anything that might make you trip as you walk through a door, such as a raised step or threshold. Trim any bushes or trees on the path to your home. Use bright outdoor lighting. Clear any walking paths of anything that might make someone trip, such as rocks or tools. Regularly check to see if handrails are loose or broken. Make sure that both sides of any steps have handrails. Any raised decks and porches should have guardrails on the edges. Have any leaves, snow, or ice cleared regularly. Use sand or salt on walking paths during winter. Clean up any spills in your garage right away. This includes oil or grease spills. What can I do in the bathroom? Use night lights. Install grab bars by the toilet and in the tub and shower. Do not use towel bars as grab bars. Use non-skid mats or decals in the tub or shower. If you need to sit down in the shower, use a plastic, non-slip stool. Keep the floor dry. Clean up any water that spills on the floor as soon as it happens. Remove soap buildup in the tub or shower regularly. Attach bath mats securely with double-sided non-slip rug tape. Do not have throw rugs and other things on the floor that can make you trip. What can I do in the bedroom? Use night lights. Make sure that you have a light by your  bed that is easy to reach. Do not use any sheets or blankets that are too big for your bed. They should not hang down onto the floor. Have a firm chair that has side arms. You can use this for support while you get dressed. Do not have throw rugs and other things on the floor that can make you trip. What can I do in the kitchen? Clean up any spills right away. Avoid walking on wet floors. Keep items that you use a lot in easy-to-reach places. If you need to reach something above you, use a strong step stool that has a grab bar. Keep electrical cords out of the way. Do not use floor polish or wax that makes floors slippery. If you must use wax, use non-skid floor wax. Do not have throw rugs and other things on the floor that can make you trip. What can I do with my stairs? Do not leave any items on the stairs. Make sure that there are handrails on both sides of the stairs and use them. Fix handrails that are broken or loose. Make sure that handrails are as long as the stairways. Check any carpeting to make sure that it is firmly attached to the stairs. Fix any carpet that is loose or worn. Avoid having throw rugs at the top or bottom of the stairs. If you  do have throw rugs, attach them to the floor with carpet tape. Make sure that you have a light switch at the top of the stairs and the bottom of the stairs. If you do not have them, ask someone to add them for you. What else can I do to help prevent falls? Wear shoes that: Do not have high heels. Have rubber bottoms. Are comfortable and fit you well. Are closed at the toe. Do not wear sandals. If you use a stepladder: Make sure that it is fully opened. Do not climb a closed stepladder. Make sure that both sides of the stepladder are locked into place. Ask someone to hold it for you, if possible. Clearly mark and make sure that you can see: Any grab bars or handrails. First and last steps. Where the edge of each step is. Use tools that  help you move around (mobility aids) if they are needed. These include: Canes. Walkers. Scooters. Crutches. Turn on the lights when you go into a dark area. Replace any light bulbs as soon as they burn out. Set up your furniture so you have a clear path. Avoid moving your furniture around. If any of your floors are uneven, fix them. If there are any pets around you, be aware of where they are. Review your medicines with your doctor. Some medicines can make you feel dizzy. This can increase your chance of falling. Ask your doctor what other things that you can do to help prevent falls. This information is not intended to replace advice given to you by your health care provider. Make sure you discuss any questions you have with your health care provider. Document Released: 03/06/2009 Document Revised: 10/16/2015 Document Reviewed: 06/14/2014 Elsevier Interactive Patient Education  2017 [employer].

## 2021-12-01 ENCOUNTER — Ambulatory Visit (HOSPITAL_BASED_OUTPATIENT_CLINIC_OR_DEPARTMENT_OTHER)
Admission: RE | Admit: 2021-12-01 | Discharge: 2021-12-01 | Disposition: A | Discharge status: Home / Self Care | Payer: Medicare Other | Source: Ambulatory Visit | Attending: Pulmonary Disease | Admitting: Pulmonary Disease

## 2021-12-01 DIAGNOSIS — J61 Pneumoconiosis due to asbestos and other mineral fibers: Secondary | ICD-10-CM | POA: Insufficient documentation

## 2021-12-01 DIAGNOSIS — J8410 Pulmonary fibrosis, unspecified: Secondary | ICD-10-CM | POA: Diagnosis not present

## 2021-12-01 DIAGNOSIS — J479 Bronchiectasis, uncomplicated: Secondary | ICD-10-CM | POA: Diagnosis not present

## 2021-12-02 ENCOUNTER — Other Ambulatory Visit (HOSPITAL_BASED_OUTPATIENT_CLINIC_OR_DEPARTMENT_OTHER): Payer: Medicare Other

## 2021-12-03 ENCOUNTER — Encounter: Payer: Self-pay | Admitting: Internal Medicine

## 2021-12-03 ENCOUNTER — Ambulatory Visit (INDEPENDENT_AMBULATORY_CARE_PROVIDER_SITE_OTHER): Payer: Medicare Other | Admitting: Internal Medicine

## 2021-12-03 VITALS — BP 132/68 | HR 68 | Temp 98.0°F | Resp 16 | Ht 75.0 in | Wt 204.1 lb

## 2021-12-03 DIAGNOSIS — E1140 Type 2 diabetes mellitus with diabetic neuropathy, unspecified: Secondary | ICD-10-CM | POA: Diagnosis not present

## 2021-12-03 DIAGNOSIS — Z7185 Encounter for immunization safety counseling: Secondary | ICD-10-CM

## 2021-12-03 DIAGNOSIS — E7800 Pure hypercholesterolemia, unspecified: Secondary | ICD-10-CM | POA: Diagnosis not present

## 2021-12-03 DIAGNOSIS — I10 Essential (primary) hypertension: Secondary | ICD-10-CM

## 2021-12-03 DIAGNOSIS — Z23 Encounter for immunization: Secondary | ICD-10-CM | POA: Diagnosis not present

## 2021-12-03 DIAGNOSIS — Z1211 Encounter for screening for malignant neoplasm of colon: Secondary | ICD-10-CM

## 2021-12-03 LAB — CBC WITH DIFFERENTIAL/PLATELET
Basophils Absolute: 0 10*3/uL (ref 0.0–0.1)
Basophils Relative: 0.6 % (ref 0.0–3.0)
Eosinophils Absolute: 0.2 10*3/uL (ref 0.0–0.7)
Eosinophils Relative: 4 % (ref 0.0–5.0)
HCT: 35.1 % — ABNORMAL LOW (ref 39.0–52.0)
Hemoglobin: 11.7 g/dL — ABNORMAL LOW (ref 13.0–17.0)
Lymphocytes Relative: 19.5 % (ref 12.0–46.0)
Lymphs Abs: 1.1 10*3/uL (ref 0.7–4.0)
MCHC: 33.5 g/dL (ref 30.0–36.0)
MCV: 94.9 fl (ref 78.0–100.0)
Monocytes Absolute: 0.5 10*3/uL (ref 0.1–1.0)
Monocytes Relative: 9.1 % (ref 3.0–12.0)
Neutro Abs: 3.8 10*3/uL (ref 1.4–7.7)
Neutrophils Relative %: 66.8 % (ref 43.0–77.0)
Platelets: 272 10*3/uL (ref 150.0–400.0)
RBC: 3.7 Mil/uL — ABNORMAL LOW (ref 4.22–5.81)
RDW: 13.9 % (ref 11.5–15.5)
WBC: 5.7 10*3/uL (ref 4.0–10.5)

## 2021-12-03 LAB — COMPREHENSIVE METABOLIC PANEL
ALT: 18 U/L (ref 0–53)
AST: 23 U/L (ref 0–37)
Albumin: 4.1 g/dL (ref 3.5–5.2)
Alkaline Phosphatase: 61 U/L (ref 39–117)
BUN: 20 mg/dL (ref 6–23)
CO2: 27 mEq/L (ref 19–32)
Calcium: 9.7 mg/dL (ref 8.4–10.5)
Chloride: 102 mEq/L (ref 96–112)
Creatinine, Ser: 0.93 mg/dL (ref 0.40–1.50)
GFR: 76.13 mL/min (ref >60.00)
Glucose, Bld: 107 mg/dL — ABNORMAL HIGH (ref 70–99)
Potassium: 4.6 mEq/L (ref 3.5–5.1)
Sodium: 135 mEq/L (ref 135–145)
Total Bilirubin: 0.7 mg/dL (ref 0.2–1.2)
Total Protein: 6.8 g/dL (ref 6.0–8.3)

## 2021-12-03 LAB — LIPID PANEL
Cholesterol: 124 mg/dL (ref 0–200)
HDL: 54.1 mg/dL (ref >39.00)
LDL Cholesterol: 59 mg/dL (ref 0–99)
NonHDL: 70.03
Total CHOL/HDL Ratio: 2
Triglycerides: 53 mg/dL (ref 0.0–149.0)
VLDL: 10.6 mg/dL (ref 0.0–40.0)

## 2021-12-03 LAB — HEMOGLOBIN A1C: Hgb A1c MFr Bld: 6.2 % (ref 4.6–6.5)

## 2021-12-03 MED ORDER — TETANUS-DIPHTH-ACELL PERTUSSIS 5-2.5-18.5 LF-MCG/0.5 IM SUSP: 0.5000 mL | Freq: Once | INTRAMUSCULAR | 0 refills | Status: AC

## 2021-12-03 NOTE — Assessment & Plan Note (Signed)
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 Situation discussed with the patient, he has a history of longevity in his family, he would like to pursue a colonoscopy.  Refer to GI for consideration -Advance directives: Information provided.

## 2021-12-03 NOTE — Progress Notes (Signed)
Subjective:    Patient ID: Chase Martinez, male    DOB: 1941-04-13, 81 y.o.   MRN: [id number]  DOS:  12/03/2021 Type of visit - description: Follow-up  Since the last office visit is doing well. We review his chronic medical problems and also talk about preventive care.  Denies fever chills No chest pain difficulty breathing no edema No nausea vomiting.  No diarrhea or constipation.  No blood in the stools. 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   Premature atrial contractions     Past Surgical History:  Procedure Laterality Date   CATARACT EXTRACTION Bilateral 2015   PACEMAKER IMPLANT N/A 04/03/2020   Procedure: PACEMAKER IMPLANT;  Surgeon: Vickie Epley, MD;  Location: [facility];  Service: Cardiovascular;  Laterality: N/A;   PILONIDAL CYST EXCISION     REFRACTIVE SURGERY     SHOULDER SURGERY     right   TONSILLECTOMY      Current Outpatient Medications  Medication Instructions   atorvastatin (LIPITOR) 20 MG tablet TAKE 1 TABLET DAILY   Fish Oil 1,000 mg, Oral, Daily,     furosemide (LASIX) 20 MG tablet TAKE 1 TABLET DAILY   glucose blood (FREESTYLE LITE) test strip USE TO CHECK BLOOD SUGAR NOT MORE THAN TWICE A DAY   JANUVIA 100 MG tablet TAKE 1 TABLET DAILY   Lancets (FREESTYLE) lancets USE AS DIRECTED TO CHECK BLOOD SUGARS 3 TO 4 TIMES DAILY   metFORMIN (GLUCOPHAGE) 1000 MG tablet TAKE 1 TABLET TWICE A DAY WITH MEALS   multivitamin (THERAGRAN) per tablet 1 tablet, Oral, Daily,     pioglitazone (ACTOS) 30 MG tablet TAKE 1 TABLET DAILY   tamsulosin (FLOMAX) 0.4 MG CAPS capsule TAKE 1 CAPSULE DAILY AFTER SUPPER   Tdap (BOOSTRIX) 5-2.5-18.5 LF-MCG/0.5 injection 0.5 mLs, Intramuscular,  Once   vitamin C 1,000  mg, Oral, Daily   Vitamin D3 2,000 Units, Oral, Daily       Objective:   Physical Exam BP 132/68   Pulse 68   Temp 98 F (36.7 C) (Oral)   Resp 16   Ht '6\' 3"'$  (1.905 m)   Wt 204 lb 2 oz (92.6 kg)   SpO2 96%   BMI 25.51 kg/m  General: Well developed, NAD, BMI noted Neck: No  thyromegaly  HEENT:  Normocephalic . Face symmetric, atraumatic Lungs:  CTA B Normal respiratory effort, no intercostal retractions, no accessory muscle use. Heart: RRR,  no murmur.  Abdomen:  Not distended, soft, non-tender. No rebound or rigidity.   Lower extremities: no pretibial edema bilaterally  Skin: Exposed areas without rash. Not pale. Not jaundice Neurologic:  alert & oriented X3.  Speech normal, gait appropriate for age and unassisted Strength symmetric and appropriate for age.  Psych: Cognition and judgment appear intact.  Cooperative with normal attention span and concentration.  Behavior appropriate. No anxious or depressed appearing.     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 Ronnald Ramp Nail dystrophy  Asbestosis dx 2019 Shingles 04-2020  PLAN: DM: Ambulatory CBGs in the low 100s.  Continue metformin, Actos, Januvia.  Checking labs, further advise w/ results; cont healthy life style  HTN: Ambulatory BPs are great in the 120s/50-60.  Heart rate 60-70.  On Lasix.  Labs. Hyperlipidemia: On Lipitor, no s/e.  Check FLP, adjust meds idf needed BPH: On Flomax, no symptoms.RF prn Asbestosis: Follow-up by pulmonary, last CT 12/01/2021. Preventive care reviewed RTC 6 months

## 2021-12-03 NOTE — Assessment & Plan Note (Addendum)
DM: Ambulatory CBGs in the low 100s.  Continue metformin, Actos, Januvia.  Checking labs, further advise w/ results; cont healthy life style  HTN: Ambulatory BPs are great in the 120s/50-60.  Heart rate 60-70.  On Lasix.  Labs. Hyperlipidemia: On Lipitor, no s/e.  Check FLP, adjust meds idf needed BPH: On Flomax, no symptoms.RF prn Asbestosis: Follow-up by pulmonary, last CT 12/01/2021. Preventive care reviewed RTC 6 months

## 2021-12-03 NOTE — Patient Instructions (Addendum)
Vaccines I recommend: Tetanus shot, see prescription COVID booster at your convenience Flu vaccine this fall  Continue checking your blood pressure and blood sugars.  BP GOAL is between 110/65 and  135/85. If it is consistently higher or lower, let me know     GO TO THE LAB : Get the blood work     [place], [place] back for a checkup in 6 months    "Living will", "[place] of attorney": Advanced care planning  (If you already have a living will or healthcare power of attorney, please bring the copy to be scanned in your chart.)  Advance care planning is a process that supports adults in  understanding and sharing their preferences regarding future medical care.   The patient's preferences are recorded in documents called Advance Directives.    Advanced directives are completed (and can be modified at any time) while the patient is in full mental capacity.   The documentation should be available at all times to the patient, the family and the healthcare providers.  Bring in a copy to be scanned in your chart is an excellent idea and is recommended   This legal documents direct treatment decision making and/or appoint a surrogate to make the decision if the patient is not capable to do so.    Advance directives can be documented in many types of formats,  documents have names such as:  Lliving will  Durable power of attorney for healthcare (healthcare proxy or healthcare power of attorney)  Combined directives  Physician orders for life-sustaining treatment    More information at:  [email or web address]

## 2021-12-08 ENCOUNTER — Encounter: Payer: Self-pay | Admitting: Pulmonary Disease

## 2021-12-09 NOTE — Telephone Encounter (Signed)
Pt already notified of HRCT results and has been scheduled an OV in August.

## 2021-12-19 ENCOUNTER — Other Ambulatory Visit: Payer: Self-pay | Admitting: Internal Medicine

## 2021-12-24 DIAGNOSIS — L603 Nail dystrophy: Secondary | ICD-10-CM | POA: Diagnosis not present

## 2021-12-24 DIAGNOSIS — E1151 Type 2 diabetes mellitus with diabetic peripheral angiopathy without gangrene: Secondary | ICD-10-CM | POA: Diagnosis not present

## 2021-12-24 DIAGNOSIS — I739 Peripheral vascular disease, unspecified: Secondary | ICD-10-CM | POA: Diagnosis not present

## 2021-12-24 DIAGNOSIS — B351 Tinea unguium: Secondary | ICD-10-CM | POA: Diagnosis not present

## 2021-12-24 DIAGNOSIS — L84 Corns and callosities: Secondary | ICD-10-CM | POA: Diagnosis not present

## 2021-12-26 IMAGING — DX DG CHEST 2V
2 series · 2 of 2 positions shown · non-contrast
Comparison: April 06, 2016

CLINICAL DATA: Asbestos exposure

EXAM:
CHEST - 2 VIEW

[chest pa]
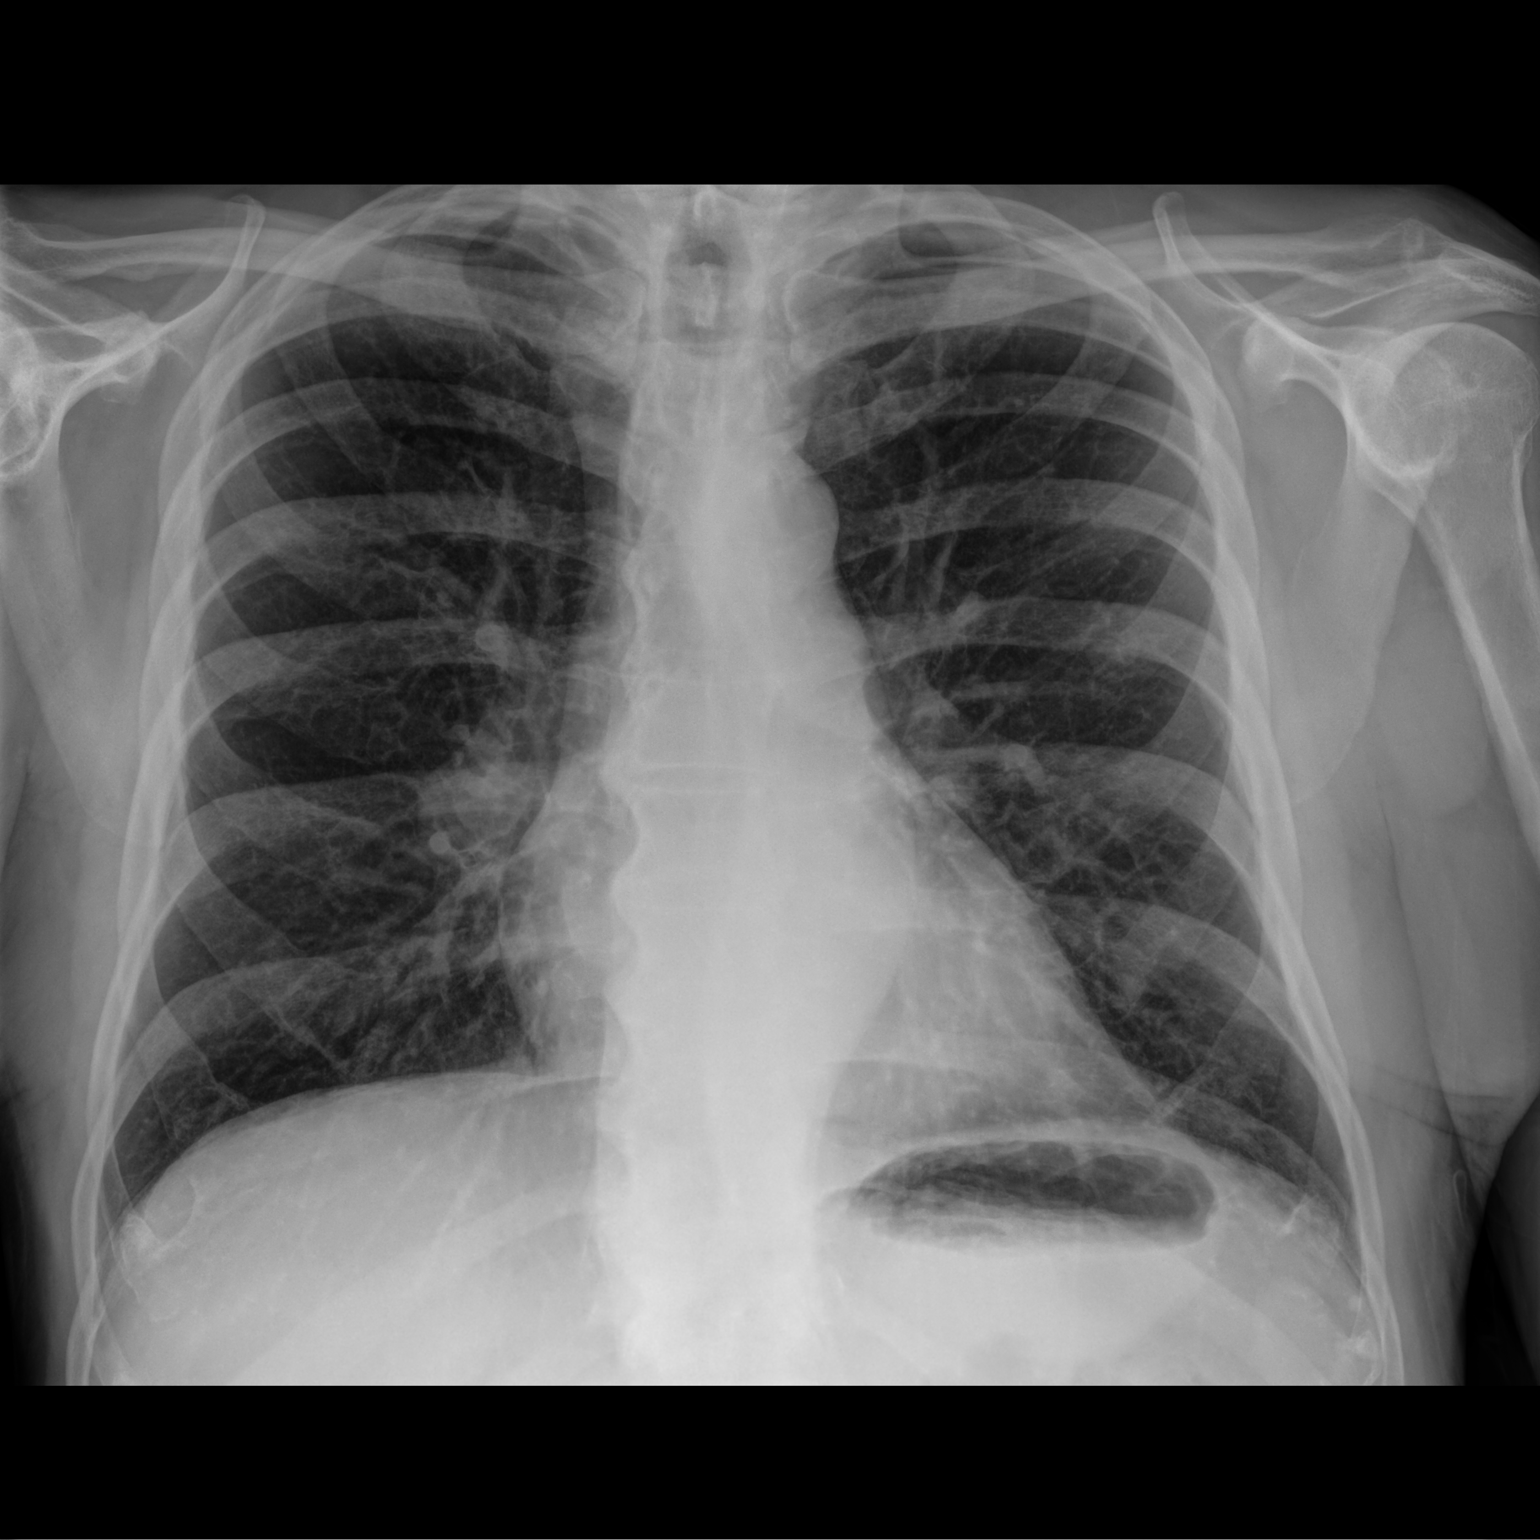

[chest lat]
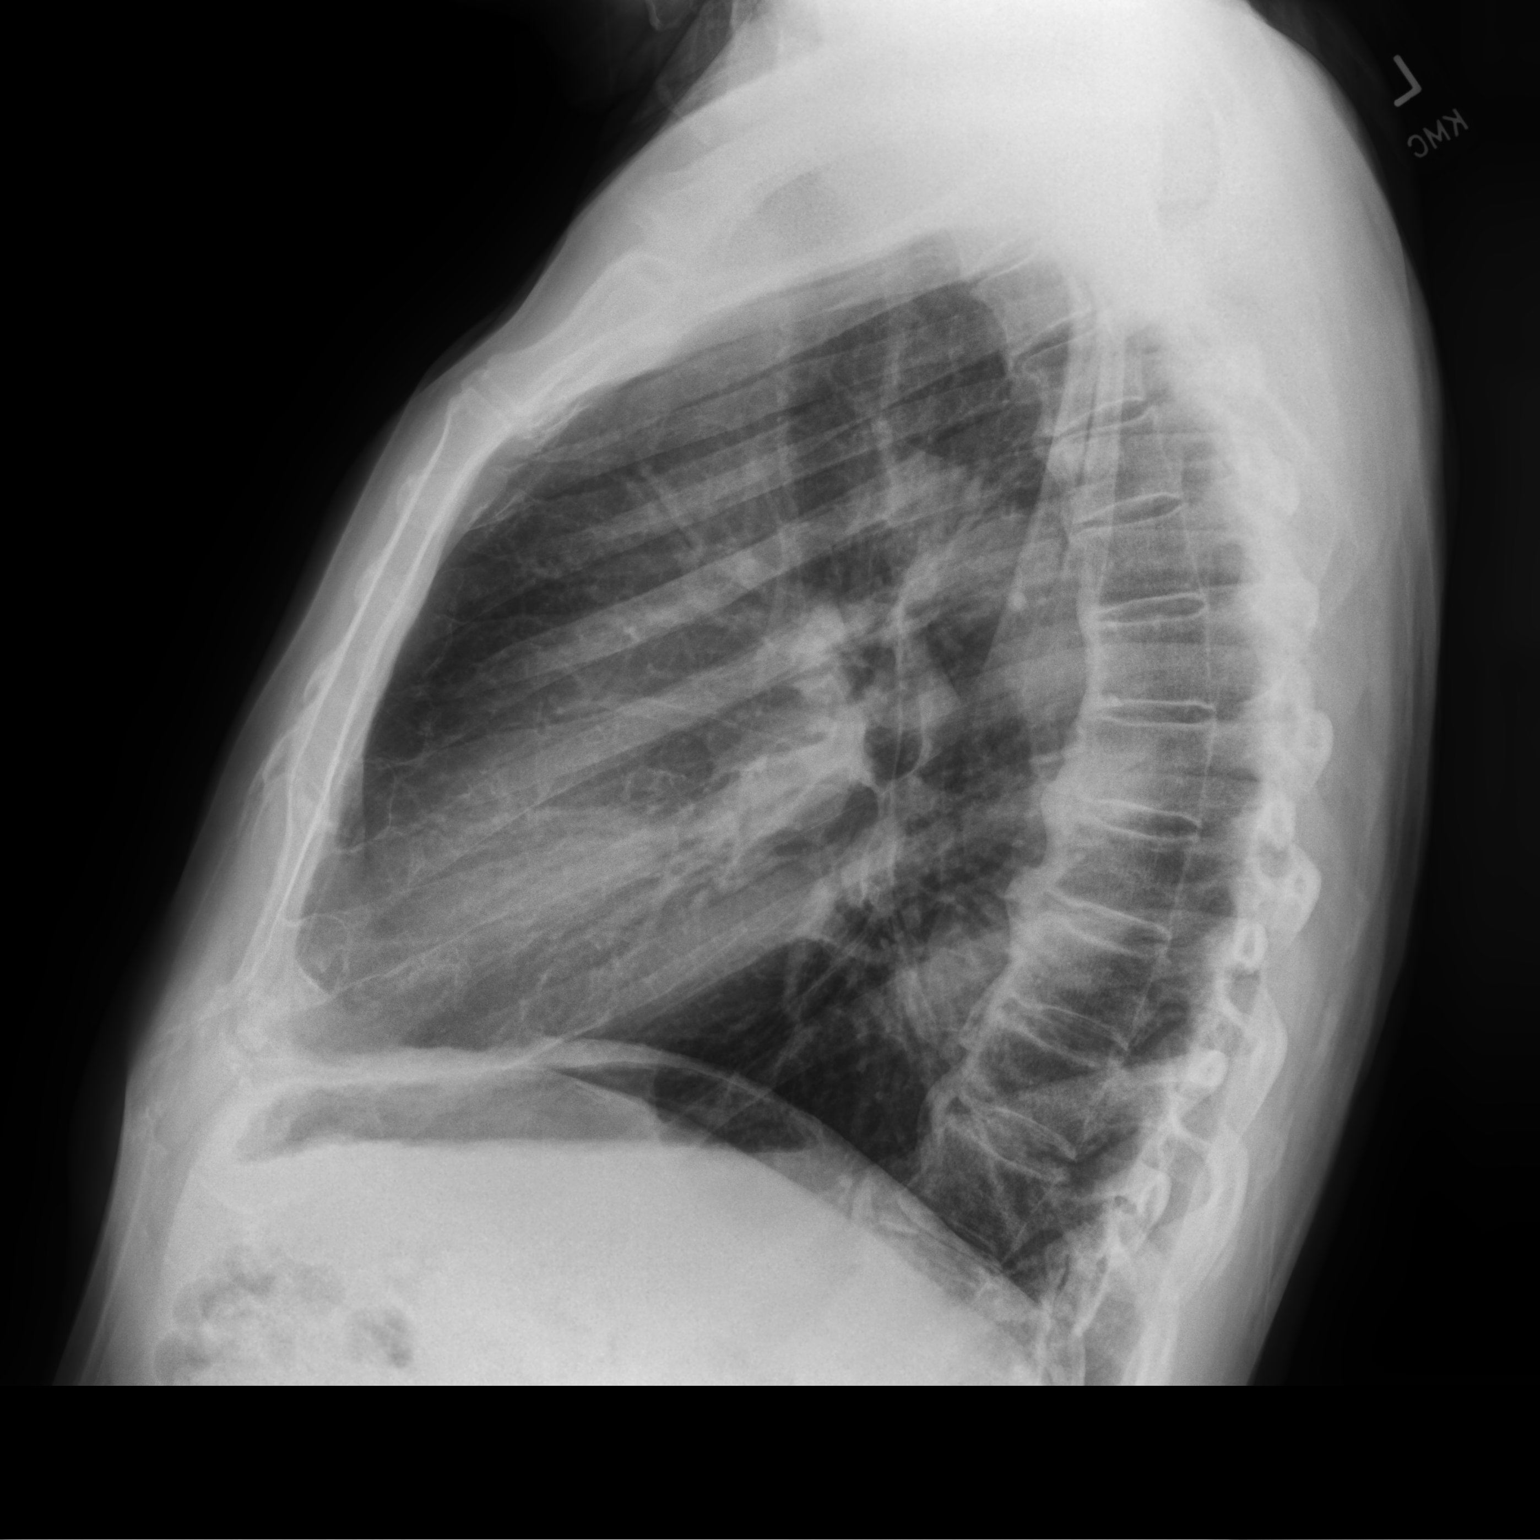

[2 of 2 positions shown; findings below may reference images not displayed]

FINDINGS: The lungs are clear. Heart size and pulmonary vascularity are
normal. No adenopathy. No pleural plaque or calcification evident.
There is degenerative change in the thoracic spine with diffuse
idiopathic skeletal hyperostosis.
IMPRESSION: Lungs clear. No pleural plaque or calcification evident. Cardiac
silhouette normal. No evident adenopathy. There is diffuse
idiopathic skeletal hyperostosis in the thoracic spine.

## 2021-12-29 ENCOUNTER — Other Ambulatory Visit: Payer: Self-pay | Admitting: Internal Medicine

## 2021-12-31 ENCOUNTER — Ambulatory Visit (INDEPENDENT_AMBULATORY_CARE_PROVIDER_SITE_OTHER): Payer: Medicare Other

## 2021-12-31 DIAGNOSIS — I442 Atrioventricular block, complete: Secondary | ICD-10-CM

## 2021-12-31 LAB — CUP PACEART REMOTE DEVICE CHECK
Battery Remaining Longevity: 115 mo
Battery Remaining Percentage: 88 %
Battery Voltage: 3.02 V
Brady Statistic AP VP Percent: 1 %
Brady Statistic AP VS Percent: 1 %
Brady Statistic AS VP Percent: 1 %
Brady Statistic AS VS Percent: 99 %
Brady Statistic RA Percent Paced: 1 %
Brady Statistic RV Percent Paced: 1 %
Date Time Interrogation Session: 20230810020012
Implantable Lead Implant Date: 20211111
Implantable Lead Implant Date: 20211111
Implantable Lead Location: 753859
Implantable Lead Location: 753860
Implantable Pulse Generator Implant Date: 20211111
Lead Channel Impedance Value: 450 Ohm
Lead Channel Impedance Value: 480 Ohm
Lead Channel Pacing Threshold Amplitude: 0.5 V
Lead Channel Pacing Threshold Amplitude: 1 V
Lead Channel Pacing Threshold Pulse Width: 0.4 ms
Lead Channel Pacing Threshold Pulse Width: 0.4 ms
Lead Channel Sensing Intrinsic Amplitude: 0.8 mV
Lead Channel Sensing Intrinsic Amplitude: 9.3 mV
Lead Channel Setting Pacing Amplitude: 2 V
Lead Channel Setting Pacing Amplitude: 2.5 V
Lead Channel Setting Pacing Pulse Width: 0.4 ms
Lead Channel Setting Sensing Sensitivity: 2 mV
Pulse Gen Model: 2272
Pulse Gen Serial Number: 3859811

## 2022-01-04 ENCOUNTER — Encounter: Payer: Self-pay | Admitting: Cardiology

## 2022-01-07 DIAGNOSIS — C44319 Basal cell carcinoma of skin of other parts of face: Secondary | ICD-10-CM | POA: Diagnosis not present

## 2022-01-07 DIAGNOSIS — L821 Other seborrheic keratosis: Secondary | ICD-10-CM | POA: Diagnosis not present

## 2022-01-07 DIAGNOSIS — L570 Actinic keratosis: Secondary | ICD-10-CM | POA: Diagnosis not present

## 2022-01-07 DIAGNOSIS — D485 Neoplasm of uncertain behavior of skin: Secondary | ICD-10-CM | POA: Diagnosis not present

## 2022-01-07 DIAGNOSIS — D225 Melanocytic nevi of trunk: Secondary | ICD-10-CM | POA: Diagnosis not present

## 2022-01-07 DIAGNOSIS — Z85828 Personal history of other malignant neoplasm of skin: Secondary | ICD-10-CM | POA: Diagnosis not present

## 2022-01-12 ENCOUNTER — Ambulatory Visit (INDEPENDENT_AMBULATORY_CARE_PROVIDER_SITE_OTHER): Payer: Medicare Other | Admitting: Pulmonary Disease

## 2022-01-12 ENCOUNTER — Encounter: Payer: Self-pay | Admitting: Pulmonary Disease

## 2022-01-12 VITALS — BP 138/72 | HR 72 | Temp 97.7°F | Ht 72.0 in | Wt 203.2 lb

## 2022-01-12 DIAGNOSIS — J849 Interstitial pulmonary disease, unspecified: Secondary | ICD-10-CM | POA: Diagnosis not present

## 2022-01-12 DIAGNOSIS — J61 Pneumoconiosis due to asbestos and other mineral fibers: Secondary | ICD-10-CM | POA: Diagnosis not present

## 2022-01-12 NOTE — Patient Instructions (Signed)
I am glad you are doing well with regard to your breathing The CT scan shows stable scarring of the lung We will order a follow-up CT in 1 year Return to clinic in 1 year after scan.

## 2022-01-12 NOTE — Progress Notes (Signed)
Chase Martinez    [id number]    November 02, 1940  Primary Care Physician:Paz, Alda Berthold, MD  Referring Physician: Colon Branch, MD [address] [place],  [place] [address]  Chief complaint: Follow-up for asbestosis  HPI: 81 year old with history of asbestos exposure, hypertension, hyperlipidemia, diabetes.  He is here for evaluation of asbestos-related lung disease.  Reports significant asbestos exposure while working in [employer] Denies any pulmonary symptoms.  No dyspnea, cough, sputum production, wheezing.  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  Later worked in Sports coach at [employer] Exposures: Reports exposure to asbestos while in [employer].  No mold, hot tub, Jacuzzi. Smoking history: 20-pack-year smoking history.  Quit in 1968 Travel history: Lived in [place], [place], [place].  Recent travel to [place]. Relevant family history: No significant family history of lung issues.  Interim history: Here for review of PFTs.  States that he is doing well with no issues Has an active lifestyle.  Plays golf 3 times a week  Outpatient Encounter Medications as of 01/12/2022  Medication Sig   Ascorbic Acid (VITAMIN C) 1000 MG tablet Take 1,000 mg by mouth daily.    atorvastatin (LIPITOR) 20 MG tablet TAKE 1 TABLET DAILY   Cholecalciferol (VITAMIN D3) 50 MCG (2000 UT) capsule Take 2,000 Units by mouth daily.    furosemide (LASIX) 20 MG tablet TAKE 1 TABLET DAILY   glucose blood (FREESTYLE LITE) test strip USE TO CHECK BLOOD SUGAR NOT MORE THAN TWICE A DAY   JANUVIA 100 MG tablet TAKE 1 TABLET DAILY   Lancets (FREESTYLE) lancets USE LANCET AS DIRECTED TO CHECK BLOOD SUGARS 3 TO 4 TIMES DAILY   metFORMIN (GLUCOPHAGE) 1000 MG tablet TAKE 1 TABLET TWICE A DAY WITH MEALS   multivitamin (THERAGRAN) per tablet Take 1 tablet by mouth daily.   Omega-3 Fatty Acids (FISH OIL) 1000 MG CAPS Take 1,000 mg by mouth  daily.   pioglitazone (ACTOS) 30 MG tablet TAKE 1 TABLET DAILY   tamsulosin (FLOMAX) 0.4 MG CAPS capsule TAKE 1 CAPSULE DAILY AFTER SUPPER   No facility-administered encounter medications on file as of 01/12/2022.   Physical Exam: Blood pressure 120/64, pulse 73, temperature 97.6 F (36.4 C), temperature source Oral, height '6\' 3"'$  (1.905 m), weight 198 lb (89.8 kg), SpO2 98 %. Gen:      No acute distress HEENT:  EOMI, sclera anicteric Neck:     No masses; no thyromegaly Lungs:    Clear to auscultation bilaterally; normal respiratory effort CV:         Regular rate and rhythm; no murmurs Abd:      + bowel sounds; soft, non-tender; no palpable masses, no distension Ext:    No edema; adequate peripheral perfusion Skin:      Warm and dry; no rash Neuro: alert and oriented x 3 Psych: normal mood and affect   Data Reviewed: Imaging: CT chest 07/10/2009- dependent bibasilar atelectasis.  High-resolution CT 10/20/17- minimal septal thickening, subpleural reticulation at the bases.  Mild air trapping.  Tiny calcified granulomas, small pleural plaques Hepatic steatosis, coronary atherosclerosis.  High-resolution CT 12/01/2021-mild subpleural reticulation and traction bronchiectasis.  Indeterminate for UIP.  Stable compared to 2019. I reviewed the images personally.  PFTs  11/17/2017 FVC 4.21 [92%), FEV1 3.43 [104%), F/F 81, TLC 91%, DLCO 75%, DLCO/VA 90% Minimal diffusion defect  12/16/2020 FVC 4.41 [98%], FEV1 3.68 [116%],  F/F 83, TLC 7.56 [98%], DLCO 36.15 [136%] Increased diffusion capacity.  Improvement in lung volumes and diffusion capacity compared to 2019  Assessment:  Asbestosis. Review of CT scan shows minimal scarring at the bases and minimal diffusion defect consistent with asbestosis.  He does have enlarged PA but no symptoms of pulmonary hypertension or cor pulmonale PFTs in 2022 actually show an improvement in lung capacity and diffusion He is asymptomatic and maintaining an  active lifestyle and we will continue monitoring him  Follow-up high-res CT in 1 year  Health maintenance 03/02/2017-influenza 10/30/2013-Prevnar 08/05/2016-Pneumovax  Plan/Recommendations: - High-res CT in 1 year  Marshell Garfinkel MD [facility] 01/12/2022, 3:47 PM  CC: Colon Branch, MD

## 2022-01-16 ENCOUNTER — Other Ambulatory Visit: Payer: Self-pay | Admitting: Internal Medicine

## 2022-01-25 DIAGNOSIS — Z03818 Encounter for observation for suspected exposure to other biological agents ruled out: Secondary | ICD-10-CM | POA: Diagnosis not present

## 2022-01-25 DIAGNOSIS — R531 Weakness: Secondary | ICD-10-CM | POA: Diagnosis not present

## 2022-01-25 DIAGNOSIS — R0602 Shortness of breath: Secondary | ICD-10-CM | POA: Diagnosis not present

## 2022-01-25 DIAGNOSIS — R051 Acute cough: Secondary | ICD-10-CM | POA: Diagnosis not present

## 2022-01-26 NOTE — Progress Notes (Signed)
Remote pacemaker transmission.   

## 2022-02-19 ENCOUNTER — Encounter: Payer: Self-pay | Admitting: Internal Medicine

## 2022-02-22 ENCOUNTER — Encounter: Payer: Self-pay | Admitting: Internal Medicine

## 2022-02-23 DIAGNOSIS — Z23 Encounter for immunization: Secondary | ICD-10-CM | POA: Diagnosis not present

## 2022-03-07 ENCOUNTER — Encounter: Payer: Self-pay | Admitting: Internal Medicine

## 2022-03-09 DIAGNOSIS — E1151 Type 2 diabetes mellitus with diabetic peripheral angiopathy without gangrene: Secondary | ICD-10-CM | POA: Diagnosis not present

## 2022-03-09 DIAGNOSIS — M21962 Unspecified acquired deformity of left lower leg: Secondary | ICD-10-CM | POA: Diagnosis not present

## 2022-03-09 DIAGNOSIS — M21961 Unspecified acquired deformity of right lower leg: Secondary | ICD-10-CM | POA: Diagnosis not present

## 2022-03-09 DIAGNOSIS — I739 Peripheral vascular disease, unspecified: Secondary | ICD-10-CM | POA: Diagnosis not present

## 2022-03-24 DIAGNOSIS — H401231 Low-tension glaucoma, bilateral, mild stage: Secondary | ICD-10-CM | POA: Diagnosis not present

## 2022-03-26 ENCOUNTER — Ambulatory Visit (INDEPENDENT_AMBULATORY_CARE_PROVIDER_SITE_OTHER): Payer: Medicare Other | Admitting: Medical

## 2022-03-26 VITALS — BP 136/70 | HR 77 | Temp 98.4°F | Resp 18 | Ht 72.0 in | Wt 207.0 lb

## 2022-03-26 DIAGNOSIS — B029 Zoster without complications: Secondary | ICD-10-CM | POA: Diagnosis not present

## 2022-03-26 MED ORDER — DOXYCYCLINE HYCLATE 100 MG PO TABS: 100.0000 mg | ORAL_TABLET | Freq: Two times a day (BID) | ORAL | 0 refills | Status: DC

## 2022-03-26 MED ORDER — FAMCICLOVIR 500 MG PO TABS: 500.0000 mg | ORAL_TABLET | Freq: Three times a day (TID) | ORAL | 0 refills | Status: DC

## 2022-03-26 NOTE — Progress Notes (Signed)
Subjective:    Patient ID: Chase Martinez, male    DOB: 02-02-1941, 81 y.o.   MRN: [id number]  HPI  Pt in for skin rash rt lower back. Pt noticed it wed evening. He states burning and itching sensation. Some sensitive to light touch. He thinks may have shingles.   He had shingles type eruptions before.  He had shingles vaccine in past.      Review of Systems  Constitutional:  Negative for chills, fatigue and fever.  Respiratory:  Negative for cough, chest tightness, shortness of breath and wheezing.   Cardiovascular:  Negative for chest pain and palpitations.  Gastrointestinal:  Negative for abdominal pain, blood in stool, diarrhea and vomiting.  Musculoskeletal:  Positive for back pain.  Skin:  Positive for rash.  Hematological:  Positive for adenopathy. Does not bruise/bleed easily.  Psychiatric/Behavioral:  Positive for behavioral problems and confusion.     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   Premature atrial contractions      Social History   Socioeconomic History   Marital status: Married    Spouse name: Not on file   Number of children: 2   Years of education: Not on file   Highest education level: Not on file  Occupational History   Occupation: Retired Therapist, art Engineering geologist)  Tobacco Use   Smoking status: Former    Packs/day: 1.00    Types: Cigarettes, Cigars    Quit date: 05/24/1966    Years since quitting: 55.8   Smokeless tobacco: Former    Types: Snuff    Quit date: 05/24/1988  Vaping Use   Vaping Use: Never used  Substance and Sexual Activity   Alcohol use: Yes    Alcohol/week: 7.0 - 14.0 standard drinks of alcohol    Types: 7 - 14 Glasses of wine per week    Comment: occasional beer and wine   Drug use: No   Sexual activity: Yes  Other Topics  Concern   Not on file  Social History Narrative   Household-- pt and wife    Son lives near by, daughter near [place] Strain: [place]  (11/04/2020)   Overall Financial Resource Strain (CARDIA)    Difficulty of Paying Living Expenses: Not hard at all  Food Insecurity: No Food Insecurity (11/04/2020)   Hunger Vital Sign    Worried About Running Out of Food in the Last Year: Never true    Ran Out of Food in the Last Year: Never true  Transportation Needs: No Transportation Needs (11/04/2020)   PRAPARE - Hydrologist (Medical): No    Lack of Transportation (Non-Medical): No  Physical Activity: Sufficiently Active (11/04/2020)   Exercise Vital Sign    Days of Exercise per Week: 4 days    Minutes of Exercise per Session: 60 min  Stress: No Stress Concern Present (11/04/2020)   [place]    Feeling of Stress : Not at all  Social Connections: [place] (11/04/2020)   Social Connection and Isolation Panel [NHANES]    Frequency of Communication with Friends and Family: More than three times a week    Frequency  of Social Gatherings with Friends and Family: More than three times a week    Attends Religious Services: More than 4 times per year    Active Member of Clubs or Organizations: Yes    Attends Archivist Meetings: More than 4 times per year    Marital Status: Married  Human resources officer Violence: Not At Risk (11/04/2020)   Humiliation, Afraid, Rape, and Kick questionnaire    Fear of Current or Ex-Partner: No    Emotionally Abused: No    Physically Abused: No    Sexually Abused: No    Past Surgical History:  Procedure Laterality Date   CATARACT EXTRACTION Bilateral 2015   PACEMAKER IMPLANT N/A 04/03/2020   Procedure: PACEMAKER IMPLANT;  Surgeon: Vickie Epley, MD;  Location: [facility];  Service:  Cardiovascular;  Laterality: N/A;   PILONIDAL CYST EXCISION     REFRACTIVE SURGERY     SHOULDER SURGERY     right   TONSILLECTOMY      Family History  Problem Relation Age of Onset   Hypertension Mother    Glaucoma Mother    Dementia Father    Parkinsonism Sister    Thyroid cancer Son    Coronary artery disease Neg Hx    Diabetes Neg Hx    Stroke Neg Hx    Prostate cancer Neg Hx    Colon cancer Neg Hx    Esophageal cancer Neg Hx    Stomach cancer Neg Hx     No Known Allergies  Current Outpatient Medications on File Prior to Visit  Medication Sig Dispense Refill   Ascorbic Acid (VITAMIN C) 1000 MG tablet Take 1,000 mg by mouth daily.      atorvastatin (LIPITOR) 20 MG tablet TAKE 1 TABLET DAILY 90 tablet 1   Cholecalciferol (VITAMIN D3) 50 MCG (2000 UT) capsule Take 2,000 Units by mouth daily.      furosemide (LASIX) 20 MG tablet TAKE 1 TABLET DAILY 90 tablet 1   glucose blood (FREESTYLE LITE) test strip USE TO CHECK BLOOD SUGAR NOT MORE THAN TWICE A DAY 100 strip 12   JANUVIA 100 MG tablet TAKE 1 TABLET DAILY 90 tablet 3   Lancets (FREESTYLE) lancets USE LANCET AS DIRECTED TO CHECK BLOOD SUGARS 3 TO 4 TIMES DAILY 300 each 12   metFORMIN (GLUCOPHAGE) 1000 MG tablet TAKE 1 TABLET TWICE A DAY WITH MEALS 180 tablet 3   multivitamin (THERAGRAN) per tablet Take 1 tablet by mouth daily.     Omega-3 Fatty Acids (FISH OIL) 1000 MG CAPS Take 1,000 mg by mouth daily.     pioglitazone (ACTOS) 30 MG tablet TAKE 1 TABLET DAILY 90 tablet 1   tamsulosin (FLOMAX) 0.4 MG CAPS capsule TAKE 1 CAPSULE DAILY AFTER SUPPER 90 capsule 1   No current facility-administered medications on file prior to visit.    BP 136/70   Pulse 77   Temp 98.4 F (36.9 C)   Resp 18   Ht 6' (1.829 m)   Wt 207 lb (93.9 kg)   SpO2 98%   BMI 28.07 kg/m        Objective:   Physical Exam   General- No acute distress. Pleasant patient. Neck- Full range of motion, no jvd Lungs- Clear, even and  unlabored. Heart- regular rate and rhythm. Neurologic- CNII- XII grossly intact.   Rt lower back- 2 cm x 1 cm mild red area. Mild red and raised. Faint induration. No vesicles presently.  Assessment & Plan:   Patient Instructions  Rt lower back area early shingles eruption. 2nd day since onset of area. Start famvir 500 mg three times daily for 7 days.  The area is slight indurated but not warm. We are approaching the weekend. Will make doxycycline available if the current area expands, get very tender and warm(like secondary skin infection). Doxycycine would cover skin if occurs. (Print rx to use if needed)    Follow up in 7-10 days or sooner if needed  [employer], PA-C

## 2022-03-26 NOTE — Patient Instructions (Addendum)
Rt lower back area early shingles eruption. 2nd day since onset of area. Start famvir 500 mg three times daily for 7 days.  The area is slight indurated but not warm. We are approaching the weekend. Will make doxycycline available if the current area expands, get very tender and warm(like secondary skin infection). Doxycycline would cover skin if occurs. (Print rx to use if needed)  Follow up in 7  or sooner if needed.(Try to schedule next week Friday 1:20)

## 2022-04-01 ENCOUNTER — Ambulatory Visit (INDEPENDENT_AMBULATORY_CARE_PROVIDER_SITE_OTHER): Payer: Medicare Other

## 2022-04-01 DIAGNOSIS — I442 Atrioventricular block, complete: Secondary | ICD-10-CM | POA: Diagnosis not present

## 2022-04-01 LAB — CUP PACEART REMOTE DEVICE CHECK
Battery Remaining Longevity: 111 mo
Battery Remaining Percentage: 86 %
Battery Voltage: 3.01 V
Brady Statistic AP VP Percent: 1 %
Brady Statistic AP VS Percent: 1 %
Brady Statistic AS VP Percent: 1 %
Brady Statistic AS VS Percent: 99 %
Brady Statistic RA Percent Paced: 1 %
Brady Statistic RV Percent Paced: 1 %
Date Time Interrogation Session: 20231109020013
Implantable Lead Connection Status: 753985
Implantable Lead Connection Status: 753985
Implantable Lead Implant Date: 20211111
Implantable Lead Implant Date: 20211111
Implantable Lead Location: 753859
Implantable Lead Location: 753860
Implantable Pulse Generator Implant Date: 20211111
Lead Channel Impedance Value: 430 Ohm
Lead Channel Impedance Value: 480 Ohm
Lead Channel Pacing Threshold Amplitude: 0.5 V
Lead Channel Pacing Threshold Amplitude: 1 V
Lead Channel Pacing Threshold Pulse Width: 0.4 ms
Lead Channel Pacing Threshold Pulse Width: 0.4 ms
Lead Channel Sensing Intrinsic Amplitude: 1.6 mV
Lead Channel Sensing Intrinsic Amplitude: 7.5 mV
Lead Channel Setting Pacing Amplitude: 2 V
Lead Channel Setting Pacing Amplitude: 2.5 V
Lead Channel Setting Pacing Pulse Width: 0.4 ms
Lead Channel Setting Sensing Sensitivity: 2 mV
Pulse Gen Model: 2272
Pulse Gen Serial Number: 3859811

## 2022-04-02 ENCOUNTER — Ambulatory Visit: Payer: Medicare Other | Admitting: Internal Medicine

## 2022-04-06 ENCOUNTER — Other Ambulatory Visit: Payer: Self-pay | Admitting: Internal Medicine

## 2022-04-09 NOTE — Progress Notes (Signed)
Remote pacemaker transmission.   

## 2022-04-16 ENCOUNTER — Other Ambulatory Visit: Payer: Self-pay | Admitting: Internal Medicine

## 2022-05-03 ENCOUNTER — Encounter: Payer: Self-pay | Admitting: *Deleted

## 2022-05-18 ENCOUNTER — Encounter: Payer: Self-pay | Admitting: Internal Medicine

## 2022-05-18 ENCOUNTER — Ambulatory Visit (INDEPENDENT_AMBULATORY_CARE_PROVIDER_SITE_OTHER): Payer: Medicare Other | Admitting: Internal Medicine

## 2022-05-18 VITALS — BP 120/62 | HR 77 | Ht 72.0 in | Wt 205.4 lb

## 2022-05-18 DIAGNOSIS — Z86010 Personal history of colonic polyps: Secondary | ICD-10-CM | POA: Diagnosis not present

## 2022-05-18 NOTE — Patient Instructions (Signed)
Return to clinic if symptoms changes follow up as needed  If you are age 83 or older, your body mass index should be between 23-30. Your Body mass index is 27.86 kg/m. If this is out of the aforementioned range listed, please consider follow up with your Primary Care Provider.  If you are age 11 or younger, your body mass index should be between 19-25. Your Body mass index is 27.86 kg/m. If this is out of the aformentioned range listed, please consider follow up with your Primary Care Provider.   ________________________________________________________  The [facility] providers would like to encourage you to use [facility] to communicate with providers for non-urgent requests or questions.  Due to long hold times on the telephone, sending your provider a message by [facility] may be a faster and more efficient way to get a response.  Please allow 48 business hours for a response.  Please remember that this is for non-urgent requests.

## 2022-05-18 NOTE — Progress Notes (Signed)
Patient ID: Chase Martinez, male   DOB: [birth year], 81 y.o.   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  He is here alone today.  He is known to me from his last colonoscopy which I performed on 08/17/2016.  This exam was surveillance after personal history of adenomatous colon polyps 5 years previously.  This exam was completed with a good prep.  There was a 5 mm descending colon adenoma removed.  There was diverticulosis in the sigmoid and ascending colon and small internal hemorrhoids.  He reports that he is feeling well.  He has no specific GI complaints.  He is aware that colonoscopy typically stops around age 25 for the purposes of screening and surveillance but wanted to discuss this with me personally.  He notes that his parents were fortunate to live long lives with his mother dying around 67 years old and his father at age 83.  He states that he "plans to be here for a bit longer" and wanted to discuss colonoscopy.  Regular bowel habits without diarrhea or constipation.  No visible blood in stool.  No abdominal pain.  No unexpected weight loss.  No upper GI or hepatobiliary complaint.  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      atorvastatin (LIPITOR) 20 MG tablet TAKE 1 TABLET DAILY 90 tablet 1   Cholecalciferol (VITAMIN D3) 50 MCG (2000 UT) capsule Take 2,000 Units by mouth daily.      doxycycline (VIBRA-TABS) 100 MG tablet Take 1 tablet (100 mg total) by mouth 2 (two) times daily. 14 tablet 0   famciclovir (FAMVIR) 500 MG tablet Take 1 tablet (500 mg total) by mouth 3 (three) times daily. 21 tablet 0   furosemide (LASIX) 20 MG tablet TAKE 1 TABLET DAILY 90 tablet 1   glucose blood (FREESTYLE LITE) test strip USE TO CHECK BLOOD SUGAR, NOT MORE THAN TWICE A DAY. 100 strip 12   JANUVIA 100 MG tablet TAKE 1 TABLET DAILY 90 tablet 3   Lancets (FREESTYLE) lancets USE LANCET AS DIRECTED TO CHECK BLOOD SUGARS 3 TO 4 TIMES DAILY 300 each 12   metFORMIN (GLUCOPHAGE) 1000 MG tablet TAKE 1 TABLET TWICE A DAY WITH MEALS 180 tablet 3   multivitamin (THERAGRAN) per tablet Take 1 tablet by mouth daily.  Omega-3 Fatty Acids (FISH OIL) 1000 MG CAPS Take 1,000 mg by mouth daily.     pioglitazone (ACTOS) 30 MG tablet TAKE 1 TABLET DAILY 90 tablet 1   tamsulosin (FLOMAX) 0.4 MG CAPS capsule TAKE 1 CAPSULE DAILY AFTER SUPPER 90 capsule 3   No facility-administered medications prior to visit.    No Known Allergies  Family History  Problem Relation Age of Onset   Hypertension Mother    Glaucoma Mother    Dementia Father    Parkinsonism Sister    Thyroid cancer Son    Coronary artery disease Neg Hx    Diabetes Neg Hx    Stroke Neg Hx    Prostate cancer Neg Hx    Colon cancer Neg Hx    Esophageal cancer Neg Hx    Stomach cancer Neg Hx     Social History   Tobacco Use    Smoking status: Former    Packs/day: 1.00    Types: Cigarettes, Cigars    Quit date: 05/24/1966    Years since quitting: 56.0   Smokeless tobacco: Former    Types: Snuff    Quit date: 05/24/1988  Vaping Use   Vaping Use: Never used  Substance Use Topics   Alcohol use: Yes    Alcohol/week: 7.0 - 14.0 standard drinks of alcohol    Types: 7 - 14 Glasses of wine per week    Comment: occasional beer and wine   Drug use: No    ROS: As per history of present illness, otherwise negative  BP 120/62   Pulse 77   Ht 6' (1.829 m)   Wt 205 lb 6.4 oz (93.2 kg)   SpO2 97%   BMI 27.86 kg/m  Gen: awake, alert, NAD HEENT: anicteric Abd: soft, NT/ND, +BS throughout Neuro: nonfocal   RELEVANT LABS AND IMAGING: CBC    Component Value Date/Time   WBC 5.7 12/03/2021 0843   RBC 3.70 (L) 12/03/2021 0843   HGB 11.7 (L) 12/03/2021 0843   HGB 11.8 (L) 03/26/2020 1430   HCT 35.1 (L) 12/03/2021 0843   HCT 35.6 (L) 03/26/2020 1430   PLT 272.0 12/03/2021 0843   PLT 279 03/26/2020 1430   MCV 94.9 12/03/2021 0843   MCV 95 03/26/2020 1430   MCH 31.6 03/26/2020 1430   MCH 32.2 02/28/2020 0918   MCHC 33.5 12/03/2021 0843   RDW 13.9 12/03/2021 0843   RDW 12.4 03/26/2020 1430   LYMPHSABS 1.1 12/03/2021 0843   LYMPHSABS 1.6 03/26/2020 1430   MONOABS 0.5 12/03/2021 0843   EOSABS 0.2 12/03/2021 0843   EOSABS 0.2 03/26/2020 1430   BASOSABS 0.0 12/03/2021 0843   BASOSABS 0.1 03/26/2020 1430    CMP     Component Value Date/Time   NA 135 12/03/2021 0843   NA 140 03/26/2020 1430   K 4.6 12/03/2021 0843   CL 102 12/03/2021 0843   CO2 27 12/03/2021 0843   GLUCOSE 107 (H) 12/03/2021 0843   BUN 20 12/03/2021 0843   BUN 16 03/26/2020 1430   CREATININE 0.93 12/03/2021 0843   CREATININE 1.01 02/28/2020 0918   CALCIUM 9.7 12/03/2021 0843   PROT 6.8 12/03/2021 0843   ALBUMIN 4.1 12/03/2021 0843   AST 23 12/03/2021 0843   ALT 18 12/03/2021 0843   ALKPHOS 61 12/03/2021 0843   BILITOT 0.7  12/03/2021 0843   GFRNONAA 63 03/26/2020 1430   GFRNONAA 76 03/19/2016 1412   GFRAA 72 03/26/2020 1430   GFRAA 88 03/19/2016 1412  ASSESSMENT/PLAN: 81 year old male with a history of nonadvanced adenomatous colon polyps, colonic diverticulosis, 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   History of adenomatous colon polyps --we had a thorough discussion regarding surveillance colonoscopy as it relates to his current medical conditions, personal history and age.  He is certainly fit enough to repeat colonoscopy but we did review that the risk benefit of colonoscopy evens out around age 57 for most people on a population perspective.  I am certainly willing to repeat colonoscopy given his level of fitness.  Other options would include colonoscopy, FIT, and discontinuation of surveillance.  Cologuard would not be a good option for him.  After our discussion together he prefers discontinuation of colonoscopy for the purposes of polyp surveillance.  He understands that this does not mean we would not fully evaluate troublesome GI symptoms should that occur for him in the future.  After our discussion both he and are comfortable with his decision.  He can return as needed    Cc:Paz, Alda Berthold, [place],  [place] [id number]

## 2022-05-28 ENCOUNTER — Other Ambulatory Visit: Payer: Self-pay | Admitting: Internal Medicine

## 2022-06-02 ENCOUNTER — Other Ambulatory Visit: Payer: Self-pay | Admitting: Internal Medicine

## 2022-06-08 ENCOUNTER — Encounter: Payer: Self-pay | Admitting: Internal Medicine

## 2022-06-08 ENCOUNTER — Ambulatory Visit (INDEPENDENT_AMBULATORY_CARE_PROVIDER_SITE_OTHER): Payer: Medicare Other | Admitting: Internal Medicine

## 2022-06-08 VITALS — BP 126/62 | HR 68 | Temp 97.7°F | Resp 16 | Ht 72.0 in | Wt 208.4 lb

## 2022-06-08 DIAGNOSIS — E1140 Type 2 diabetes mellitus with diabetic neuropathy, unspecified: Secondary | ICD-10-CM

## 2022-06-08 DIAGNOSIS — E1151 Type 2 diabetes mellitus with diabetic peripheral angiopathy without gangrene: Secondary | ICD-10-CM | POA: Diagnosis not present

## 2022-06-08 DIAGNOSIS — I739 Peripheral vascular disease, unspecified: Secondary | ICD-10-CM | POA: Diagnosis not present

## 2022-06-08 DIAGNOSIS — I10 Essential (primary) hypertension: Secondary | ICD-10-CM

## 2022-06-08 DIAGNOSIS — B351 Tinea unguium: Secondary | ICD-10-CM | POA: Diagnosis not present

## 2022-06-08 DIAGNOSIS — Z23 Encounter for immunization: Secondary | ICD-10-CM

## 2022-06-08 DIAGNOSIS — E7800 Pure hypercholesterolemia, unspecified: Secondary | ICD-10-CM

## 2022-06-08 DIAGNOSIS — L84 Corns and callosities: Secondary | ICD-10-CM | POA: Diagnosis not present

## 2022-06-08 DIAGNOSIS — L603 Nail dystrophy: Secondary | ICD-10-CM | POA: Diagnosis not present

## 2022-06-08 LAB — BASIC METABOLIC PANEL
BUN: 21 mg/dL (ref 6–23)
CO2: 26 mEq/L (ref 19–32)
Calcium: 9.4 mg/dL (ref 8.4–10.5)
Chloride: 102 mEq/L (ref 96–112)
Creatinine, Ser: 0.91 mg/dL (ref 0.40–1.50)
GFR: 77.86 mL/min (ref >60.00)
Glucose, Bld: 120 mg/dL — ABNORMAL HIGH (ref 70–99)
Potassium: 4.3 mEq/L (ref 3.5–5.1)
Sodium: 136 mEq/L (ref 135–145)

## 2022-06-08 LAB — HEMOGLOBIN A1C: Hgb A1c MFr Bld: 6.4 % (ref 4.6–6.5)

## 2022-06-08 LAB — HM DIABETES FOOT EXAM

## 2022-06-08 LAB — MICROALBUMIN / CREATININE URINE RATIO
Creatinine,U: 48.4 mg/dL
Microalb Creat Ratio: 1.5 mg/g (ref 0.0–30.0)
Microalb, Ur: 0.7 mg/dL (ref 0.0–1.9)

## 2022-06-08 NOTE — Patient Instructions (Signed)
It was good to see you today  Continue same medications and continue checking your blood sugars.   BP GOAL is between 110/65 and  135/85. If it is consistently higher or lower, let me know   GO TO THE LAB : Get the blood work     [place], [place] back for   checkup in 5 to 6 months

## 2022-06-08 NOTE — Assessment & Plan Note (Signed)
DM: Ambulatory CBGs on average 116, no low sugar readings.  Continue metformin, Januvia, pioglitazone.  Will see the foot doctor today.  Check A1c and BMP. HTN: Ambulatory BPs in the 130s/67.  Continue Lasix. Asbestosis: Saw pulmonary 12-2021, they plan to redo a high-resolution CT 12/2022. Preventive care: Saw GI 04/2022, no further CCS  at this point. Had a COVID, RSV and flu shot already. PNM 20: Today RTC 6 months

## 2022-06-08 NOTE — Progress Notes (Signed)
Subjective:    Patient ID: Chase Martinez, male    DOB: 1940-12-13, 82 y.o.   MRN: [id number]  DOS:  06/08/2022 Type of visit - description: f/u  Routine follow-up.  Doing well. Ambulatory CBGs and BPs reviewed. Saw GI and pulmonary: Notes reviewed. Denies chest pain or difficulty breathing.   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   Internal hemorrhoids    Premature atrial contractions    Tubular adenoma of colon     Past Surgical History:  Procedure Laterality Date   CATARACT EXTRACTION Bilateral 2015   PACEMAKER IMPLANT N/A 04/03/2020   Procedure: PACEMAKER IMPLANT;  Surgeon: Vickie Epley, MD;  Location: [facility];  Service: Cardiovascular;  Laterality: N/A;   PILONIDAL CYST EXCISION     REFRACTIVE SURGERY     SHOULDER SURGERY     right   TONSILLECTOMY      Current Outpatient Medications  Medication Instructions   atorvastatin (LIPITOR) 20 mg, Oral, Daily   doxycycline (VIBRA-TABS) 100 mg, Oral, 2 times daily   famciclovir (FAMVIR) 500 mg, Oral, 3 times daily   Fish Oil 1,000 mg, Oral, Daily,     furosemide (LASIX) 20 MG tablet TAKE 1 TABLET DAILY   glucose blood (FREESTYLE LITE) test strip USE TO CHECK BLOOD SUGAR, NOT MORE THAN TWICE A DAY.   Lancets (FREESTYLE) lancets USE LANCET AS DIRECTED TO CHECK BLOOD SUGARS 3 TO 4 TIMES DAILY   metFORMIN (GLUCOPHAGE) 1000 MG tablet TAKE 1 TABLET TWICE A DAY WITH MEALS   multivitamin (THERAGRAN) per tablet 1 tablet, Oral, Daily,     pioglitazone (ACTOS) 30 mg, Oral, Daily   sitaGLIPtin (JANUVIA) 100 mg, Oral, Daily   tamsulosin (FLOMAX) 0.4 MG CAPS capsule TAKE 1 CAPSULE DAILY AFTER SUPPER   vitamin C 1,000 mg, Oral, Daily   Vitamin D3  2,000 Units, Oral, Daily       Objective:   Physical Exam BP 126/62   Pulse 68   Temp 97.7 F (36.5 C) (Oral)   Resp 16   Ht 6' (1.829 m)   Wt 208 lb 6 oz (94.5 kg)   SpO2 98%   BMI 28.26 kg/m  General:   Well developed, NAD, BMI noted. HEENT:  Normocephalic . Face symmetric, atraumatic Lungs:  CTA B Normal respiratory effort, no intercostal retractions, no accessory muscle use. Heart: RRR,  no murmur.  Lower extremities: no pretibial edema bilaterally  Skin: Not pale. Not jaundice Neurologic:  alert & oriented X3.  Speech normal, gait appropriate for age and unassisted Psych--  Cognition and judgment appear intact.  Cooperative with normal attention span and concentration.  Behavior appropriate. No anxious or depressed appearing.      Assessment   Assessment: DM: + neuropathy HTN Hyperlipidemia CV: ---PVCs  -- DR Thomasena Edis 03-2015, on CCB,BB ---High degree AV Block-pacemaker 04/03/2020 BPH BCC Dr. Ronnald Ramp Nail dystrophy  Asbestosis dx 2019 Shingles 04-2020  PLAN: DM: Ambulatory CBGs on average 116, no low sugar readings.  Continue metformin, Januvia, pioglitazone.  Will see the foot doctor today.  Check A1c and BMP. HTN: Ambulatory BPs in the 130s/67.  Continue Lasix. Asbestosis: Saw  pulmonary 12-2021, they plan to redo a high-resolution CT 12/2022. Preventive care: Saw GI 04/2022, no further CCS  at this point. Had a COVID, RSV and flu shot already. PNM 20: Today RTC 6 months

## 2022-06-11 ENCOUNTER — Encounter: Payer: Self-pay | Admitting: Internal Medicine

## 2022-06-22 ENCOUNTER — Telehealth: Payer: Self-pay | Admitting: Internal Medicine

## 2022-06-22 NOTE — Telephone Encounter (Signed)
Pt dropped off copy of his Power of Attorney for provider to see and to have on pt's chart. Document put at front office tray under providers name.

## 2022-06-28 ENCOUNTER — Ambulatory Visit (INDEPENDENT_AMBULATORY_CARE_PROVIDER_SITE_OTHER): Payer: Medicare Other | Admitting: Pulmonary Disease

## 2022-06-28 ENCOUNTER — Encounter: Payer: Self-pay | Admitting: Pulmonary Disease

## 2022-06-28 VITALS — BP 140/62 | HR 76 | Temp 97.7°F | Ht 72.0 in | Wt 211.0 lb

## 2022-06-28 DIAGNOSIS — J61 Pneumoconiosis due to asbestos and other mineral fibers: Secondary | ICD-10-CM

## 2022-06-28 DIAGNOSIS — J849 Interstitial pulmonary disease, unspecified: Secondary | ICD-10-CM | POA: Diagnosis not present

## 2022-06-28 NOTE — Patient Instructions (Signed)
I am glad you are doing well We have a CT scan and PFTs scheduled in August 2024.  Will see you back in clinic after these tests.

## 2022-06-28 NOTE — Progress Notes (Signed)
Chase Martinez    [id number]    [birth year]  Primary Care Physician:Paz, Alda Berthold, MD  Referring Physician: Colon Branch, MD [address] [place],  [place] [address]  Chief complaint: Follow-up for asbestosis  HPI: 82 y.o. with history of asbestos exposure, hypertension, hyperlipidemia, diabetes.  He is here for evaluation of asbestos-related lung disease.  Reports significant asbestos exposure while working in [employer] Denies any pulmonary symptoms.  No dyspnea, cough, sputum production, wheezing.  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  Later worked in Sports coach at [employer] Exposures: Reports exposure to asbestos while in [employer].  No mold, hot tub, Jacuzzi. Smoking history: 20-pack-year smoking history.  Quit in 1968 Travel history: Lived in [place], [place], [place].  Recent travel to [place]. Relevant family history: No significant family history of lung issues.  Interim history: States that she is doing well with no issues Has an active lifestyle.  Plays golf 3 times a week  He is in the process of filing a claim at the [place] for asbestos exposure.  Outpatient Encounter Medications as of 06/28/2022  Medication Sig   Ascorbic Acid (VITAMIN C) 1000 MG tablet Take 1,000 mg by mouth daily.    atorvastatin (LIPITOR) 20 MG tablet Take 1 tablet (20 mg total) by mouth daily.   Cholecalciferol (VITAMIN D3) 50 MCG (2000 UT) capsule Take 2,000 Units by mouth daily.    furosemide (LASIX) 20 MG tablet TAKE 1 TABLET DAILY   glucose blood (FREESTYLE LITE) test strip USE TO CHECK BLOOD SUGAR, NOT MORE THAN TWICE A DAY.   Lancets (FREESTYLE) lancets USE LANCET AS DIRECTED TO CHECK BLOOD SUGARS 3 TO 4 TIMES DAILY   metFORMIN (GLUCOPHAGE) 1000 MG tablet TAKE 1 TABLET TWICE A DAY WITH MEALS   multivitamin (THERAGRAN) per tablet Take 1 tablet by mouth daily.   Omega-3 Fatty Acids (FISH OIL) 1000 MG  CAPS Take 1,000 mg by mouth daily.   pioglitazone (ACTOS) 30 MG tablet Take 1 tablet (30 mg total) by mouth daily.   sitaGLIPtin (JANUVIA) 100 MG tablet Take 1 tablet (100 mg total) by mouth daily.   tamsulosin (FLOMAX) 0.4 MG CAPS capsule TAKE 1 CAPSULE DAILY AFTER SUPPER   No facility-administered encounter medications on file as of 06/28/2022.   Physical Exam: Blood pressure (!) 140/62, pulse 76, temperature 97.7 F (36.5 C), temperature source Oral, height 6' (1.829 m), weight 211 lb (95.7 kg), SpO2 99 %. Gen:      No acute distress HEENT:  EOMI, sclera anicteric Neck:     No masses; no thyromegaly Lungs:    Clear to auscultation bilaterally; normal respiratory effort CV:         Regular rate and rhythm; no murmurs Abd:      + bowel sounds; soft, non-tender; no palpable masses, no distension Ext:    No edema; adequate peripheral perfusion Skin:      Warm and dry; no rash Neuro: alert and oriented x 3 Psych: normal mood and affect   Data Reviewed: Imaging: CT chest 07/10/2009- dependent bibasilar atelectasis.  High-resolution CT 10/20/17- minimal septal thickening, subpleural reticulation at the bases.  Mild air trapping.  Tiny calcified granulomas, small pleural plaques Hepatic steatosis, coronary atherosclerosis.  High-resolution CT 12/01/2021-mild subpleural reticulation and traction bronchiectasis.  Indeterminate for UIP.  Stable compared to 2019. I reviewed the images personally.  PFTs  11/17/2017 FVC 4.21 [92%), FEV1 3.43 [104%), F/F 81, TLC 91%, DLCO 75%, DLCO/VA 90% Minimal diffusion defect  12/16/2020 FVC 4.41 [98%], FEV1 3.68 [116%], F/F 83, TLC 7.56 [98%], DLCO 36.15 [136%] Increased diffusion capacity.  Improvement in lung volumes and diffusion capacity compared to 2019  Assessment:  Asbestosis. Review of CT scan shows minimal scarring at the bases and minimal diffusion defect consistent with asbestosis.  His condition is more likely than not the result of his  exposure during [employer] service  He does have enlarged PA but no symptoms of pulmonary hypertension or cor pulmonale PFTs in 2022 actually show an improvement in lung capacity and diffusion He is asymptomatic and maintaining an active lifestyle and we will continue monitoring him  Follow-up CT and PFTs scheduled for August of this year.  Health maintenance 03/02/2017-influenza 10/30/2013-Prevnar 08/05/2016-Pneumovax  Plan/Recommendations: -Follow-up high-res CT and PFTs.  Marshell Garfinkel MD [facility] 06/28/2022, 9:39 AM  CC: Colon Branch, MD

## 2022-07-01 ENCOUNTER — Ambulatory Visit (INDEPENDENT_AMBULATORY_CARE_PROVIDER_SITE_OTHER): Payer: Medicare Other

## 2022-07-01 DIAGNOSIS — I442 Atrioventricular block, complete: Secondary | ICD-10-CM | POA: Diagnosis not present

## 2022-07-01 LAB — CUP PACEART REMOTE DEVICE CHECK
Battery Remaining Longevity: 109 mo
Battery Remaining Percentage: 84 %
Battery Voltage: 3.01 V
Brady Statistic AP VP Percent: 1 %
Brady Statistic AP VS Percent: 1 %
Brady Statistic AS VP Percent: 1 %
Brady Statistic AS VS Percent: 99 %
Brady Statistic RA Percent Paced: 1 %
Brady Statistic RV Percent Paced: 1 %
Date Time Interrogation Session: 20240208020015
Implantable Lead Connection Status: 753985
Implantable Lead Connection Status: 753985
Implantable Lead Implant Date: 20211111
Implantable Lead Implant Date: 20211111
Implantable Lead Location: 753859
Implantable Lead Location: 753860
Implantable Pulse Generator Implant Date: 20211111
Lead Channel Impedance Value: 450 Ohm
Lead Channel Impedance Value: 460 Ohm
Lead Channel Pacing Threshold Amplitude: 0.5 V
Lead Channel Pacing Threshold Amplitude: 1 V
Lead Channel Pacing Threshold Pulse Width: 0.4 ms
Lead Channel Pacing Threshold Pulse Width: 0.4 ms
Lead Channel Sensing Intrinsic Amplitude: 1.5 mV
Lead Channel Sensing Intrinsic Amplitude: 8.1 mV
Lead Channel Setting Pacing Amplitude: 2 V
Lead Channel Setting Pacing Amplitude: 2.5 V
Lead Channel Setting Pacing Pulse Width: 0.4 ms
Lead Channel Setting Sensing Sensitivity: 2 mV
Pulse Gen Model: 2272
Pulse Gen Serial Number: 3859811

## 2022-07-13 DIAGNOSIS — Z85828 Personal history of other malignant neoplasm of skin: Secondary | ICD-10-CM | POA: Diagnosis not present

## 2022-07-13 DIAGNOSIS — L570 Actinic keratosis: Secondary | ICD-10-CM | POA: Diagnosis not present

## 2022-07-13 DIAGNOSIS — L821 Other seborrheic keratosis: Secondary | ICD-10-CM | POA: Diagnosis not present

## 2022-07-13 DIAGNOSIS — B078 Other viral warts: Secondary | ICD-10-CM | POA: Diagnosis not present

## 2022-07-13 DIAGNOSIS — D225 Melanocytic nevi of trunk: Secondary | ICD-10-CM | POA: Diagnosis not present

## 2022-07-22 NOTE — Progress Notes (Signed)
Remote pacemaker transmission.   

## 2022-08-12 DIAGNOSIS — L603 Nail dystrophy: Secondary | ICD-10-CM | POA: Diagnosis not present

## 2022-08-12 DIAGNOSIS — E1151 Type 2 diabetes mellitus with diabetic peripheral angiopathy without gangrene: Secondary | ICD-10-CM | POA: Diagnosis not present

## 2022-08-12 DIAGNOSIS — I739 Peripheral vascular disease, unspecified: Secondary | ICD-10-CM | POA: Diagnosis not present

## 2022-08-12 DIAGNOSIS — L84 Corns and callosities: Secondary | ICD-10-CM | POA: Diagnosis not present

## 2022-08-12 DIAGNOSIS — B351 Tinea unguium: Secondary | ICD-10-CM | POA: Diagnosis not present

## 2022-09-23 DIAGNOSIS — M2022 Hallux rigidus, left foot: Secondary | ICD-10-CM | POA: Diagnosis not present

## 2022-09-23 DIAGNOSIS — S9031XA Contusion of right foot, initial encounter: Secondary | ICD-10-CM | POA: Diagnosis not present

## 2022-09-23 DIAGNOSIS — E1142 Type 2 diabetes mellitus with diabetic polyneuropathy: Secondary | ICD-10-CM | POA: Diagnosis not present

## 2022-09-23 DIAGNOSIS — M2021 Hallux rigidus, right foot: Secondary | ICD-10-CM | POA: Diagnosis not present

## 2022-09-30 ENCOUNTER — Ambulatory Visit (INDEPENDENT_AMBULATORY_CARE_PROVIDER_SITE_OTHER): Payer: Medicare Other

## 2022-09-30 DIAGNOSIS — I442 Atrioventricular block, complete: Secondary | ICD-10-CM | POA: Diagnosis not present

## 2022-09-30 LAB — CUP PACEART REMOTE DEVICE CHECK
Battery Remaining Longevity: 107 mo
Battery Remaining Percentage: 82 %
Battery Voltage: 3.01 V
Brady Statistic AP VP Percent: 1 %
Brady Statistic AP VS Percent: 1 %
Brady Statistic AS VP Percent: 1 %
Brady Statistic AS VS Percent: 99 %
Brady Statistic RA Percent Paced: 1 %
Brady Statistic RV Percent Paced: 1 %
Date Time Interrogation Session: 20240509020030
Implantable Lead Connection Status: 753985
Implantable Lead Connection Status: 753985
Implantable Lead Implant Date: 20211111
Implantable Lead Implant Date: 20211111
Implantable Lead Location: 753859
Implantable Lead Location: 753860
Implantable Pulse Generator Implant Date: 20211111
Lead Channel Impedance Value: 460 Ohm
Lead Channel Impedance Value: 480 Ohm
Lead Channel Pacing Threshold Amplitude: 0.5 V
Lead Channel Pacing Threshold Amplitude: 1 V
Lead Channel Pacing Threshold Pulse Width: 0.4 ms
Lead Channel Pacing Threshold Pulse Width: 0.4 ms
Lead Channel Sensing Intrinsic Amplitude: 0.5 mV
Lead Channel Sensing Intrinsic Amplitude: 11.3 mV
Lead Channel Setting Pacing Amplitude: 2 V
Lead Channel Setting Pacing Amplitude: 2.5 V
Lead Channel Setting Pacing Pulse Width: 0.4 ms
Lead Channel Setting Sensing Sensitivity: 2 mV
Pulse Gen Model: 2272
Pulse Gen Serial Number: 3859811

## 2022-10-05 DIAGNOSIS — E119 Type 2 diabetes mellitus without complications: Secondary | ICD-10-CM | POA: Diagnosis not present

## 2022-10-05 DIAGNOSIS — H401231 Low-tension glaucoma, bilateral, mild stage: Secondary | ICD-10-CM | POA: Diagnosis not present

## 2022-10-05 DIAGNOSIS — H43813 Vitreous degeneration, bilateral: Secondary | ICD-10-CM | POA: Diagnosis not present

## 2022-10-05 DIAGNOSIS — H26493 Other secondary cataract, bilateral: Secondary | ICD-10-CM | POA: Diagnosis not present

## 2022-10-05 LAB — HM DIABETES EYE EXAM

## 2022-10-06 ENCOUNTER — Encounter: Payer: Self-pay | Admitting: Internal Medicine

## 2022-10-14 DIAGNOSIS — E1151 Type 2 diabetes mellitus with diabetic peripheral angiopathy without gangrene: Secondary | ICD-10-CM | POA: Diagnosis not present

## 2022-10-14 DIAGNOSIS — L84 Corns and callosities: Secondary | ICD-10-CM | POA: Diagnosis not present

## 2022-10-14 DIAGNOSIS — L603 Nail dystrophy: Secondary | ICD-10-CM | POA: Diagnosis not present

## 2022-10-14 DIAGNOSIS — B351 Tinea unguium: Secondary | ICD-10-CM | POA: Diagnosis not present

## 2022-10-14 DIAGNOSIS — I739 Peripheral vascular disease, unspecified: Secondary | ICD-10-CM | POA: Diagnosis not present

## 2022-10-17 ENCOUNTER — Other Ambulatory Visit: Payer: Self-pay | Admitting: Internal Medicine

## 2022-10-19 NOTE — Progress Notes (Signed)
Remote pacemaker transmission.   

## 2022-10-21 ENCOUNTER — Encounter (HOSPITAL_COMMUNITY): Payer: Self-pay

## 2022-10-21 ENCOUNTER — Emergency Department (HOSPITAL_COMMUNITY)
Admission: EM | Admit: 2022-10-21 | Discharge: 2022-10-21 | Disposition: A | Discharge status: Home / Self Care | Payer: Medicare Other | Attending: Emergency Medicine | Admitting: Emergency Medicine

## 2022-10-21 ENCOUNTER — Emergency Department (HOSPITAL_COMMUNITY)
Admission: EM | Admit: 2022-10-21 | Discharge: 2022-10-22 | Disposition: A | Discharge status: Home / Self Care | Payer: Medicare Other | Source: Home / Self Care | Attending: Emergency Medicine | Admitting: Emergency Medicine

## 2022-10-21 ENCOUNTER — Encounter: Payer: Self-pay | Admitting: Internal Medicine

## 2022-10-21 ENCOUNTER — Encounter (HOSPITAL_COMMUNITY): Payer: Self-pay | Admitting: *Deleted

## 2022-10-21 ENCOUNTER — Ambulatory Visit
Admission: EM | Admit: 2022-10-21 | Discharge: 2022-10-21 | Disposition: A | Discharge status: Home / Self Care | Payer: Medicare Other | Attending: Family Medicine | Admitting: Family Medicine

## 2022-10-21 ENCOUNTER — Encounter: Payer: Self-pay | Admitting: Emergency Medicine

## 2022-10-21 ENCOUNTER — Other Ambulatory Visit: Payer: Self-pay

## 2022-10-21 DIAGNOSIS — Z7984 Long term (current) use of oral hypoglycemic drugs: Secondary | ICD-10-CM | POA: Diagnosis not present

## 2022-10-21 DIAGNOSIS — E785 Hyperlipidemia, unspecified: Secondary | ICD-10-CM | POA: Diagnosis not present

## 2022-10-21 DIAGNOSIS — Z79899 Other long term (current) drug therapy: Secondary | ICD-10-CM | POA: Diagnosis not present

## 2022-10-21 DIAGNOSIS — R310 Gross hematuria: Secondary | ICD-10-CM

## 2022-10-21 DIAGNOSIS — R339 Retention of urine, unspecified: Secondary | ICD-10-CM | POA: Diagnosis not present

## 2022-10-21 DIAGNOSIS — I10 Essential (primary) hypertension: Secondary | ICD-10-CM | POA: Insufficient documentation

## 2022-10-21 DIAGNOSIS — E119 Type 2 diabetes mellitus without complications: Secondary | ICD-10-CM | POA: Insufficient documentation

## 2022-10-21 DIAGNOSIS — N401 Enlarged prostate with lower urinary tract symptoms: Secondary | ICD-10-CM | POA: Insufficient documentation

## 2022-10-21 DIAGNOSIS — Z85828 Personal history of other malignant neoplasm of skin: Secondary | ICD-10-CM | POA: Diagnosis not present

## 2022-10-21 LAB — URINALYSIS, ROUTINE W REFLEX MICROSCOPIC
Bilirubin Urine: NEGATIVE
Glucose, UA: NEGATIVE mg/dL
Hgb urine dipstick: NEGATIVE
Ketones, ur: NEGATIVE mg/dL
Leukocytes,Ua: NEGATIVE
Nitrite: NEGATIVE
Protein, ur: NEGATIVE mg/dL
Specific Gravity, Urine: 1.006 (ref 1.005–1.030)
pH: 5 (ref 5.0–8.0)

## 2022-10-21 LAB — POCT URINALYSIS DIP (MANUAL ENTRY)
Bilirubin, UA: NEGATIVE
Blood, UA: NEGATIVE
Glucose, UA: NEGATIVE mg/dL
Ketones, POC UA: NEGATIVE mg/dL
Leukocytes, UA: NEGATIVE
Nitrite, UA: NEGATIVE
Protein Ur, POC: NEGATIVE mg/dL
Spec Grav, UA: 1.02 (ref 1.010–1.025)
Urobilinogen, UA: 0.2 E.U./dL
pH, UA: 5.5 (ref 5.0–8.0)

## 2022-10-21 LAB — BASIC METABOLIC PANEL
Anion gap: 9 (ref 5–15)
BUN: 18 mg/dL (ref 8–23)
CO2: 27 mmol/L (ref 22–32)
Calcium: 9.6 mg/dL (ref 8.9–10.3)
Chloride: 101 mmol/L (ref 98–111)
Creatinine, Ser: 1.01 mg/dL (ref 0.61–1.24)
GFR, Estimated: >60 mL/min (ref >60)
Glucose, Bld: 106 mg/dL — ABNORMAL HIGH (ref 70–99)
Potassium: 3.7 mmol/L (ref 3.5–5.1)
Sodium: 137 mmol/L (ref 135–145)

## 2022-10-21 LAB — CBC WITH DIFFERENTIAL/PLATELET
Abs Immature Granulocytes: 0.03 10*3/uL (ref 0.00–0.07)
Basophils Absolute: 0 10*3/uL (ref 0.0–0.1)
Basophils Relative: 0 %
Eosinophils Absolute: 0.2 10*3/uL (ref 0.0–0.5)
Eosinophils Relative: 2 %
HCT: 36.3 % — ABNORMAL LOW (ref 39.0–52.0)
Hemoglobin: 12.1 g/dL — ABNORMAL LOW (ref 13.0–17.0)
Immature Granulocytes: 0 %
Lymphocytes Relative: 18 %
Lymphs Abs: 1.3 10*3/uL (ref 0.7–4.0)
MCH: 31.7 pg (ref 26.0–34.0)
MCHC: 33.3 g/dL (ref 30.0–36.0)
MCV: 95 fL (ref 80.0–100.0)
Monocytes Absolute: 0.6 10*3/uL (ref 0.1–1.0)
Monocytes Relative: 9 %
Neutro Abs: 4.9 10*3/uL (ref 1.7–7.7)
Neutrophils Relative %: 71 %
Platelets: 297 10*3/uL (ref 150–400)
RBC: 3.82 MIL/uL — ABNORMAL LOW (ref 4.22–5.81)
RDW: 13.2 % (ref 11.5–15.5)
WBC: 6.9 10*3/uL (ref 4.0–10.5)
nRBC: 0 % (ref 0.0–0.2)

## 2022-10-21 NOTE — ED Notes (Signed)
Patient is being discharged from the Urgent Care and sent to the Emergency Department via Private Vehicle . Per Dr. Marlinda Mike, patient is in need of higher level of care due to urinary retention without the proper evaluation equipment at this facility. Patient is aware and verbalizes understanding of plan of care.  Vitals:   10/21/22 1250  BP: (!) 159/85  Pulse: 72  Resp: 16  Temp: 98.1 F (36.7 C)  SpO2: 96%

## 2022-10-21 NOTE — ED Notes (Signed)
 detected with bladder scan.

## 2022-10-21 NOTE — Discharge Instructions (Signed)
The urinalysis is normal.  We do not have urinary catheters in the urgent care.  Please proceed to the emergency room for further evaluation.

## 2022-10-21 NOTE — ED Triage Notes (Signed)
Pt presents to ED for evaluation of hematuria that has developed in his leg bag around 1700 today.

## 2022-10-21 NOTE — ED Provider Notes (Signed)
EMERGENCY DEPARTMENT AT [facility] Provider Note   CSN: [id number] Arrival date & time: 10/21/22  1356     History  Chief Complaint  Patient presents with   Urinary Retention    Chase Martinez is a 82 y.o. male with HTN, HLD, BPH, diabetes presents with acute urinary retention.   Pt has been having difficulty emptying his bladder today.  He states that he has been having difficulty getting his urine stream started and only voids small amounts.  Pt is on flomax and lasix, however he was traveling and his wife was in the hospital recently and he didn't take his lasix for 2 days because he didn't want to be peeing constantly.  No pain or burning with urination, pt feels that his bladder is full and has the consistent urge to urinate. Pt was seen at [facility] and his urinalysis was WNL, he was sent here for foley cath placement.  He has never had this problem before.  He does not normally follow with urology.  He does have a history of BPH and his Flomax is prescribed by his primary care physician.  HPI     Home Medications Prior to Admission medications   Medication Sig Start Date End Date Taking? Authorizing Provider  Ascorbic Acid (VITAMIN C) 1000 MG tablet Take 1,000 mg by mouth daily.  08/14/18   [provider]  atorvastatin (LIPITOR) 20 MG tablet Take 1 tablet (20 mg total) by mouth daily. 06/02/22   Wanda Plump, MD  Cholecalciferol (VITAMIN D3) 50 MCG (2000 UT) capsule Take 2,000 Units by mouth daily.  08/14/18   [provider]  furosemide (LASIX) 20 MG tablet Take 1 tablet (20 mg total) by mouth daily. 10/19/22   Wanda Plump, MD  glucose blood (FREESTYLE LITE) test strip USE TO CHECK BLOOD SUGAR, NOT MORE THAN TWICE A DAY. 04/06/22   Wanda Plump, MD  Lancets (FREESTYLE) lancets USE LANCET AS DIRECTED TO CHECK BLOOD SUGARS 3 TO 4 TIMES DAILY 12/29/21   Wanda Plump, MD  metFORMIN (GLUCOPHAGE) 1000 MG tablet TAKE 1 TABLET TWICE A DAY WITH MEALS  01/17/22   Worthy Rancher B, FNP  multivitamin [facility]) per tablet Take 1 tablet by mouth daily.    [provider]  Omega-3 Fatty Acids (FISH OIL) 1000 MG CAPS Take 1,000 mg by mouth daily.    [provider]  pioglitazone (ACTOS) 30 MG tablet Take 1 tablet (30 mg total) by mouth daily. 06/02/22   Wanda Plump, MD  sitaGLIPtin (JANUVIA) 100 MG tablet Take 1 tablet (100 mg total) by mouth daily. 05/28/22   Wanda Plump, MD  tamsulosin (FLOMAX) 0.4 MG CAPS capsule TAKE 1 CAPSULE DAILY AFTER SUPPER 04/17/22   Wanda Plump, MD      Allergies    Patient has no known allergies.    Review of Systems   Review of Systems Review of systems Negative for f/c, flank pain.  A 10 point review of systems was performed and is negative unless otherwise reported in HPI.  Physical Exam Updated Vital Signs BP (!) 153/80 (BP Location: Left Arm)   Pulse 71   Temp 97.7 F (36.5 C) (Oral)   Resp 18   SpO2 100%  Physical Exam General: Normal appearing male, lying in bed.  HEENT: Sclera anicteric, MMM, trachea midline.  Cardiology: RRR, no murmurs/rubs/gallops. Resp: Normal respiratory rate and effort. Abd: Soft, non-tender, non-distended. No rebound tenderness or guarding.  GU: Normal-appearing circumcised genitalia without any swelling, erythema, urethral discharge or bleeding.  Normal-appearing bilateral scrotum with no erythema, swelling, tenderness with patient of bilateral testicles or epididymides. MSK: No peripheral edema or signs of trauma. Extremities without deformity or TTP. No cyanosis or clubbing. Skin: warm, dry. Back: No CVA tenderness Neuro: A&Ox4, CNs II-XII grossly intact. MAEs. Sensation grossly intact.  Psych: Normal mood and affect.   ED Results / Procedures / Treatments   Labs (all labs ordered are listed, but only abnormal results are displayed) Labs Reviewed  URINALYSIS, ROUTINE W REFLEX MICROSCOPIC - Abnormal; Notable for the following components:      Result  Value   Color, Urine STRAW (*)    All other components within normal limits  CBC WITH DIFFERENTIAL/PLATELET - Abnormal; Notable for the following components:   RBC 3.82 (*)    Hemoglobin 12.1 (*)    HCT 36.3 (*)    All other components within normal limits  BASIC METABOLIC PANEL - Abnormal; Notable for the following components:   Glucose, Bld 106 (*)    All other components within normal limits    EKG None  Radiology No results found.  Procedures Procedures    Medications Ordered in ED Medications - No data to display  ED Course/ Medical Decision Making/ A&P                          Medical Decision Making Amount and/or Complexity of Data Reviewed Labs: ordered. Decision-making details documented in ED Course.    This patient presents to the ED for concern of acute urinary retention, this involves an extensive number of treatment options, and is a complaint that carries with it a high risk of complications and morbidity.  Pt is overall HDS and well-appearing, afebrile  MDM:    Ddx for urinary retention includes but is not limited to:  Obstructive causes: BPH which patient has a history of. He didn't take his flomax or lasix for a couple of days, which is potentially the cause of his symptoms. No abd or flank pain to indicate stone (nephrolithiasis, ureterolithiasis). Also consider infection (UTI, Pyelonephritis, Prostatitis) though he's had no symptoms of infection and reportedly had a clean UA this AM at urgent care. No hematuria to indicate blood clot. No trauma. No e/o phimosis, paraphimosis on exam. Will have patient attempt to urinate and obtain PVR. If elevated  >300 will place foley. Will also check for renal injury or electrolyte abnormalities caused by urinary retention.   Clinical Course as of 10/21/22 1656  Thu Oct 21, 2022  1553 Basic metabolic panel(!) Unremarkable [HN]  1553 CBC with Differential(!) Unremarkable [HN]  1638 Urinalysis, Routine w reflex  microscopic -Urine, Clean Catch(!) No infection [HN]  1639 Pt voided >200 cc and repeat PVR again shows >460 cc. Will place foley for acute urinary retention. D/w patient that he will need to f/u with urology and he reports understanding. [HN]    Clinical Course User Index [HN] Loetta Rough, MD    Labs: I Ordered, and personally interpreted labs.  The pertinent results include:  those listed above  Additional history obtained from chart review.    Reevaluation: After the interventions noted above, I reevaluated the patient and found that they have :improved  Social Determinants of Health: Patient lives independently   Disposition: Foley catheter was placed.  Given patient instructions and information in order to follow-up with Dr. Benancio Deeds in clinic at [facility] in the next 1 to 2 weeks.  He reports understanding.  Discharged with catheter care at home and specific discharge instructions and return precautions.  All questions answered to patient's satisfaction.  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   I have reviewed the patients home medicines and have made adjustments as needed  Problem List / ED Course: Problem List Items Addressed This Visit   None Visit Diagnoses     Urinary retention    -  Primary                   This note was created using dictation software, which may contain spelling or grammatical errors.    Loetta Rough, MD 10/21/22 [phone]

## 2022-10-21 NOTE — ED Triage Notes (Addendum)
Pt has been having difficulty emptying his bladder.  He states that he has been having difficulty getting his urine stream started and only voids small amounts.  Pt is on flomax and lasix.  No pain or burning with urination, pt feels that his bladder is full. Pt was seen at [facility] and his urinalysis was WNL, he was sent here for foley cath placement.

## 2022-10-21 NOTE — ED Provider Notes (Signed)
[facility]    CSN: [id number] Arrival date & time: 10/21/22  1241      History   Chief Complaint Chief Complaint  Patient presents with   Dysuria    HPI Chase Martinez is a 82 y.o. male.    Here for trouble urinating. Yesterday he began having trouble with urinary hesitancy, only able to get out a small amount of urine at a time. No dysuria and no hematuria  No f/c and no n/v/d. Bms nl.  He usually takes lasix, and did not yesterday. He states he had a better urinary flow this AM, but then he took his lasix this AM, and now is experiencing some pressure in his bladder.  He already takes flomax 0.4 mg daily. 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 Authorizing Provider  Ascorbic Acid (VITAMIN C) 1000 MG tablet Take 1,000 mg by mouth daily.  08/14/18   [provider]  atorvastatin (LIPITOR) 20 MG tablet Take 1 tablet (20 mg total) by mouth daily. 06/02/22   Wanda Plump, MD  Cholecalciferol (VITAMIN D3) 50 MCG (2000 UT) capsule Take 2,000 Units by mouth daily.  08/14/18   [provider]  furosemide (LASIX) 20 MG tablet Take 1 tablet (20 mg total) by mouth daily. 10/19/22   Wanda Plump, MD  glucose blood (FREESTYLE LITE) test strip USE TO CHECK BLOOD SUGAR, NOT MORE THAN TWICE A DAY. 04/06/22   Wanda Plump, MD  Lancets (FREESTYLE) lancets USE LANCET AS DIRECTED TO CHECK BLOOD SUGARS 3 TO 4 TIMES DAILY 12/29/21   Wanda Plump, MD  metFORMIN (GLUCOPHAGE) 1000 MG tablet TAKE 1 TABLET TWICE A DAY WITH MEALS 01/17/22   Worthy Rancher B, FNP  multivitamin [facility]) per tablet Take 1 tablet by mouth daily.    [provider]  Omega-3 Fatty Acids (FISH OIL) 1000 MG CAPS Take 1,000 mg by mouth daily.    [provider]  pioglitazone (ACTOS) 30 MG tablet Take 1 tablet (30 mg total) by mouth daily. 06/02/22   Paz,  Nolon Rod, MD  sitaGLIPtin (JANUVIA) 100 MG tablet Take 1 tablet (100 mg total) by mouth daily. 05/28/22   Wanda Plump, MD  tamsulosin (FLOMAX) 0.4 MG CAPS capsule TAKE 1 CAPSULE DAILY AFTER SUPPER 04/17/22   Wanda Plump, MD    Family History Family History  Problem Relation Age of Onset   Hypertension Mother    Glaucoma Mother    Dementia Father    Parkinsonism Sister    Thyroid cancer Son    Coronary artery disease Neg Hx    Diabetes Neg Hx    Stroke Neg Hx    Prostate cancer Neg Hx    Colon cancer Neg Hx    Esophageal cancer Neg Hx    Stomach cancer Neg Hx     Social History Social History   Tobacco Use   Smoking status: Former    Packs/day: 1    Types: Cigarettes, Cigars    Quit date: 05/24/1966    Years since quitting: 56.4    Smokeless tobacco: Former    Types: Snuff    Quit date: 05/24/1988  Vaping Use   Vaping Use: Never used  Substance Use Topics   Alcohol use: Yes    Alcohol/week: 7.0 - 14.0 standard drinks of alcohol    Types: 7 - 14 Glasses of wine per week    Comment: occasional beer and wine   Drug use: No     Allergies   Patient has no known allergies.   Review of Systems Review of Systems  Genitourinary:  Negative for dysuria.     Physical Exam Triage Vital Signs ED Triage Vitals  Enc Vitals Group     BP 10/21/22 1250 (!) 159/85     Pulse Rate 10/21/22 1250 72     Resp 10/21/22 1250 16     Temp 10/21/22 1250 98.1 F (36.7 C)     Temp Source 10/21/22 1250 Oral     SpO2 10/21/22 1250 96 %     Weight --      Height --      Head Circumference --      Peak Flow --      Pain Score 10/21/22 1249 1     Pain Loc --      Pain Edu? --      Excl. in GC? --    No data found.  Updated Vital Signs BP (!) 159/85 (BP Location: Left Arm)   Pulse 72   Temp 98.1 F (36.7 C) (Oral)   Resp 16   SpO2 96%   Visual Acuity Right Eye Distance:   Left Eye Distance:   Bilateral Distance:    Right Eye Near:   Left Eye Near:    Bilateral Near:     Physical Exam Vitals reviewed.  Constitutional:      General: He is not in acute distress.    Appearance: He is not ill-appearing, toxic-appearing or diaphoretic.  HENT:     Mouth/Throat:     Mouth: Mucous membranes are moist.  Eyes:     Extraocular Movements: Extraocular movements intact.     Conjunctiva/sclera: Conjunctivae normal.     Pupils: Pupils are equal, round, and reactive to light.  Cardiovascular:     Rate and Rhythm: Normal rate and regular rhythm.     Heart sounds: No murmur heard. Pulmonary:     Effort: Pulmonary effort is normal.     Breath sounds: Normal breath sounds.  Abdominal:  General: There is no distension.     Palpations: Abdomen is soft. There is no mass.     Tenderness: There is no abdominal tenderness.  There is no guarding.     Comments: At present I cannot palpate his bladder  Musculoskeletal:     Cervical back: Neck supple.  Lymphadenopathy:     Cervical: No cervical adenopathy.  Skin:    Coloration: Skin is not jaundiced or pale.  Neurological:     General: No focal deficit present.     Mental Status: He is alert and oriented to person, place, and time.  Psychiatric:        Behavior: Behavior normal.      UC Treatments / Results  Labs (all labs ordered are listed, but only abnormal results are displayed) Labs Reviewed  POCT URINALYSIS DIP (MANUAL ENTRY)    EKG   Radiology No results found.  Procedures Procedures (including critical care time)  Medications Ordered in UC Medications - No data to display  Initial Impression / Assessment and Plan / UC Course  I have reviewed the triage vital signs and the nursing notes.  Pertinent labs & imaging results that were available during my care of the patient were reviewed by me and considered in my medical decision making (see chart for details).        His UA is clear. With his incomplete bladder emptying, he is still retaining some urine. I cannot tell that he has a prostatitis causing this problem, with a completely normal UA. I have asked him to proceed to the emergency room for higher level of evaluation and treatment then we can provide here in the urgent care setting.  We do not have urinary catheters at our urgent care sites. Final Clinical Impressions(s) / UC Diagnoses   Final diagnoses:  Urinary retention     Discharge Instructions      The urinalysis is normal.  We do not have urinary catheters in the urgent care.  Please proceed to the emergency room for further evaluation.     ED Prescriptions   None    PDMP not reviewed this encounter.   Zenia Resides, MD 10/21/22 [phone]

## 2022-10-21 NOTE — Discharge Instructions (Signed)
Thank you for coming to [facility] Emergency Department. You were seen for inability to empty bladder. We did an exam, labs, and imaging, and these showed that you are not emptying your bladder completely.  The treatment for this acute urinary retention is Foley catheter placement.  This urinary catheter was placed here in the emergency department and will remain in place when you go home.  Please follow-up with urology, Dr. Benancio Deeds with [facility], within the next 1-2 weeks about this issue. You can call [phone] tomorrow morning to make this appointment.  Do not hesitate to return to the ED or call 911 if you experience: -Worsening symptoms -Abdominal or flank pain -The foley stops draining urine -Abdominal distension -Lightheadedness, passing out -Fevers/chills -Anything else that concerns you

## 2022-10-21 NOTE — ED Notes (Signed)
  Patient urinary catheter irrigated with 100 ml of NS.  Volume used was returned pink tinged with small clots.  Patient tolerated well.  Dr Bebe Shaggy notified.

## 2022-10-21 NOTE — ED Triage Notes (Addendum)
Patient presdents to [facility] for sudden onset of difficulty being able to get his stream started.  Denies pain during his stream.  C/o bladder pressure.  Takes flomax and lasix.  Patient states he did NOT take his lasix yesterday morning, but resumed this morning.  Patient asked for a urine sample, states he was just able to have a larger than normal urination for today and does not want any fluids to help at this time

## 2022-10-22 NOTE — ED Provider Notes (Signed)
[place] EMERGENCY DEPARTMENT AT [facility] Provider Note   CSN: [id number] Arrival date & time: 10/21/22  2257     History  Chief Complaint  Patient presents with   Hematuria    Chase Martinez is a 82 y.o. male.  The history is provided by the patient.   Patient presents with hematuria.  He was seen on May 30 for urinary retention.  He had a catheter placed and was discharged home.  Later in the day he noted blood in his Foley bag.  He is having no pain.  No back or abdominal pain.  No fevers or vomiting. He is not on anticoagulation    Home Medications Prior to Admission medications   Medication Sig Start Date End Date Taking? Authorizing Provider  Ascorbic Acid (VITAMIN C) 1000 MG tablet Take 1,000 mg by mouth daily.  08/14/18   [provider]  atorvastatin (LIPITOR) 20 MG tablet Take 1 tablet (20 mg total) by mouth daily. 06/02/22   Wanda Plump, MD  Cholecalciferol (VITAMIN D3) 50 MCG (2000 UT) capsule Take 2,000 Units by mouth daily.  08/14/18   [provider]  furosemide (LASIX) 20 MG tablet Take 1 tablet (20 mg total) by mouth daily. 10/19/22   Wanda Plump, MD  glucose blood (FREESTYLE LITE) test strip USE TO CHECK BLOOD SUGAR, NOT MORE THAN TWICE A DAY. 04/06/22   Wanda Plump, MD  Lancets (FREESTYLE) lancets USE LANCET AS DIRECTED TO CHECK BLOOD SUGARS 3 TO 4 TIMES DAILY 12/29/21   Wanda Plump, MD  metFORMIN (GLUCOPHAGE) 1000 MG tablet TAKE 1 TABLET TWICE A DAY WITH MEALS 01/17/22   Worthy Rancher B, FNP  multivitamin [facility]) per tablet Take 1 tablet by mouth daily.    [provider]  Omega-3 Fatty Acids (FISH OIL) 1000 MG CAPS Take 1,000 mg by mouth daily.    [provider]  pioglitazone (ACTOS) 30 MG tablet Take 1 tablet (30 mg total) by mouth daily. 06/02/22   Wanda Plump, MD  sitaGLIPtin (JANUVIA) 100 MG tablet Take 1 tablet (100 mg total) by mouth daily. 05/28/22   Wanda Plump, MD  tamsulosin (FLOMAX) 0.4 MG CAPS  capsule TAKE 1 CAPSULE DAILY AFTER SUPPER 04/17/22   Wanda Plump, MD      Allergies    Patient has no known allergies.    Review of Systems   Review of Systems  Constitutional:  Negative for fever.    Physical Exam Updated Vital Signs BP (!) 165/87 (BP Location: Right Arm)   Pulse 90   Temp 98.2 F (36.8 C) (Oral)   Resp 18   Ht 1.829 m (6')   Wt 97.5 kg   SpO2 95%   BMI 29.16 kg/m  Physical Exam CONSTITUTIONAL: Elderly, no acute distress HEAD: Normocephalic/atraumatic EYES: EOMI ENMT: Mucous membranes moist NECK: supple no meningeal signs SPINE/BACK:entire spine nontender CV: S1/S2 noted, no murmurs/rubs/gallops noted LUNGS: Lungs are clear to auscultation bilaterally, no apparent distress ABDOMEN: soft, nontender, no suprapubic tenderness GU: Foley catheter in place NEURO: Pt is awake/alert/appropriate, moves all extremitiesx4.  No facial droop.    ED Results / Procedures / Treatments   Labs (all labs ordered are listed, but only abnormal results are displayed) Labs Reviewed - No data to display  EKG None  Radiology No results found.  Procedures Procedures    Medications Ordered in ED Medications - No data to display  ED Course/ Medical Decision Making/ A&P  Clinical Course as of 10/22/22 0055  Fri Oct 22, 2022  0960 Patient improved, no acute distress.  Urine is flowing without any signs of obstruction.  It has been irrigated without difficulty.  He still has some blood-tinged urine but is in no acute distress.  He is safe for outpatient management [DW]    Clinical Course User Index [DW] Zadie Rhine, MD                             Medical Decision Making          Final Clinical Impression(s) / ED Diagnoses Final diagnoses:  Gross hematuria    Rx / DC Orders ED Discharge Orders     None         Zadie Rhine, MD 10/22/22 [phone]

## 2022-10-26 DIAGNOSIS — N401 Enlarged prostate with lower urinary tract symptoms: Secondary | ICD-10-CM | POA: Diagnosis not present

## 2022-10-26 DIAGNOSIS — R338 Other retention of urine: Secondary | ICD-10-CM | POA: Diagnosis not present

## 2022-10-27 ENCOUNTER — Telehealth: Payer: Self-pay

## 2022-10-27 DIAGNOSIS — N401 Enlarged prostate with lower urinary tract symptoms: Secondary | ICD-10-CM | POA: Diagnosis not present

## 2022-10-27 DIAGNOSIS — N3000 Acute cystitis without hematuria: Secondary | ICD-10-CM | POA: Diagnosis not present

## 2022-10-27 DIAGNOSIS — R3912 Poor urinary stream: Secondary | ICD-10-CM | POA: Diagnosis not present

## 2022-10-27 NOTE — Telephone Encounter (Signed)
Transition Care Management Unsuccessful Follow-up Telephone Call  Date of discharge and from where:  10/22/2022 [facility]  Attempts:  1st Attempt  Reason for unsuccessful TCM follow-up call:  Left voice message  Chase Martinez [place] [facility]  [facility] Population Health Community Resource Care Guide   [email or web address]  ?? [phone]   Website: [email or web address]  [email or web address]      

## 2022-10-28 ENCOUNTER — Telehealth: Payer: Self-pay

## 2022-10-28 ENCOUNTER — Telehealth: Payer: Self-pay | Admitting: Internal Medicine

## 2022-10-28 NOTE — Telephone Encounter (Signed)
Transition Care Management Follow-up Telephone Call Date of discharge and from where: 10/22/2022 [facility] How have you been since you were released from the hospital? Patient stated he is feeling better. Any questions or concerns? No  Items Reviewed: Did the pt receive and understand the discharge instructions provided? Yes  Medications obtained and verified? Yes  Other? No  Any new allergies since your discharge? No  Dietary orders reviewed? Yes Do you have support at home? Yes   Follow up appointments reviewed:  PCP Hospital f/u appt confirmed? No  Scheduled to see  on  @ . Specialist Hospital f/u appt confirmed?  Patient stated he has seen Urologist Cristal Deer Winter at [facility]  Scheduled to see  on  @ . Are transportation arrangements needed? No  If their condition worsens, is the pt aware to call PCP or go to the Emergency Dept.? Yes Was the patient provided with contact information for the PCP's office or ED? Yes Was to pt encouraged to call back with questions or concerns? Yes  Nishawn Rotan [facility]  [facility] Population Health Community Resource Care Guide   [email or web address]  ?? [phone]   Website: [email or web address]  [email or web address]

## 2022-10-28 NOTE — Telephone Encounter (Signed)
Patient called and would like provider to call back as he has a couple questions regarding some medication. Thank you

## 2022-10-29 NOTE — Telephone Encounter (Signed)
See mychart message from Pt.  

## 2022-11-09 DIAGNOSIS — N401 Enlarged prostate with lower urinary tract symptoms: Secondary | ICD-10-CM | POA: Diagnosis not present

## 2022-11-09 DIAGNOSIS — N3000 Acute cystitis without hematuria: Secondary | ICD-10-CM | POA: Diagnosis not present

## 2022-11-09 DIAGNOSIS — R3912 Poor urinary stream: Secondary | ICD-10-CM | POA: Diagnosis not present

## 2022-11-09 DIAGNOSIS — R8271 Bacteriuria: Secondary | ICD-10-CM | POA: Diagnosis not present

## 2022-11-16 ENCOUNTER — Ambulatory Visit (INDEPENDENT_AMBULATORY_CARE_PROVIDER_SITE_OTHER): Payer: Medicare Other | Admitting: *Deleted

## 2022-11-16 ENCOUNTER — Encounter: Payer: Self-pay | Admitting: Internal Medicine

## 2022-11-16 DIAGNOSIS — Z0000 Encounter for general adult medical examination without abnormal findings: Secondary | ICD-10-CM | POA: Diagnosis not present

## 2022-11-16 NOTE — Progress Notes (Signed)
Subjective:   Chase Martinez is a 82 y.o. male who presents for Medicare Annual/Subsequent preventive examination.  Visit Complete: Virtual  I connected with  Terance Ice on 11/16/22 by a audio enabled telemedicine application and verified that I am speaking with the correct person using two identifiers.  Patient Location: Home  Provider Location: Office/Clinic  I discussed the limitations of evaluation and management by telemedicine. The patient expressed understanding and agreed to proceed.  Patient Medicare AWV questionnaire was completed by the patient on 11/10/22; I have confirmed that all information answered by patient is correct and no changes since this date.  Review of Systems     Cardiac Risk Factors include: advanced age (>60men, >63 women);male gender;diabetes mellitus;dyslipidemia;hypertension     Objective:    Today's Vitals   There is no height or weight on file to calculate BMI.     11/16/2022    8:16 AM 10/21/2022    2:17 PM 11/10/2021    8:27 AM 11/04/2020   10:59 AM 04/03/2020    8:46 AM 03/13/2019    3:19 PM 03/09/2018    9:05 AM  Advanced Directives  Does Patient Have a Medical Advance Directive? Yes No Yes Yes Yes Yes Yes  Type of Estate agent of [place];Living will  Living will;Healthcare Power of [employer] Power of [place];Living will Healthcare Power of [place];Living will Healthcare Power of [place];Living will Healthcare Power of [place];Living will  Does patient want to make changes to medical advance directive? No - Patient declined  No - Patient declined   No - Patient declined   Copy of Healthcare Power of Attorney in Chart? Yes - validated most recent copy scanned in chart (See row information)  No - copy requested No - copy requested  Yes - validated most recent copy scanned in chart (See row information) No - copy requested    Current Medications (verified) Outpatient Encounter Medications as of  11/16/2022  Medication Sig   Ascorbic Acid (VITAMIN C) 1000 MG tablet Take 1,000 mg by mouth daily.    atorvastatin (LIPITOR) 20 MG tablet Take 1 tablet (20 mg total) by mouth daily.   Cholecalciferol (VITAMIN D3) 50 MCG (2000 UT) capsule Take 2,000 Units by mouth daily.    furosemide (LASIX) 20 MG tablet Take 1 tablet (20 mg total) by mouth daily.   glucose blood (FREESTYLE LITE) test strip USE TO CHECK BLOOD SUGAR, NOT MORE THAN TWICE A DAY.   Lancets (FREESTYLE) lancets USE LANCET AS DIRECTED TO CHECK BLOOD SUGARS 3 TO 4 TIMES DAILY   metFORMIN (GLUCOPHAGE) 1000 MG tablet TAKE 1 TABLET TWICE A DAY WITH MEALS   multivitamin (THERAGRAN) per tablet Take 1 tablet by mouth daily.   Omega-3 Fatty Acids (FISH OIL) 1000 MG CAPS Take 1,000 mg by mouth daily.   pioglitazone (ACTOS) 30 MG tablet Take 1 tablet (30 mg total) by mouth daily.   sitaGLIPtin (JANUVIA) 100 MG tablet Take 1 tablet (100 mg total) by mouth daily.   tamsulosin (FLOMAX) 0.4 MG CAPS capsule TAKE 1 CAPSULE DAILY AFTER SUPPER   No facility-administered encounter medications on file as of 11/16/2022.    Allergies (verified) Patient has no known allergies.   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   Procedure: PACEMAKER IMPLANT;  Surgeon: Lanier Prude, MD;  Location: [facility];  Service: Cardiovascular;  Laterality: N/A;   PILONIDAL CYST EXCISION     REFRACTIVE SURGERY     SHOULDER  SURGERY     right   TONSILLECTOMY     Family History  Problem Relation Age of Onset   Hypertension Mother    Glaucoma Mother    Dementia Father    Hearing loss Father    Parkinsonism Sister    Thyroid cancer Son    Coronary artery disease Neg Hx    Diabetes Neg Hx    Stroke Neg Hx    Prostate cancer Neg Hx    Colon cancer Neg Hx    Esophageal cancer Neg Hx    Stomach cancer Neg Hx    Social History   Socioeconomic History   Marital status: Married    Spouse name: Not on file   Number of children: 2   Years of education: Not on file   Highest education level: Not on file  Occupational History   Occupation: Retired Cabin crew Academic librarian)  Tobacco Use   Smoking status: Former    Packs/day: 0.50    Years: 10.00    Additional pack years: 0.00    Total pack years: 5.00    Types: Cigarettes, Cigars    Quit date: 05/24/1966    Years since quitting: 56.5   Smokeless tobacco: Former    Types: Snuff    Quit date: 05/24/1988  Vaping Use   Vaping Use: Never used  Substance and Sexual Activity   Alcohol use: Yes    Alcohol/week: 8.0 standard drinks of alcohol    Types: 8 Glasses of wine per week    Comment: Glass of wine at dinner & minimal social drinking   Drug use: Never   Sexual activity: Not Currently  Other Topics Concern   Not on file  Social History Narrative   Household-- pt and wife    Son lives near by, daughter near [place]   Social Determinants of Health   Financial Resource Strain: Low Risk  (11/10/2022)   Overall Financial Resource Strain (CARDIA)    Difficulty of Paying Living Expenses: Not hard at all  Food Insecurity: No Food Insecurity (11/10/2022)   Hunger Vital Sign    Worried About Running Out of Food in the Last Year: Never true    Ran Out of Food in the Last Year: Never true  Transportation Needs: No Transportation Needs (11/10/2022)   PRAPARE - Administrator, Civil Service (Medical): No    Lack of Transportation (Non-Medical): No   Physical Activity: Sufficiently Active (11/10/2022)   Exercise Vital Sign    Days of Exercise per Week: 5 days    Minutes of Exercise per Session: 150+ min  Stress: No Stress Concern Present (11/10/2022)   [employer] of Occupational Health - Occupational Stress Questionnaire    Feeling of Stress : Not at all  Social Connections: Unknown (11/10/2022)   Social Connection and Isolation Panel [NHANES]    Frequency of Communication with Friends and Family:  More than three times a week    Frequency of Social Gatherings with Friends and Family: More than three times a week    Attends Religious Services: Not on file    Active Member of Clubs or Organizations: Yes    Attends Engineer, structural: More than 4 times per year    Marital Status: Married    Tobacco Counseling Counseling given: Not Answered   Clinical Intake:  Pre-visit preparation completed: Yes  Pain : No/denies pain  Nutritional Risks: None Diabetes: Yes CBG done?: No Did pt. bring in CBG monitor from home?: No  How often do you need to have someone help you when you read instructions, pamphlets, or other written materials from your doctor or pharmacy?: 1 - Never  Interpreter Needed?: No  Information entered by :: Donne Anon, CMA   Activities of Daily Living    11/10/2022   11:02 AM  In your present state of health, do you have any difficulty performing the following activities:  Hearing? 0  Vision? 0  Difficulty concentrating or making decisions? 0  Walking or climbing stairs? 0  Dressing or bathing? 0  Doing errands, shopping? 0  Preparing Food and eating ? N  Using the Toilet? N  In the past six months, have you accidently leaked urine? N  Do you have problems with loss of bowel control? N  Managing your Medications? N  Managing your Finances? N  Housekeeping or managing your Housekeeping? N    Patient Care Team: Wanda Plump, MD as PCP - General Quintella Reichert, MD as PCP -  Cardiology (Cardiology) Lanier Prude, MD as PCP - Electrophysiology (Cardiology) Larey Dresser, DPM as Consulting Physician (Podiatry) Janet Berlin, MD as Consulting Physician (Ophthalmology)  Indicate any recent Medical Services you may have received from other than [facility] providers in the past year (date may be approximate).     Assessment:   This is a routine wellness examination for [place].  Hearing/Vision screen No results found.  Dietary issues and exercise activities discussed:     Goals Addressed   None    Depression Screen    11/16/2022    8:24 AM 06/08/2022    7:59 AM 12/03/2021    8:23 AM 11/10/2021    8:28 AM 06/04/2021    8:21 AM 12/04/2020    8:18 AM 11/04/2020   11:03 AM  PHQ 2/9 Scores  PHQ - 2 Score 0 0 0 0 0 0 0    Fall Risk    11/10/2022   11:02 AM 06/08/2022    7:59 AM 12/03/2021    8:23 AM 11/10/2021    8:28 AM 06/04/2021    8:20 AM  Fall Risk   Falls in the past year? 0 0 0 0 0  Number falls in past yr: 0 0 0 0 0  Injury with Fall? 0 0 0 0 0  Risk for fall due to : No Fall Risks   No Fall Risks   Follow up Falls evaluation completed Falls evaluation completed Falls evaluation completed Falls evaluation completed Falls evaluation completed    MEDICARE RISK AT HOME:   TIMED UP AND GO:  Was the test performed?  No    Cognitive Function:        11/16/2022    8:26 AM 11/10/2021    8:35 AM  6CIT Screen  What Year? 0 points 0 points  What month? 0 points 0 points  What time? 0 points 0 points  Count back from 20 0 points 0 points  Months in reverse 0 points 0 points  Repeat phrase 0 points 0 points  Total Score 0 points 0 points    Immunizations Immunization History  Administered Date(s) Administered   COVID-19, mRNA, vaccine(Comirnaty)12 years and older 02/23/2022   DTaP 06/21/2011   Fluad Quad(high Dose 65+) 02/28/2020   H1N1 06/11/2008   Hepatitis A 12/23/2015   Influenza Split 02/22/2012, 02/23/2022   Influenza Whole  04/05/2007, 02/13/2008, 02/18/2009, 01/20/2010   Influenza, High Dose Seasonal PF 04/04/2013, 03/02/2017, 03/09/2018, 01/06/2019   Influenza,inj,Quad PF,6+ Mos 03/07/2014   Influenza-Unspecified 03/06/2015, 02/20/2016, 02/03/2021   PFIZER(Purple Top)SARS-COV-2 Vaccination 06/05/2019, 06/25/2019, 02/18/2020, 08/22/2020   PNEUMOCOCCAL CONJUGATE-20 06/08/2022   Pfizer Covid-19 Vaccine Bivalent Booster 37yrs & up 02/03/2021, 12/03/2021   Pneumococcal Conjugate-13 10/30/2013   Pneumococcal Polysaccharide-23 05/24/2002, 09/28/2007, 08/05/2016   Respiratory Syncytial Virus Vaccine,Recomb Aduvanted(Arexvy) 03/08/2022   Td 05/24/2001, 05/21/2013   Tdap 12/03/2021   Typhoid Inactivated 12/23/2015   Yellow Fever 12/23/2015   Zoster Recombinat (Shingrix) 03/21/2018, 05/25/2018   Zoster, Live 05/24/2002    TDAP status: Up to date  Flu Vaccine status: Up to date  Pneumococcal vaccine status: Up to date  Covid-19 vaccine status: Information provided on how to obtain vaccines.   Qualifies for Shingles Vaccine? Yes   Zostavax completed Yes   Shingrix Completed?: Yes  Screening Tests Health Maintenance  Topic Date Due   Medicare Annual Wellness (AWV)  11/11/2022   COVID-19 Vaccine (8 - 2023-24 season) 12/06/2022 (Originally 04/20/2022)   HEMOGLOBIN A1C  12/07/2022   INFLUENZA VACCINE  12/23/2022   Diabetic kidney evaluation - Urine ACR  06/09/2023   FOOT EXAM  06/09/2023   OPHTHALMOLOGY EXAM  10/05/2023   Diabetic kidney evaluation - eGFR measurement  10/21/2023   DTaP/Tdap/Td (5 - Td or Tdap) 12/04/2031   Pneumonia Vaccine 36+ Years old  Completed   Zoster Vaccines- Shingrix  Completed   HPV VACCINES  Aged Out    Health Maintenance  Health Maintenance Due  Topic Date Due   Medicare Annual Wellness (AWV)  11/11/2022    Colorectal cancer screening: No longer required.   Lung Cancer Screening: (Low Dose CT Chest recommended if Age 82-80 years, 20 pack-year currently smoking OR  have quit w/in 15years.) does not qualify.   Additional Screening:  Hepatitis C Screening: does not qualify;  Vision Screening: Recommended annual ophthalmology exams for early detection of glaucoma and other disorders of the eye. Is the patient up to date with their annual eye exam?  Yes  Who is the provider or what is the name of the office in which the patient attends annual eye exams? [facility] If pt is not established with a provider, would they like to be referred to a provider to establish care? No .   Dental Screening: Recommended annual dental exams for proper oral hygiene  Diabetic Foot Exam: Diabetic Foot Exam: Completed 06/08/22  Community Resource Referral / Chronic Care Management: CRR required this visit?  No   CCM required this visit?  No     Plan:     I have personally reviewed and noted the following in the patient's chart:   Medical and social history Use of alcohol, tobacco or illicit drugs  Current medications and supplements including opioid prescriptions. Patient is not currently taking opioid prescriptions. Functional ability and status Nutritional status Physical activity Advanced directives List of other physicians Hospitalizations, surgeries, and ER visits in previous 12 months Vitals Screenings to  include cognitive, depression, and falls Referrals and appointments  In addition, I have reviewed and discussed with patient certain preventive protocols, quality metrics, and best practice recommendations. A written personalized care plan for preventive services as well as general preventive health recommendations were provided to patient.     Donne Anon, CMA   11/16/2022   After Visit Summary: (MyChart) Due to this being a telephonic visit, the after visit summary with patients personalized plan was offered to patient via MyChart   Nurse Notes: None

## 2022-11-16 NOTE — Patient Instructions (Signed)
Chase Martinez , Thank you for taking time to come for your Medicare Wellness Visit. I appreciate your ongoing commitment to your health goals. Please review the following plan we discussed and let me know if I can assist you in the future.   This is a list of the screening recommended for you and due dates:  Health Maintenance  Topic Date Due   COVID-19 Vaccine (8 - 2023-24 season) 12/06/2022*   Hemoglobin A1C  12/07/2022   Flu Shot  12/23/2022   Yearly kidney health urinalysis for diabetes  06/09/2023   Complete foot exam   06/09/2023   Eye exam for diabetics  10/05/2023   Yearly kidney function blood test for diabetes  10/21/2023   Medicare Annual Wellness Visit  11/16/2023   DTaP/Tdap/Td vaccine (5 - Td or Tdap) 12/04/2031   Pneumonia Vaccine  Completed   Zoster (Shingles) Vaccine  Completed   HPV Vaccine  Aged Out  *Topic was postponed. The date shown is not the original due date.     Next appointment: Follow up in one year for your annual wellness visit.   Preventive Care 84 Years and Older, Male Preventive care refers to lifestyle choices and visits with your health care provider that can promote health and wellness. What does preventive care include? A yearly physical exam. This is also called an annual well check. Dental exams once or twice a year. Routine eye exams. Ask your health care provider how often you should have your eyes checked. Personal lifestyle choices, including: Daily care of your teeth and gums. Regular physical activity. Eating a healthy diet. Avoiding tobacco and drug use. Limiting alcohol use. Practicing safe sex. Taking low doses of aspirin every day. Taking vitamin and mineral supplements as recommended by your health care provider. What happens during an annual well check? The services and screenings done by your health care provider during your annual well check will depend on your age, overall health, lifestyle risk factors, and family history  of disease. Counseling  Your health care provider may ask you questions about your: Alcohol use. Tobacco use. Drug use. Emotional well-being. Home and relationship well-being. Sexual activity. Eating habits. History of falls. Memory and ability to understand (cognition). Work and work Astronomer. Screening  You may have the following tests or measurements: Height, weight, and BMI. Blood pressure. Lipid and cholesterol levels. These may be checked every 5 years, or more frequently if you are over 24 years old. Skin check. Lung cancer screening. You may have this screening every year starting at age 84 if you have a 30-pack-year history of smoking and currently smoke or have quit within the past 15 years. Fecal occult blood test (FOBT) of the stool. You may have this test every year starting at age 84. Flexible sigmoidoscopy or colonoscopy. You may have a sigmoidoscopy every 5 years or a colonoscopy every 10 years starting at age 84. Prostate cancer screening. Recommendations will vary depending on your family history and other risks. Hepatitis C blood test. Hepatitis B blood test. Sexually transmitted disease (STD) testing. Diabetes screening. This is done by checking your blood sugar (glucose) after you have not eaten for a while (fasting). You may have this done every 1-3 years. Abdominal aortic aneurysm (AAA) screening. You may need this if you are a current or former smoker. Osteoporosis. You may be screened starting at age 42 if you are at high risk. Talk with your health care provider about your test results, treatment options, and if necessary,  the need for more tests. Vaccines  Your health care provider may recommend certain vaccines, such as: Influenza vaccine. This is recommended every year. Tetanus, diphtheria, and acellular pertussis (Tdap, Td) vaccine. You may need a Td booster every 10 years. Zoster vaccine. You may need this after age 84. Pneumococcal 13-valent  conjugate (PCV13) vaccine. One dose is recommended after age 84. Pneumococcal polysaccharide (PPSV23) vaccine. One dose is recommended after age 84. Talk to your health care provider about which screenings and vaccines you need and how often you need them. This information is not intended to replace advice given to you by your health care provider. Make sure you discuss any questions you have with your health care provider. Document Released: 06/06/2015 Document Revised: 01/28/2016 Document Reviewed: 03/11/2015 Elsevier Interactive Patient Education  2017 [place] Prevention in the Home Falls can cause injuries. They can happen to people of all ages. There are many things you can do to make your home safe and to help prevent falls. What can I do on the outside of my home? Regularly fix the edges of walkways and driveways and fix any cracks. Remove anything that might make you trip as you walk through a door, such as a raised step or threshold. Trim any bushes or trees on the path to your home. Use bright outdoor lighting. Clear any walking paths of anything that might make someone trip, such as rocks or tools. Regularly check to see if handrails are loose or broken. Make sure that both sides of any steps have handrails. Any raised decks and porches should have guardrails on the edges. Have any leaves, snow, or ice cleared regularly. Use sand or salt on walking paths during winter. Clean up any spills in your garage right away. This includes oil or grease spills. What can I do in the bathroom? Use night lights. Install grab bars by the toilet and in the tub and shower. Do not use towel bars as grab bars. Use non-skid mats or decals in the tub or shower. If you need to sit down in the shower, use a plastic, non-slip stool. Keep the floor dry. Clean up any water that spills on the floor as soon as it happens. Remove soap buildup in the tub or shower regularly. Attach bath mats  securely with double-sided non-slip rug tape. Do not have throw rugs and other things on the floor that can make you trip. What can I do in the bedroom? Use night lights. Make sure that you have a light by your bed that is easy to reach. Do not use any sheets or blankets that are too big for your bed. They should not hang down onto the floor. Have a firm chair that has side arms. You can use this for support while you get dressed. Do not have throw rugs and other things on the floor that can make you trip. What can I do in the kitchen? Clean up any spills right away. Avoid walking on wet floors. Keep items that you use a lot in easy-to-reach places. If you need to reach something above you, use a strong step stool that has a grab bar. Keep electrical cords out of the way. Do not use floor polish or wax that makes floors slippery. If you must use wax, use non-skid floor wax. Do not have throw rugs and other things on the floor that can make you trip. What can I do with my stairs? Do not leave any items on  the stairs. Make sure that there are handrails on both sides of the stairs and use them. Fix handrails that are broken or loose. Make sure that handrails are as long as the stairways. Check any carpeting to make sure that it is firmly attached to the stairs. Fix any carpet that is loose or worn. Avoid having throw rugs at the top or bottom of the stairs. If you do have throw rugs, attach them to the floor with carpet tape. Make sure that you have a light switch at the top of the stairs and the bottom of the stairs. If you do not have them, ask someone to add them for you. What else can I do to help prevent falls? Wear shoes that: Do not have high heels. Have rubber bottoms. Are comfortable and fit you well. Are closed at the toe. Do not wear sandals. If you use a stepladder: Make sure that it is fully opened. Do not climb a closed stepladder. Make sure that both sides of the stepladder  are locked into place. Ask someone to hold it for you, if possible. Clearly mark and make sure that you can see: Any grab bars or handrails. First and last steps. Where the edge of each step is. Use tools that help you move around (mobility aids) if they are needed. These include: Canes. Walkers. Scooters. Crutches. Turn on the lights when you go into a dark area. Replace any light bulbs as soon as they burn out. Set up your furniture so you have a clear path. Avoid moving your furniture around. If any of your floors are uneven, fix them. If there are any pets around you, be aware of where they are. Review your medicines with your doctor. Some medicines can make you feel dizzy. This can increase your chance of falling. Ask your doctor what other things that you can do to help prevent falls. This information is not intended to replace advice given to you by your health care provider. Make sure you discuss any questions you have with your health care provider. Document Released: 03/06/2009 Document Revised: 10/16/2015 Document Reviewed: 06/14/2014 Elsevier Interactive Patient Education  2017 [employer].

## 2022-11-30 DIAGNOSIS — R8271 Bacteriuria: Secondary | ICD-10-CM | POA: Diagnosis not present

## 2022-11-30 DIAGNOSIS — N401 Enlarged prostate with lower urinary tract symptoms: Secondary | ICD-10-CM | POA: Diagnosis not present

## 2022-11-30 DIAGNOSIS — R3912 Poor urinary stream: Secondary | ICD-10-CM | POA: Diagnosis not present

## 2022-11-30 DIAGNOSIS — N3000 Acute cystitis without hematuria: Secondary | ICD-10-CM | POA: Diagnosis not present

## 2022-12-06 ENCOUNTER — Other Ambulatory Visit: Payer: Self-pay | Admitting: Internal Medicine

## 2022-12-07 ENCOUNTER — Encounter: Payer: Self-pay | Admitting: Internal Medicine

## 2022-12-07 ENCOUNTER — Ambulatory Visit (INDEPENDENT_AMBULATORY_CARE_PROVIDER_SITE_OTHER): Payer: Medicare Other | Admitting: Internal Medicine

## 2022-12-07 VITALS — BP 122/76 | HR 71 | Temp 97.8°F | Resp 16 | Ht 72.0 in | Wt 195.0 lb

## 2022-12-07 DIAGNOSIS — E7800 Pure hypercholesterolemia, unspecified: Secondary | ICD-10-CM

## 2022-12-07 DIAGNOSIS — N400 Enlarged prostate without lower urinary tract symptoms: Secondary | ICD-10-CM

## 2022-12-07 DIAGNOSIS — I10 Essential (primary) hypertension: Secondary | ICD-10-CM

## 2022-12-07 DIAGNOSIS — E1140 Type 2 diabetes mellitus with diabetic neuropathy, unspecified: Secondary | ICD-10-CM | POA: Diagnosis not present

## 2022-12-07 LAB — BASIC METABOLIC PANEL
BUN: 21 mg/dL (ref 6–23)
CO2: 28 mEq/L (ref 19–32)
Calcium: 10 mg/dL (ref 8.4–10.5)
Chloride: 99 mEq/L (ref 96–112)
Creatinine, Ser: 1.11 mg/dL (ref 0.40–1.50)
GFR: 61.13 mL/min (ref >60.00)
Glucose, Bld: 102 mg/dL — ABNORMAL HIGH (ref 70–99)
Potassium: 4.8 mEq/L (ref 3.5–5.1)
Sodium: 134 mEq/L — ABNORMAL LOW (ref 135–145)

## 2022-12-07 LAB — LIPID PANEL
Cholesterol: 122 mg/dL (ref 0–200)
HDL: 47.9 mg/dL (ref >39.00)
LDL Cholesterol: 63 mg/dL (ref 0–99)
NonHDL: 74.58
Total CHOL/HDL Ratio: 3
Triglycerides: 57 mg/dL (ref 0.0–149.0)
VLDL: 11.4 mg/dL (ref 0.0–40.0)

## 2022-12-07 LAB — HEMOGLOBIN A1C: Hgb A1c MFr Bld: 6.5 % (ref 4.6–6.5)

## 2022-12-07 LAB — AST: AST: 19 U/L (ref 0–37)

## 2022-12-07 LAB — ALT: ALT: 16 U/L (ref 0–53)

## 2022-12-07 NOTE — Patient Instructions (Addendum)
Vaccines I recommend: Flu shot this fall Consider COVID booster this fall  Continue checking your blood pressure and blood sugars.      GO TO THE LAB : Get the blood work     GO TO THE FRONT DESK, PLEASE SCHEDULE YOUR APPOINTMENTS Come back for checkup in 4 to 6 months

## 2022-12-07 NOTE — Progress Notes (Unsigned)
Subjective:    Patient ID: Chase Martinez, male    DOB: 17-Jan-1941, 82 y.o.   MRN: [id number]  DOS:  12/07/2022 Type of visit - description: Routine follow-up  Chronic medical problems addressed. Had a number of urological issues, notes from the ER and urology reviewed.  Currently doing well, no fever or chills, no major problems with LUTS. Denies chest pain or difficulty breathing.  No palpitation.  + Weight loss noted, states he is eating much healthier.     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   Internal hemorrhoids    Premature atrial contractions    Tubular adenoma of colon     Past Surgical History:  Procedure Laterality Date   CATARACT EXTRACTION Bilateral 05/24/2013   EYE SURGERY  Cataracts Removal   PACEMAKER IMPLANT N/A 04/03/2020   Procedure: PACEMAKER IMPLANT;  Surgeon: Lanier Prude, MD;  [facility];  Service: Cardiovascular;  Laterality: N/A;   PILONIDAL CYST EXCISION     REFRACTIVE SURGERY     SHOULDER SURGERY     right   TONSILLECTOMY      Current Outpatient Medications  Medication Instructions   Apoaequorin (PREVAGEN PO) Oral   atorvastatin (LIPITOR) 20 mg, Oral, Daily   Black Elderberry 1000 MG CAPS Oral   finasteride (PROSCAR) 5 mg, Oral, Daily   Fish Oil 1,000 mg, Oral, Daily,     furosemide (LASIX) 20 mg, Oral, Daily   glucose blood (FREESTYLE LITE) test strip USE TO CHECK BLOOD SUGAR, NOT MORE THAN TWICE A DAY.   Lancets (FREESTYLE) lancets USE LANCET AS DIRECTED TO CHECK BLOOD SUGARS 3 TO 4  TIMES DAILY   metFORMIN (GLUCOPHAGE) 1000 MG tablet TAKE 1 TABLET TWICE A DAY WITH MEALS   multivitamin (THERAGRAN) per tablet 1 tablet, Oral, Daily,     pioglitazone (ACTOS) 30 mg, Oral, Daily   sitaGLIPtin (JANUVIA) 100 mg, Oral, Daily   tamsulosin (FLOMAX) 0.4 MG CAPS capsule TAKE 1 CAPSULE DAILY AFTER SUPPER   vitamin C 1,000 mg, Oral, Daily   Vitamin D3 2,000 Units, Oral, Daily       Objective:   Physical Exam BP 122/76   Pulse 71   Temp 97.8 F (36.6 C) (Oral)   Resp 16   Ht 6' (1.829 m)   Wt 195 lb (88.5 kg)   SpO2 97%   BMI 26.45 kg/m  General:   Well developed, NAD, BMI noted. HEENT:  Normocephalic . Face symmetric, atraumatic Lungs:  CTA B Normal respiratory effort, no intercostal retractions, no accessory muscle use. Heart: RRR,  no murmur.  Lower extremities: no pretibial edema bilaterally  Skin: Not pale. Not jaundice Neurologic:  alert & oriented X3.  Speech normal, gait appropriate for age and unassisted Psych--  Cognition and judgment appear intact.  Cooperative with normal attention span and concentration.  Behavior appropriate. No anxious or depressed appearing.      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 Yetta Barre Nail dystrophy  Asbestosis dx 2019 Shingles 04-2020  PLAN: DM: Doing great with diet, + weight loss, continue metformin, Actos, Januvia.  Ambulatory CBGs every-111.  Check A1c, further advised for results. HTN: On Lasix, ambulatory BPs average 128/60.  Readings are better since he started losing weight.  Will check BMP given recent urological problems. High cholesterol: On atorvastatin, check FLP, further advised for results. BPH, urinary retention: Went to the emergency room 10/21/2022, Dx with urinary retention, catheter was placed, was referred to urology.   Has seen urology couple times, they added finasteride to tamsulosin. Last seen 11/30/2022, UA still showing possible infection, they plan to see him again in a couple of weeks. Currently not needing a foley and feeling well. Preventive care reviewed: Recommend COVID booster this fall and a flu shot. CCS: Reports he talked with GI and they decided no further screening colonoscopies Prostate cancer screening: Sees urology [facility] POA: Scanned  RTC 4 to 6 months

## 2022-12-08 NOTE — Assessment & Plan Note (Addendum)
DM: Doing great with diet, + weight loss, continue metformin, Actos, Januvia.  Ambulatory CBGs every-111.  Check A1c, further advised for results. HTN: On Lasix, ambulatory BPs average 128/60.  Readings are better since he started losing weight.  Will check BMP given recent urological problems. High cholesterol: On atorvastatin, check FLP, further advised for results. BPH, urinary retention: Went to the emergency room 10/21/2022, Dx with urinary retention, catheter was placed, was referred to urology.  Has seen urology couple times, they added finasteride to tamsulosin. Last seen 11/30/2022, UA still showing possible infection, they plan to see him again in a couple of weeks. Currently not needing a foley and feeling well. Preventive care reviewed RTC 4 to 6 months

## 2022-12-08 NOTE — Assessment & Plan Note (Signed)
Preventive care reviewed: Recommend COVID booster this fall and a flu shot. CCS: Reports he talked with GI and they decided no further screening colonoscopies Prostate cancer screening: Sees urology [facility] POA: Scanned

## 2022-12-11 ENCOUNTER — Encounter: Payer: Self-pay | Admitting: Internal Medicine

## 2022-12-14 DIAGNOSIS — N3000 Acute cystitis without hematuria: Secondary | ICD-10-CM | POA: Diagnosis not present

## 2022-12-14 DIAGNOSIS — R3912 Poor urinary stream: Secondary | ICD-10-CM | POA: Diagnosis not present

## 2022-12-14 DIAGNOSIS — N401 Enlarged prostate with lower urinary tract symptoms: Secondary | ICD-10-CM | POA: Diagnosis not present

## 2022-12-27 ENCOUNTER — Other Ambulatory Visit: Payer: Self-pay | Admitting: Family

## 2022-12-28 DIAGNOSIS — I739 Peripheral vascular disease, unspecified: Secondary | ICD-10-CM | POA: Diagnosis not present

## 2022-12-28 DIAGNOSIS — E1151 Type 2 diabetes mellitus with diabetic peripheral angiopathy without gangrene: Secondary | ICD-10-CM | POA: Diagnosis not present

## 2022-12-28 DIAGNOSIS — L84 Corns and callosities: Secondary | ICD-10-CM | POA: Diagnosis not present

## 2022-12-28 DIAGNOSIS — B351 Tinea unguium: Secondary | ICD-10-CM | POA: Diagnosis not present

## 2022-12-28 DIAGNOSIS — L603 Nail dystrophy: Secondary | ICD-10-CM | POA: Diagnosis not present

## 2022-12-30 ENCOUNTER — Ambulatory Visit (INDEPENDENT_AMBULATORY_CARE_PROVIDER_SITE_OTHER): Payer: Medicare Other

## 2022-12-30 DIAGNOSIS — I442 Atrioventricular block, complete: Secondary | ICD-10-CM

## 2023-01-07 ENCOUNTER — Ambulatory Visit (HOSPITAL_BASED_OUTPATIENT_CLINIC_OR_DEPARTMENT_OTHER)
Admission: RE | Admit: 2023-01-07 | Discharge: 2023-01-07 | Disposition: A | Discharge status: Home / Self Care | Payer: Medicare Other | Source: Ambulatory Visit | Attending: Pulmonary Disease | Admitting: Pulmonary Disease

## 2023-01-07 DIAGNOSIS — R590 Localized enlarged lymph nodes: Secondary | ICD-10-CM | POA: Diagnosis not present

## 2023-01-07 DIAGNOSIS — J61 Pneumoconiosis due to asbestos and other mineral fibers: Secondary | ICD-10-CM | POA: Diagnosis not present

## 2023-01-07 DIAGNOSIS — J929 Pleural plaque without asbestos: Secondary | ICD-10-CM | POA: Diagnosis not present

## 2023-01-07 DIAGNOSIS — R918 Other nonspecific abnormal finding of lung field: Secondary | ICD-10-CM | POA: Diagnosis not present

## 2023-01-16 ENCOUNTER — Encounter: Payer: Self-pay | Admitting: Internal Medicine

## 2023-01-17 MED ORDER — METFORMIN HCL 1000 MG PO TABS: 1000.0000 mg | ORAL_TABLET | Freq: Two times a day (BID) | ORAL | 1 refills | Status: DC

## 2023-01-18 DIAGNOSIS — Z85828 Personal history of other malignant neoplasm of skin: Secondary | ICD-10-CM | POA: Diagnosis not present

## 2023-01-18 DIAGNOSIS — D225 Melanocytic nevi of trunk: Secondary | ICD-10-CM | POA: Diagnosis not present

## 2023-01-18 DIAGNOSIS — L570 Actinic keratosis: Secondary | ICD-10-CM | POA: Diagnosis not present

## 2023-01-18 DIAGNOSIS — L821 Other seborrheic keratosis: Secondary | ICD-10-CM | POA: Diagnosis not present

## 2023-01-25 ENCOUNTER — Encounter: Payer: Self-pay | Admitting: Pulmonary Disease

## 2023-01-25 ENCOUNTER — Ambulatory Visit (INDEPENDENT_AMBULATORY_CARE_PROVIDER_SITE_OTHER): Payer: Medicare Other | Admitting: Pulmonary Disease

## 2023-01-25 VITALS — BP 114/60 | HR 78 | Temp 98.0°F | Ht 72.0 in | Wt 196.6 lb

## 2023-01-25 DIAGNOSIS — J849 Interstitial pulmonary disease, unspecified: Secondary | ICD-10-CM

## 2023-01-25 DIAGNOSIS — Z5181 Encounter for therapeutic drug level monitoring: Secondary | ICD-10-CM

## 2023-01-25 DIAGNOSIS — Z23 Encounter for immunization: Secondary | ICD-10-CM | POA: Diagnosis not present

## 2023-01-25 LAB — PULMONARY FUNCTION TEST
DL/VA % pred: 110 %
DL/VA: 4.19 ml/min/mmHg/L
DLCO cor % pred: 111 %
DLCO cor: 28.35 ml/min/mmHg
DLCO unc % pred: 111 %
DLCO unc: 28.35 ml/min/mmHg
FEF 25-75 Post: 3.58 L/s
FEF 25-75 Pre: 3.03 L/s
FEF2575-%Change-Post: 18 %
FEF2575-%Pred-Post: 182 %
FEF2575-%Pred-Pre: 154 %
FEV1-%Change-Post: 3 %
FEV1-%Pred-Post: 119 %
FEV1-%Pred-Pre: 116 %
FEV1-Post: 3.54 L
FEV1-Pre: 3.44 L
FEV1FVC-%Change-Post: 6 %
FEV1FVC-%Pred-Pre: 109 %
FEV6-%Change-Post: -1 %
FEV6-%Pred-Post: 110 %
FEV6-%Pred-Pre: 111 %
FEV6-Post: 4.31 L
FEV6-Pre: 4.36 L
FEV6FVC-%Change-Post: 1 %
FEV6FVC-%Pred-Post: 107 %
FEV6FVC-%Pred-Pre: 105 %
FVC-%Change-Post: -2 %
FVC-%Pred-Post: 102 %
FVC-%Pred-Pre: 106 %
FVC-Post: 4.32 L
FVC-Pre: 4.45 L
Post FEV1/FVC ratio: 82 %
Post FEV6/FVC ratio: 100 %
Pre FEV1/FVC ratio: 77 %
Pre FEV6/FVC Ratio: 98 %
RV % pred: 96 %
RV: 2.76 L
TLC % pred: 94 %
TLC: 7.03 L

## 2023-01-25 NOTE — Progress Notes (Signed)
Full PFT performed today. °

## 2023-01-25 NOTE — Progress Notes (Signed)
Chase Martinez    [id number]    [birth year]  Primary Care Physician:Paz, Nolon Rod, MD  Referring Physician: Wanda Plump, MD [address] [place],  [place] [address]  Chief complaint: Follow-up for asbestosis  HPI: 82 y.o. with history of asbestos exposure, hypertension, hyperlipidemia, diabetes.  He is here for evaluation of asbestos-related lung disease.  Reports significant asbestos exposure while working in [employer] Denies any pulmonary symptoms.  No dyspnea, cough, sputum production, wheezing.  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  No mold, hot tub, Jacuzzi. Smoking history: 20-pack-year smoking history.  Quit in 1968 Travel history: Lived in [place], [place], [place].  Recent travel to [place]. Relevant family history: No significant family history of lung issues.  Interim history: He is here for review of CT and PFTs States that breathing is stable Has an active lifestyle.  Plays golf 3 times a week  [facility] has recognized that his asbestosis came from exposure during his work at the [employer].  Outpatient Encounter Medications as of 01/25/2023  Medication Sig   Apoaequorin (PREVAGEN PO) Take by mouth.   Ascorbic Acid (VITAMIN C) 1000 MG tablet Take 1,000 mg by mouth daily.    atorvastatin (LIPITOR) 20 MG tablet Take 1 tablet (20 mg total) by mouth daily.   Black Elderberry 1000 MG CAPS Take by mouth.   Cholecalciferol (VITAMIN D3) 50 MCG (2000 UT) capsule Take 2,000 Units by mouth daily.    finasteride (PROSCAR) 5 MG tablet Take 5 mg by mouth daily.   furosemide (LASIX) 20 MG tablet Take 1 tablet (20 mg total) by mouth daily.   glucose blood (FREESTYLE LITE) test strip USE TO CHECK BLOOD SUGAR, NOT MORE THAN TWICE A DAY.   Lancets (FREESTYLE) lancets USE LANCET AS DIRECTED TO CHECK  BLOOD SUGARS 3 TO 4 TIMES DAILY   metFORMIN (GLUCOPHAGE) 1000 MG tablet Take 1 tablet (1,000 mg total) by mouth 2 (two) times daily with a meal.   multivitamin (THERAGRAN) per tablet Take 1 tablet by mouth daily.   Omega-3 Fatty Acids (FISH OIL) 1000 MG CAPS Take 1,000 mg by mouth daily.   pioglitazone (ACTOS) 30 MG tablet Take 1 tablet (30 mg total) by mouth daily.   sitaGLIPtin (JANUVIA) 100 MG tablet Take 1 tablet (100 mg total) by mouth daily.   tamsulosin (FLOMAX) 0.4 MG CAPS capsule Take 1 capsule (0.4 mg total) by mouth in the morning and at bedtime.   No facility-administered encounter medications on file as of 01/25/2023.   Physical Exam: Blood pressure 114/60, pulse 78, temperature 98 F (36.7 C), temperature source Oral, height 6' (1.829 m), weight 196 lb 9.6 oz (89.2 kg), SpO2 97%. Gen:      No acute distress HEENT:  EOMI, sclera anicteric Neck:     No masses; no thyromegaly Lungs:    Clear to auscultation bilaterally; normal respiratory effort CV:         Regular rate and rhythm; no murmurs Abd:      + bowel sounds; soft, non-tender; no palpable masses, no distension Ext:    No edema; adequate peripheral perfusion Skin:      Warm and dry; no rash Neuro: alert and oriented x 3 Psych: normal mood and affect   Data Reviewed: Imaging: CT chest  07/10/2009- dependent bibasilar atelectasis.  High-resolution CT 10/20/17- minimal septal thickening, subpleural reticulation at the bases.  Mild air trapping.  Tiny calcified granulomas, small pleural plaques Hepatic steatosis, coronary atherosclerosis.  High-resolution CT 12/01/2021-mild subpleural reticulation and traction bronchiectasis.  Indeterminate for UIP.  Stable compared to 2019.  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 I reviewed the images personally.  PFTs  11/17/2017 FVC 4.21 [92%), FEV1 3.43 [104%), F/F 81, TLC 91%, DLCO 75%,  DLCO/VA 90% Minimal diffusion defect  12/16/2020 FVC 4.41 [98%], FEV1 3.68 [116%], F/F 83, TLC 7.56 [98%], DLCO 36.15 [136%] Increased diffusion capacity.  Improvement in lung volumes and diffusion capacity compared to 2019  01/25/2023 FVC 4.32 [102%], FEV1 3.54 [119%], F/F82, TLC 7.03 [94%], DLCO 28.35 [111%] Normal pulmonary function test  Assessment:  Asbestosis. Review of CT scan shows minimal scarring at the bases and minimal diffusion defect consistent with asbestosis.  His condition is more likely than not the result of his exposure during [employer] service  He does have enlarged PA but no symptoms of pulmonary hypertension or cor pulmonale PFTs in 2022 actually show an improvement in lung capacity and diffusion He is asymptomatic and maintaining an active lifestyle and we will continue monitoring him  CT last month shows slight interval progression of his pulmonary fibrosis though PFTs are normal.  Given change in CT scan we discussed antifibrotic therapy and have decided to initiate Ofev.  Health maintenance 03/02/2017-influenza 10/30/2013-Prevnar 08/05/2016-Pneumovax  Plan/Recommendations: -Start Ofev - Check metabolic panel, proBNP  Chilton Greathouse MD [facility] 01/25/2023, 10:08 AM  CC: Wanda Plump, MD

## 2023-01-25 NOTE — Patient Instructions (Signed)
Full PFT performed today. °

## 2023-01-25 NOTE — Patient Instructions (Signed)
Will start you on a medication called Ofev for pulmonary fibrosis Will check comprehensive metabolic panel and proBNP today Follow-up in 3 months

## 2023-01-26 ENCOUNTER — Encounter: Payer: Self-pay | Admitting: Pulmonary Disease

## 2023-01-27 ENCOUNTER — Other Ambulatory Visit: Payer: Self-pay

## 2023-01-27 ENCOUNTER — Other Ambulatory Visit (INDEPENDENT_AMBULATORY_CARE_PROVIDER_SITE_OTHER): Payer: Medicare Other

## 2023-01-27 DIAGNOSIS — J849 Interstitial pulmonary disease, unspecified: Secondary | ICD-10-CM

## 2023-01-27 LAB — COMPREHENSIVE METABOLIC PANEL
ALT: 16 U/L (ref 0–53)
AST: 24 U/L (ref 0–37)
Albumin: 3.9 g/dL (ref 3.5–5.2)
Alkaline Phosphatase: 62 U/L (ref 39–117)
BUN: 20 mg/dL (ref 6–23)
CO2: 29 meq/L (ref 19–32)
Calcium: 9.9 mg/dL (ref 8.4–10.5)
Chloride: 100 meq/L (ref 96–112)
Creatinine, Ser: 1.02 mg/dL (ref 0.40–1.50)
GFR: 67.59 mL/min (ref >60.00)
Glucose, Bld: 154 mg/dL — ABNORMAL HIGH (ref 70–99)
Potassium: 4 meq/L (ref 3.5–5.1)
Sodium: 136 meq/L (ref 135–145)
Total Bilirubin: 0.5 mg/dL (ref 0.2–1.2)
Total Protein: 7 g/dL (ref 6.0–8.3)

## 2023-01-27 NOTE — Telephone Encounter (Signed)
Patient was seen today for labs. NFN

## 2023-01-28 ENCOUNTER — Telehealth: Payer: Self-pay

## 2023-01-28 ENCOUNTER — Other Ambulatory Visit (HOSPITAL_COMMUNITY): Payer: Self-pay

## 2023-01-28 DIAGNOSIS — J849 Interstitial pulmonary disease, unspecified: Secondary | ICD-10-CM

## 2023-01-28 MED ORDER — OFEV 150 MG PO CAPS
150.0000 mg | ORAL_CAPSULE | Freq: Two times a day (BID) | ORAL | 1 refills | Status: DC
Start: 2023-01-28 — End: 2023-07-25

## 2023-01-28 NOTE — Telephone Encounter (Signed)
Received New start paperwork for OFEV. Will update as we work through the benefits process.  Submitted a Prior Authorization request to [employer] for OFEV via CoverMyMeds. Authorization has been APPROVED from 12/29/2022 to 01/28/2024.  Approval letter has not yet been received, however it will be sent to scan center once obtained.   Patient must fill through [facility] (pulmonary fibrosis team). [id number]    [facility] KEYAnnye English Authorization#: [id number]   [employer] Cares and OPENDOORS applications sent to scan center for retention.  Attempted to contact pt to provide update, however I received an automated voice message stating that "calls to this number are being screened by Smart Call Blocker. The number you are calling is not accepting your call. Please hang up" and does not provide opportunity to leave a VM. Will reach out to pt via MyChart.

## 2023-01-28 NOTE — Telephone Encounter (Signed)
Rx for Ofev 150mg  orally twice daily sent to [facility] today. It takes this pharmacy 7-10 business adys to enroll patient and process rx's so this will help prevent delay in pt starting  Chesley Mires, PharmD, MPH, BCPS, CPP Clinical Pharmacist (Rheumatology and Pulmonology)

## 2023-01-28 NOTE — Telephone Encounter (Signed)
Pt returned call and we discussed findings, and I informed him that [facility] would be contacting him to go over clinical information in depth. He states that he has already done his own investigating on the [facility] website, and I advised him that she would be the best person to ask if he had developed any questions while reading. He also stated that he made sure there was no more issues with the phone number, which I was able to confirm upon successfully calling him back.

## 2023-02-01 NOTE — Telephone Encounter (Signed)
Called patient regarding Ofev. He states he has done in-depth research on medication and does not have any questions or concerns with initiating. Provided him with phone number for [facility] [phone]) to schedule shipment later this week. Nothing further needed. Pt advised to return call to [place] or I if any questions  Chesley Mires, PharmD, MPH, BCPS, CPP Clinical Pharmacist (Rheumatology and Pulmonology)

## 2023-02-02 ENCOUNTER — Encounter: Payer: Self-pay | Admitting: Pulmonary Disease

## 2023-02-07 ENCOUNTER — Other Ambulatory Visit: Payer: Self-pay | Admitting: Internal Medicine

## 2023-02-07 NOTE — Telephone Encounter (Signed)
Note from 9/3 has been updated in the patient's chart and the patient is aware.  Nothing further needed.

## 2023-02-15 NOTE — Progress Notes (Signed)
Remote pacemaker transmission.   

## 2023-03-03 DIAGNOSIS — E1151 Type 2 diabetes mellitus with diabetic peripheral angiopathy without gangrene: Secondary | ICD-10-CM | POA: Diagnosis not present

## 2023-03-03 DIAGNOSIS — M21961 Unspecified acquired deformity of right lower leg: Secondary | ICD-10-CM | POA: Diagnosis not present

## 2023-03-03 DIAGNOSIS — S9031XA Contusion of right foot, initial encounter: Secondary | ICD-10-CM | POA: Diagnosis not present

## 2023-03-10 DIAGNOSIS — Z23 Encounter for immunization: Secondary | ICD-10-CM | POA: Diagnosis not present

## 2023-03-12 ENCOUNTER — Encounter: Payer: Self-pay | Admitting: Internal Medicine

## 2023-03-14 ENCOUNTER — Telehealth: Payer: Self-pay | Admitting: Internal Medicine

## 2023-03-14 NOTE — Telephone Encounter (Signed)
Pt dropped off document to be filled out by provider (Form for Diabetic Shoe 3 pages) Pt would like document to be faxed when ready to [phone]. Document put at front office tray under providers name.

## 2023-03-14 NOTE — Telephone Encounter (Signed)
Immunization records updated.

## 2023-03-14 NOTE — Telephone Encounter (Signed)
Form received, Pt will need in person appt w/ PCP for foot exam. Please schedule at his convenience. Thank you.

## 2023-03-15 NOTE — Telephone Encounter (Signed)
LVM for pt to call office and schedule an appt at his convenience to fill out paperwork.

## 2023-03-22 ENCOUNTER — Ambulatory Visit (INDEPENDENT_AMBULATORY_CARE_PROVIDER_SITE_OTHER): Payer: Medicare Other | Admitting: Internal Medicine

## 2023-03-22 ENCOUNTER — Encounter: Payer: Self-pay | Admitting: Internal Medicine

## 2023-03-22 VITALS — BP 132/72 | HR 79 | Temp 97.8°F | Resp 18 | Ht 72.0 in | Wt 197.5 lb

## 2023-03-22 DIAGNOSIS — E1140 Type 2 diabetes mellitus with diabetic neuropathy, unspecified: Secondary | ICD-10-CM | POA: Diagnosis not present

## 2023-03-22 DIAGNOSIS — Z7984 Long term (current) use of oral hypoglycemic drugs: Secondary | ICD-10-CM

## 2023-03-22 NOTE — Patient Instructions (Signed)

## 2023-03-22 NOTE — Progress Notes (Signed)
Subjective:    Patient ID: Chase Martinez, male    DOB: 12-01-1940, 82 y.o.   MRN: [id number]  DOS:  03/22/2023 Type of visit - description: neuropathy eval  Has episodic paresthesias of the feet on the sock distribution described as a heaviness. Typically triggered by sitting for prolonged periods of time.  DM: Well-controlled, ambulatory CBGs on average 117.  Good med compliance.  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   Internal hemorrhoids    Premature atrial contractions    Tubular adenoma of colon     Past Surgical History:  Procedure Laterality Date   CATARACT EXTRACTION Bilateral 05/24/2013   EYE SURGERY  Cataracts Removal   PACEMAKER IMPLANT N/A 04/03/2020   Procedure: PACEMAKER IMPLANT;  Surgeon: Lanier Prude, MD;  Location: [facility];  Service: Cardiovascular;  Laterality: N/A;   PILONIDAL CYST EXCISION     REFRACTIVE SURGERY     SHOULDER SURGERY     right   TONSILLECTOMY      Current Outpatient Medications  Medication Instructions   Apoaequorin (PREVAGEN PO) Oral   atorvastatin (LIPITOR) 20 mg, Oral, Daily   Black Elderberry 1000 MG CAPS Oral   finasteride (PROSCAR) 5 mg, Oral, Daily   Fish Oil 1,000 mg, Oral, Daily,     furosemide (LASIX) 20 mg, Oral, Daily   glucose blood (FREESTYLE LITE) test strip USE TO CHECK BLOOD SUGAR, NOT MORE THAN TWICE A DAY.   Lancets (FREESTYLE) lancets USE LANCET AS DIRECTED TO CHECK BLOOD SUGARS 3 TO 4 TIMES DAILY   metFORMIN (GLUCOPHAGE) 1,000 mg, Oral, 2 times daily with meals   multivitamin (THERAGRAN) per tablet 1 tablet, Oral, Daily,     Ofev 150 mg, Oral, 2 times daily   pioglitazone (ACTOS) 30 mg,  Oral, Daily   sitaGLIPtin (JANUVIA) 100 mg, Oral, Daily   tamsulosin (FLOMAX) 0.4 mg, Oral, 2 times daily   vitamin C 1,000 mg, Oral, Daily   Vitamin D3 2,000 Units, Oral, Daily       Objective:   Physical Exam BP 132/72   Pulse 79   Temp 97.8 F (36.6 C) (Oral)   Resp 18   Ht 6' (1.829 m)   Wt 197 lb 8 oz (89.6 kg)   SpO2 97%   BMI 26.79 kg/m  General:   Well developed, NAD, BMI noted. HEENT:  Normocephalic . Face symmetric, atraumatic DM foot exam: No edema, good pedal pulses. Diminish sensation to pinprick examination distally. + Bony deformities. 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 Skin: Not pale. Not jaundice Neurologic:  alert & oriented X3.  Speech normal, gait appropriate for age and unassisted Psych--  Cognition and judgment appear intact.  Cooperative with normal attention span and concentration.  Behavior appropriate. No anxious or depressed appearing.      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 Yetta Barre Nail dystrophy  Asbestosis dx 2019 Shingles 04-2020  PLAN: DM: Well-controlled per  last A1c, average ambulatory CBGs 117.  Continue metformin, Actos, Januvia. Neuropathy: Physical exam consistent with diabetic neuropathy, feet care discussed.  He would benefit from diabetic shoes RTC as scheduled  (04/2023)

## 2023-03-22 NOTE — Assessment & Plan Note (Signed)
DM: Well-controlled per last A1c, average ambulatory CBGs 117.  Continue metformin, Actos, Januvia. Neuropathy: Physical exam consistent with diabetic neuropathy, feet care discussed.  He would benefit from diabetic shoes RTC as scheduled  (04/2023)

## 2023-03-22 NOTE — Telephone Encounter (Signed)
Form completed and faxed back to [facility] w/ today's OV at [phone]. Form sent for scanning.

## 2023-03-24 DIAGNOSIS — R3912 Poor urinary stream: Secondary | ICD-10-CM | POA: Diagnosis not present

## 2023-03-24 DIAGNOSIS — R338 Other retention of urine: Secondary | ICD-10-CM | POA: Diagnosis not present

## 2023-03-24 DIAGNOSIS — N401 Enlarged prostate with lower urinary tract symptoms: Secondary | ICD-10-CM | POA: Diagnosis not present

## 2023-03-29 DIAGNOSIS — I739 Peripheral vascular disease, unspecified: Secondary | ICD-10-CM | POA: Diagnosis not present

## 2023-03-29 DIAGNOSIS — E1151 Type 2 diabetes mellitus with diabetic peripheral angiopathy without gangrene: Secondary | ICD-10-CM | POA: Diagnosis not present

## 2023-03-29 DIAGNOSIS — L84 Corns and callosities: Secondary | ICD-10-CM | POA: Diagnosis not present

## 2023-03-29 DIAGNOSIS — M2022 Hallux rigidus, left foot: Secondary | ICD-10-CM | POA: Diagnosis not present

## 2023-03-29 DIAGNOSIS — M2021 Hallux rigidus, right foot: Secondary | ICD-10-CM | POA: Diagnosis not present

## 2023-03-29 DIAGNOSIS — M21962 Unspecified acquired deformity of left lower leg: Secondary | ICD-10-CM | POA: Diagnosis not present

## 2023-03-29 DIAGNOSIS — B351 Tinea unguium: Secondary | ICD-10-CM | POA: Diagnosis not present

## 2023-03-29 DIAGNOSIS — L603 Nail dystrophy: Secondary | ICD-10-CM | POA: Diagnosis not present

## 2023-03-31 ENCOUNTER — Ambulatory Visit (INDEPENDENT_AMBULATORY_CARE_PROVIDER_SITE_OTHER): Payer: Medicare Other

## 2023-03-31 DIAGNOSIS — I442 Atrioventricular block, complete: Secondary | ICD-10-CM | POA: Diagnosis not present

## 2023-03-31 LAB — CUP PACEART REMOTE DEVICE CHECK
Battery Remaining Longevity: 103 mo
Battery Remaining Percentage: 78 %
Battery Voltage: 3.01 V
Brady Statistic AP VP Percent: 1 %
Brady Statistic AP VS Percent: 1 %
Brady Statistic AS VP Percent: 1 %
Brady Statistic AS VS Percent: 99 %
Brady Statistic RA Percent Paced: 1 %
Brady Statistic RV Percent Paced: 1 %
Date Time Interrogation Session: 20241107020018
Implantable Lead Connection Status: 753985
Implantable Lead Connection Status: 753985
Implantable Lead Implant Date: 20211111
Implantable Lead Implant Date: 20211111
Implantable Lead Location: 753859
Implantable Lead Location: 753860
Implantable Pulse Generator Implant Date: 20211111
Lead Channel Impedance Value: 490 Ohm
Lead Channel Impedance Value: 510 Ohm
Lead Channel Pacing Threshold Amplitude: 0.5 V
Lead Channel Pacing Threshold Amplitude: 1 V
Lead Channel Pacing Threshold Pulse Width: 0.4 ms
Lead Channel Pacing Threshold Pulse Width: 0.4 ms
Lead Channel Sensing Intrinsic Amplitude: 1.3 mV
Lead Channel Sensing Intrinsic Amplitude: 9.7 mV
Lead Channel Setting Pacing Amplitude: 2 V
Lead Channel Setting Pacing Amplitude: 2.5 V
Lead Channel Setting Pacing Pulse Width: 0.4 ms
Lead Channel Setting Sensing Sensitivity: 2 mV
Pulse Gen Model: 2272
Pulse Gen Serial Number: 3859811

## 2023-04-10 DIAGNOSIS — L603 Nail dystrophy: Secondary | ICD-10-CM | POA: Diagnosis not present

## 2023-04-12 DIAGNOSIS — H524 Presbyopia: Secondary | ICD-10-CM | POA: Diagnosis not present

## 2023-04-12 DIAGNOSIS — H401231 Low-tension glaucoma, bilateral, mild stage: Secondary | ICD-10-CM | POA: Diagnosis not present

## 2023-04-12 DIAGNOSIS — H52203 Unspecified astigmatism, bilateral: Secondary | ICD-10-CM | POA: Diagnosis not present

## 2023-04-12 NOTE — Progress Notes (Signed)
Remote pacemaker transmission.   

## 2023-04-25 ENCOUNTER — Other Ambulatory Visit: Payer: Self-pay | Admitting: Internal Medicine

## 2023-05-03 ENCOUNTER — Encounter: Payer: Self-pay | Admitting: Pulmonary Disease

## 2023-05-03 ENCOUNTER — Ambulatory Visit (INDEPENDENT_AMBULATORY_CARE_PROVIDER_SITE_OTHER): Payer: Medicare Other | Admitting: Pulmonary Disease

## 2023-05-03 VITALS — BP 130/71 | HR 76 | Temp 98.4°F | Ht 72.0 in | Wt 196.0 lb

## 2023-05-03 DIAGNOSIS — Z5181 Encounter for therapeutic drug level monitoring: Secondary | ICD-10-CM | POA: Diagnosis not present

## 2023-05-03 DIAGNOSIS — J849 Interstitial pulmonary disease, unspecified: Secondary | ICD-10-CM

## 2023-05-03 LAB — COMPREHENSIVE METABOLIC PANEL
ALT: 18 U/L (ref 0–53)
AST: 22 U/L (ref 0–37)
Albumin: 3.8 g/dL (ref 3.5–5.2)
Alkaline Phosphatase: 57 U/L (ref 39–117)
BUN: 21 mg/dL (ref 6–23)
CO2: 29 meq/L (ref 19–32)
Calcium: 9.1 mg/dL (ref 8.4–10.5)
Chloride: 101 meq/L (ref 96–112)
Creatinine, Ser: 1.02 mg/dL (ref 0.40–1.50)
GFR: 67.47 mL/min (ref >60.00)
Glucose, Bld: 131 mg/dL — ABNORMAL HIGH (ref 70–99)
Potassium: 4 meq/L (ref 3.5–5.1)
Sodium: 137 meq/L (ref 135–145)
Total Bilirubin: 0.5 mg/dL (ref 0.2–1.2)
Total Protein: 6.5 g/dL (ref 6.0–8.3)

## 2023-05-03 NOTE — Progress Notes (Signed)
Chase Martinez    [id number]    [birth year]  Primary Care Physician:Paz, Nolon Rod, MD  Referring Physician: Wanda Plump, MD [address] [place],  [place] [address]  Chief complaint: Follow-up for asbestosis  HPI:  82 y.o. with history of asbestos exposure, hypertension, hyperlipidemia, diabetes.  He is here for evaluation of asbestos-related lung disease.  Reports significant asbestos exposure while working in [employer] Denies any pulmonary symptoms.  No dyspnea, cough, sputum production, wheezing.  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  No mold, hot tub, Jacuzzi. Smoking history: 20-pack-year smoking history.  Quit in 1968 Travel history: Lived in [place], [place], [place].  Recent travel to [place]. Relevant family history: No significant family history of lung issues.  Interim history: Discussed the use of AI scribe software for clinical note transcription with the patient, who gave verbal consent to proceed.  The patient, with a history of asbestosis secondary to occupational asbestos exposure, presents for a follow-up after starting nintedanib (Ofev) in September 2024. He reports tolerating the medication well, with the only side effect being loose stools, which he manages with an anti-diarrheal medication as needed. He has been on the medication for approximately four months and has had three refills. Despite the diagnosis of asbestosis, the patient reports no shortness of breath or other respiratory symptoms. He remains active, regularly playing golf.   [facility] has recognized that his asbestosis came from exposure during his work at the [employer].  Outpatient Encounter Medications as of 05/03/2023  Medication Sig   Apoaequorin (PREVAGEN PO) Take by mouth.   Ascorbic Acid (VITAMIN C) 1000 MG  tablet Take 1,000 mg by mouth daily.    atorvastatin (LIPITOR) 20 MG tablet Take 1 tablet (20 mg total) by mouth daily.   Black Elderberry 1000 MG CAPS Take by mouth.   Cholecalciferol (VITAMIN D3) 50 MCG (2000 UT) capsule Take 2,000 Units by mouth daily.    finasteride (PROSCAR) 5 MG tablet Take 5 mg by mouth daily.   furosemide (LASIX) 20 MG tablet Take 1 tablet (20 mg total) by mouth daily.   glucose blood (FREESTYLE LITE) test strip USE TO CHECK BLOOD SUGAR, NOT MORE THAN TWICE A DAY   Lancets (FREESTYLE) lancets Check blood sugars no more than twice daily   metFORMIN (GLUCOPHAGE) 1000 MG tablet Take 1 tablet (1,000 mg total) by mouth 2 (two) times daily with a meal.   multivitamin (THERAGRAN) per tablet Take 1 tablet by mouth daily.   Nintedanib (OFEV) 150 MG CAPS Take 1 capsule (150 mg total) by mouth 2 (two) times daily.   Omega-3 Fatty Acids (FISH OIL) 1000 MG CAPS Take 1,000 mg by mouth daily.   pioglitazone (ACTOS) 30 MG tablet Take 1 tablet (30 mg total) by mouth daily.   sitaGLIPtin (JANUVIA) 100 MG tablet Take 1 tablet (100 mg total) by mouth daily.   tamsulosin (FLOMAX) 0.4 MG CAPS capsule Take 1 capsule (0.4 mg total) by mouth in the morning and at bedtime.   No facility-administered encounter medications on file as of 05/03/2023.   Physical Exam: Blood pressure 114/60, pulse 78, temperature 98 F (36.7 C), temperature source Oral, height 6' (1.829 m), weight 196 lb 9.6 oz (89.2 kg), SpO2 97%. Gen:      No acute  distress HEENT:  EOMI, sclera anicteric Neck:     No masses; no thyromegaly Lungs:    Clear to auscultation bilaterally; normal respiratory effort CV:         Regular rate and rhythm; no murmurs Abd:      + bowel sounds; soft, non-tender; no palpable masses, no distension Ext:    No edema; adequate peripheral perfusion Skin:      Warm and dry; no rash Neuro: alert and oriented x 3 Psych: normal mood and affect   Data Reviewed: Imaging: CT chest 07/10/2009-  dependent bibasilar atelectasis.  High-resolution CT 10/20/17- minimal septal thickening, subpleural reticulation at the bases.  Mild air trapping.  Tiny calcified granulomas, small pleural plaques Hepatic steatosis, coronary atherosclerosis.  High-resolution CT 12/01/2021-mild subpleural reticulation and traction bronchiectasis.  Indeterminate for UIP.  Stable compared to 2019.  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 I reviewed the images personally.  PFTs  11/17/2017 FVC 4.21 [92%), FEV1 3.43 [104%), F/F 81, TLC 91%, DLCO 75%, DLCO/VA 90% Minimal diffusion defect  12/16/2020 FVC 4.41 [98%], FEV1 3.68 [116%], F/F 83, TLC 7.56 [98%], DLCO 36.15 [136%] Increased diffusion capacity.  Improvement in lung volumes and diffusion capacity compared to 2019  01/25/2023 FVC 4.32 [102%], FEV1 3.54 [119%], F/F82, TLC 7.03 [94%], DLCO 28.35 [111%] Normal pulmonary function test  Assessment:  Asbestosis. Review of CT scan shows minimal scarring at the bases and minimal diffusion defect consistent with asbestosis.  His condition is more likely than not the result of his exposure during [employer] service  He does have enlarged PA but no symptoms of pulmonary hypertension or cor pulmonale PFTs in 2022 actually show an improvement in lung capacity and diffusion He is asymptomatic and maintaining an active lifestyle and we will continue monitoring him  CT last month shows slight interval progression of his pulmonary fibrosis though PFTs are normal.  Given change in CT scan we discussed antifibrotic therapy and have decided to initiate Ofev. He reports tolerating the medication well, with the only side effect being loose stools, which he manages with an anti-diarrheal medication as needed. Despite the diagnosis of asbestosis, the patient reports no shortness of breath or other respiratory symptoms. He remains active,  regularly playing golf.  Will check labs today for therapeutic monitoring  Health maintenance 03/02/2017-influenza 10/30/2013-Prevnar 08/05/2016-Pneumovax  Plan/Recommendations: Continue Ofev Check complaints metabolic panel  Chilton Greathouse MD [facility] 05/03/2023, 1:47 PM  CC: Wanda Plump, MD

## 2023-05-03 NOTE — Patient Instructions (Signed)
VISIT SUMMARY:  You had a follow-up appointment to check on your asbestosis after starting the medication nintedanib (Ofev). You reported that you are tolerating the medication well, with the only side effect being loose stools, which you manage with an anti-diarrheal medication as needed. You have been on the medication for about four months and have had three refills. Despite your diagnosis, you are not experiencing shortness of breath or other respiratory symptoms and remain active, regularly playing golf.  YOUR PLAN:  -ASBESTOSIS: Asbestosis is a lung disease caused by inhaling asbestos fibers, leading to lung scarring. Your respiratory status is stable with no shortness of breath, although your CT scan showed some worsening. You will continue taking Nintedanib (Ofev) to manage the condition. We will check your liver function today because the medication can affect your liver. You should follow up in 6 months for another CT scan.  -GENERAL HEALTH MAINTENANCE: Continue your active lifestyle, including playing golf, as it is beneficial for your overall health.  INSTRUCTIONS:  Please get your liver function tests done today. Schedule a follow-up appointment in 6 months, which will include a CT scan to monitor your condition.

## 2023-05-10 ENCOUNTER — Ambulatory Visit (INDEPENDENT_AMBULATORY_CARE_PROVIDER_SITE_OTHER): Payer: Medicare Other | Admitting: Internal Medicine

## 2023-05-10 ENCOUNTER — Encounter: Payer: Self-pay | Admitting: Internal Medicine

## 2023-05-10 VITALS — BP 136/66 | HR 75 | Temp 97.6°F | Resp 16 | Ht 72.0 in | Wt 195.5 lb

## 2023-05-10 DIAGNOSIS — E1140 Type 2 diabetes mellitus with diabetic neuropathy, unspecified: Secondary | ICD-10-CM

## 2023-05-10 DIAGNOSIS — I10 Essential (primary) hypertension: Secondary | ICD-10-CM | POA: Diagnosis not present

## 2023-05-10 DIAGNOSIS — Z7984 Long term (current) use of oral hypoglycemic drugs: Secondary | ICD-10-CM | POA: Diagnosis not present

## 2023-05-10 LAB — MICROALBUMIN / CREATININE URINE RATIO
Creatinine,U: 26.6 mg/dL
Microalb Creat Ratio: 2.6 mg/g (ref 0.0–30.0)
Microalb, Ur: <0.7 mg/dL (ref 0.0–1.9)

## 2023-05-10 LAB — HEMOGLOBIN A1C: Hgb A1c MFr Bld: 6.3 % (ref 4.6–6.5)

## 2023-05-10 NOTE — Progress Notes (Signed)
Subjective:    Patient ID: Chase Martinez, male    DOB: [birth year], 82 y.o.   MRN: [id number]  DOS:  05/10/2023 Type of visit - description: f/u  Since the last office visit is doing well. Notes from urology and pulmonary reviewed. Review of his ambulatory CBGs and BPs.  He is able to play golf without major problems. Denies fever chills or cough at this point.  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   atorvastatin (LIPITOR) 20 mg, Oral, Daily   Black Elderberry 1000 MG CAPS Take by mouth.   finasteride (PROSCAR) 5 mg, Daily   Fish Oil 1,000 mg, Daily   furosemide (LASIX) 20 mg, Oral, Daily   glucose blood (FREESTYLE LITE) test strip USE TO CHECK BLOOD SUGAR, NOT MORE THAN TWICE A DAY   Lancets (FREESTYLE) lancets Check blood sugars no more than twice daily   metFORMIN (GLUCOPHAGE) 1,000 mg, Oral, 2 times daily with meals   multivitamin (THERAGRAN) per tablet 1 tablet, Daily   Ofev 150 mg, Oral, 2 times daily   pioglitazone (ACTOS) 30 mg, Oral, Daily   sitaGLIPtin (JANUVIA) 100 mg,  Oral, Daily   tamsulosin (FLOMAX) 0.4 mg, 2 times daily   vitamin C 1,000 mg, Daily   Vitamin D3 2,000 Units, Daily       Objective:   Physical Exam BP 136/66   Pulse 75   Temp 97.6 F (36.4 C) (Oral)   Resp 16   Ht 6' (1.829 m)   Wt 195 lb 8 oz (88.7 kg)   SpO2 97%   BMI 26.51 kg/m  General:   Well developed, NAD, BMI noted. HEENT:  Normocephalic . Face symmetric, atraumatic Lungs:  CTA B except for question of few Velcro type of crackles at the left base   Normal respiratory effort, no intercostal retractions, no accessory muscle use. Heart: RRR,  no murmur.  Lower extremities: no pretibial edema bilaterally  Skin: Not pale. Not jaundice Neurologic:  alert & oriented X3.  Speech normal, gait appropriate for age and unassisted Psych--  Cognition and judgment appear intact.  Cooperative with normal attention span and concentration.  Behavior appropriate. No anxious or depressed appearing.      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 Yetta Barre Nail dystrophy  Asbestosis  dx 2019 Shingles 04-2020  PLAN: DM: Average ambulatory CBGs in the morning 114. Continue metformin, Actos and Januvia.  Check A1c and micro. HTN: Ambulatory BPs average 130/67, on Lasix, last BMP okay. Asbestosis: LOV pulmonary 05/03/2023.  Started ofev BPH, H/O urinary retention: Saw urology 03/24/2023, stable, next visit 1 year Preventive care: Already had a flu shot, COVID booster and RSV RTC 4 to 5 months

## 2023-05-10 NOTE — Assessment & Plan Note (Signed)
DM: Average ambulatory CBGs in the morning 114. Continue metformin, Actos and Januvia.  Check A1c and micro. HTN: Ambulatory BPs average 130/67, on Lasix, last BMP okay. Asbestosis: LOV pulmonary 05/03/2023.  Started ofev BPH, H/O urinary retention: Saw urology 03/24/2023, stable, next visit 1 year Preventive care: Already had a flu shot, COVID booster and RSV RTC 4 to 5 months

## 2023-05-10 NOTE — Patient Instructions (Signed)
It was good to see you today  GO TO THE LAB : Get the blood work     Next visit with me in 4 to 5 months    Please schedule it at the front desk

## 2023-05-13 ENCOUNTER — Other Ambulatory Visit: Payer: Self-pay | Admitting: Internal Medicine

## 2023-06-07 DIAGNOSIS — L84 Corns and callosities: Secondary | ICD-10-CM | POA: Diagnosis not present

## 2023-06-07 DIAGNOSIS — L603 Nail dystrophy: Secondary | ICD-10-CM | POA: Diagnosis not present

## 2023-06-07 DIAGNOSIS — E1151 Type 2 diabetes mellitus with diabetic peripheral angiopathy without gangrene: Secondary | ICD-10-CM | POA: Diagnosis not present

## 2023-06-07 DIAGNOSIS — I739 Peripheral vascular disease, unspecified: Secondary | ICD-10-CM | POA: Diagnosis not present

## 2023-06-07 DIAGNOSIS — L851 Acquired keratosis [keratoderma] palmaris et plantaris: Secondary | ICD-10-CM | POA: Diagnosis not present

## 2023-06-20 ENCOUNTER — Encounter: Payer: Self-pay | Admitting: Pulmonary Disease

## 2023-06-27 ENCOUNTER — Other Ambulatory Visit: Payer: Self-pay | Admitting: Internal Medicine

## 2023-06-27 ENCOUNTER — Telehealth: Payer: Self-pay

## 2023-06-27 NOTE — Telephone Encounter (Signed)
Diabetic shoe form completed and faxed back to [facility] at [phone]- form sent for scanning.

## 2023-06-30 ENCOUNTER — Ambulatory Visit (INDEPENDENT_AMBULATORY_CARE_PROVIDER_SITE_OTHER): Payer: Medicare Other

## 2023-06-30 DIAGNOSIS — I442 Atrioventricular block, complete: Secondary | ICD-10-CM | POA: Diagnosis not present

## 2023-06-30 LAB — CUP PACEART REMOTE DEVICE CHECK
Battery Remaining Longevity: 101 mo
Battery Remaining Percentage: 76 %
Battery Voltage: 3.01 V
Brady Statistic AP VP Percent: 1 %
Brady Statistic AP VS Percent: 1 %
Brady Statistic AS VP Percent: 1 %
Brady Statistic AS VS Percent: 99 %
Brady Statistic RA Percent Paced: 1 %
Brady Statistic RV Percent Paced: 1 %
Date Time Interrogation Session: 20250206020014
Implantable Lead Connection Status: 753985
Implantable Lead Connection Status: 753985
Implantable Lead Implant Date: 20211111
Implantable Lead Implant Date: 20211111
Implantable Lead Location: 753859
Implantable Lead Location: 753860
Implantable Pulse Generator Implant Date: 20211111
Lead Channel Impedance Value: 480 Ohm
Lead Channel Impedance Value: 540 Ohm
Lead Channel Pacing Threshold Amplitude: 0.5 V
Lead Channel Pacing Threshold Amplitude: 1 V
Lead Channel Pacing Threshold Pulse Width: 0.4 ms
Lead Channel Pacing Threshold Pulse Width: 0.4 ms
Lead Channel Sensing Intrinsic Amplitude: 1 mV
Lead Channel Sensing Intrinsic Amplitude: 11.1 mV
Lead Channel Setting Pacing Amplitude: 2 V
Lead Channel Setting Pacing Amplitude: 2.5 V
Lead Channel Setting Pacing Pulse Width: 0.4 ms
Lead Channel Setting Sensing Sensitivity: 2 mV
Pulse Gen Model: 2272
Pulse Gen Serial Number: 3859811

## 2023-07-02 ENCOUNTER — Encounter: Payer: Self-pay | Admitting: Cardiology

## 2023-07-04 ENCOUNTER — Telehealth: Payer: Self-pay | Admitting: Pulmonary Disease

## 2023-07-04 NOTE — Telephone Encounter (Signed)
 Patient checking on message for Ofev . Phone number is [phone]. May reply on mychart message.

## 2023-07-04 NOTE — Telephone Encounter (Signed)
 Called and spoke to patient.  He stated that he resumed ofev  and determined that he has to take anti diarrhea with it if he leaves the house. He wanted to verify that it would be okay to continue the anti diarrhea. He would like response via mychart.   MR, please advise. Thanks

## 2023-07-07 ENCOUNTER — Other Ambulatory Visit: Payer: Self-pay | Admitting: Internal Medicine

## 2023-07-19 ENCOUNTER — Other Ambulatory Visit: Payer: Self-pay | Admitting: Pulmonary Disease

## 2023-07-19 DIAGNOSIS — J849 Interstitial pulmonary disease, unspecified: Secondary | ICD-10-CM

## 2023-07-19 NOTE — Telephone Encounter (Signed)
 I called and discussed with patient.  He is taking Ofev and Imodium intermittently to help with diarrhea.  I told him it was okay to do so for symptomatic relief.  Nothing further needed

## 2023-07-21 DIAGNOSIS — L570 Actinic keratosis: Secondary | ICD-10-CM | POA: Diagnosis not present

## 2023-07-21 DIAGNOSIS — L821 Other seborrheic keratosis: Secondary | ICD-10-CM | POA: Diagnosis not present

## 2023-07-21 DIAGNOSIS — D225 Melanocytic nevi of trunk: Secondary | ICD-10-CM | POA: Diagnosis not present

## 2023-07-21 DIAGNOSIS — Z85828 Personal history of other malignant neoplasm of skin: Secondary | ICD-10-CM | POA: Diagnosis not present

## 2023-07-25 NOTE — Telephone Encounter (Signed)
 Refill sent for OFEV to [facility] (pulmonary fibrosis team). [phone]  Dose: 150mg  twice daily  Last OV: 05/03/2023 Provider: Dr. Isaiah Serge Pertinent labs: LFTs on 05/03/2023 wnl  Next OV: due in June 2025  Chesley Mires, PharmD, MPH, BCPS Clinical Pharmacist (Rheumatology and Pulmonology)

## 2023-08-04 NOTE — Progress Notes (Signed)
 Remote pacemaker transmission.

## 2023-08-11 DIAGNOSIS — B351 Tinea unguium: Secondary | ICD-10-CM | POA: Diagnosis not present

## 2023-08-11 DIAGNOSIS — I739 Peripheral vascular disease, unspecified: Secondary | ICD-10-CM | POA: Diagnosis not present

## 2023-08-11 DIAGNOSIS — L603 Nail dystrophy: Secondary | ICD-10-CM | POA: Diagnosis not present

## 2023-08-11 DIAGNOSIS — E1151 Type 2 diabetes mellitus with diabetic peripheral angiopathy without gangrene: Secondary | ICD-10-CM | POA: Diagnosis not present

## 2023-08-11 DIAGNOSIS — L851 Acquired keratosis [keratoderma] palmaris et plantaris: Secondary | ICD-10-CM | POA: Diagnosis not present

## 2023-08-11 DIAGNOSIS — L84 Corns and callosities: Secondary | ICD-10-CM | POA: Diagnosis not present

## 2023-09-06 ENCOUNTER — Telehealth: Payer: Self-pay

## 2023-09-06 NOTE — Telephone Encounter (Signed)
 Copied from CRM [id number]. Topic: Clinical - Prescription Issue >> Sep 06, 2023  9:02 AM Chase Martinez wrote: Reason for CRM: Patient calling to state that OFEV gives him diarrhea and he would like advice or an alternative to this medication. Please advise.  Dr Waylan Haggard please advise

## 2023-09-13 ENCOUNTER — Encounter: Payer: Self-pay | Admitting: Pulmonary Disease

## 2023-09-13 DIAGNOSIS — B351 Tinea unguium: Secondary | ICD-10-CM | POA: Diagnosis not present

## 2023-09-13 DIAGNOSIS — I739 Peripheral vascular disease, unspecified: Secondary | ICD-10-CM | POA: Diagnosis not present

## 2023-09-13 DIAGNOSIS — E1151 Type 2 diabetes mellitus with diabetic peripheral angiopathy without gangrene: Secondary | ICD-10-CM | POA: Diagnosis not present

## 2023-09-13 DIAGNOSIS — L84 Corns and callosities: Secondary | ICD-10-CM | POA: Diagnosis not present

## 2023-09-13 DIAGNOSIS — L603 Nail dystrophy: Secondary | ICD-10-CM | POA: Diagnosis not present

## 2023-09-13 DIAGNOSIS — M2021 Hallux rigidus, right foot: Secondary | ICD-10-CM | POA: Diagnosis not present

## 2023-09-13 DIAGNOSIS — L851 Acquired keratosis [keratoderma] palmaris et plantaris: Secondary | ICD-10-CM | POA: Diagnosis not present

## 2023-09-13 DIAGNOSIS — M2022 Hallux rigidus, left foot: Secondary | ICD-10-CM | POA: Diagnosis not present

## 2023-09-14 MED ORDER — LOPERAMIDE HCL 2 MG PO TABS: 2.0000 mg | ORAL_TABLET | Freq: Four times a day (QID) | ORAL | 0 refills | Status: DC | PRN

## 2023-09-14 NOTE — Telephone Encounter (Signed)
 Pt is aware of below message/recommendations and voiced his understanding.  Nothing further needed.

## 2023-09-14 NOTE — Telephone Encounter (Signed)
 I called his number several times but it appears to be out of order I have prescribed Imodium  to be taken as needed to help with diarrhea He can take a break from Ofev  for several weeks and resume after several weeks to see if it will help with his diarrhea.

## 2023-09-14 NOTE — Addendum Note (Signed)
 Addended byPhyllis Breeze on: 09/14/2023 01:08 PM   Modules accepted: Orders

## 2023-09-15 NOTE — Telephone Encounter (Signed)
 I replied to patient directly. Please make a follow up visit with me or APP to discuss alternatives to Ofev 

## 2023-09-15 NOTE — Telephone Encounter (Signed)
 Spoke to patient and scheduled virtual visit for 11/04/22.  Dr. Waylan Haggard, please advise if virtual is okay.

## 2023-09-29 ENCOUNTER — Ambulatory Visit (INDEPENDENT_AMBULATORY_CARE_PROVIDER_SITE_OTHER): Payer: Medicare Other

## 2023-09-29 DIAGNOSIS — I442 Atrioventricular block, complete: Secondary | ICD-10-CM

## 2023-09-29 LAB — CUP PACEART REMOTE DEVICE CHECK
Battery Remaining Longevity: 98 mo
Battery Remaining Percentage: 75 %
Battery Voltage: 3.01 V
Brady Statistic AP VP Percent: 1 %
Brady Statistic AP VS Percent: 1 %
Brady Statistic AS VP Percent: 1 %
Brady Statistic AS VS Percent: 99 %
Brady Statistic RA Percent Paced: 1 %
Brady Statistic RV Percent Paced: 1 %
Date Time Interrogation Session: 20250508020016
Implantable Lead Connection Status: 753985
Implantable Lead Connection Status: 753985
Implantable Lead Implant Date: 20211111
Implantable Lead Implant Date: 20211111
Implantable Lead Location: 753859
Implantable Lead Location: 753860
Implantable Pulse Generator Implant Date: 20211111
Lead Channel Impedance Value: 460 Ohm
Lead Channel Impedance Value: 580 Ohm
Lead Channel Pacing Threshold Amplitude: 0.5 V
Lead Channel Pacing Threshold Amplitude: 1 V
Lead Channel Pacing Threshold Pulse Width: 0.4 ms
Lead Channel Pacing Threshold Pulse Width: 0.4 ms
Lead Channel Sensing Intrinsic Amplitude: 1 mV
Lead Channel Sensing Intrinsic Amplitude: 10.5 mV
Lead Channel Setting Pacing Amplitude: 2 V
Lead Channel Setting Pacing Amplitude: 2.5 V
Lead Channel Setting Pacing Pulse Width: 0.4 ms
Lead Channel Setting Sensing Sensitivity: 2 mV
Pulse Gen Model: 2272
Pulse Gen Serial Number: 3859811

## 2023-10-02 ENCOUNTER — Encounter: Payer: Self-pay | Admitting: Cardiology

## 2023-10-04 DIAGNOSIS — M2041 Other hammer toe(s) (acquired), right foot: Secondary | ICD-10-CM | POA: Diagnosis not present

## 2023-10-04 DIAGNOSIS — L84 Corns and callosities: Secondary | ICD-10-CM | POA: Diagnosis not present

## 2023-10-04 DIAGNOSIS — L851 Acquired keratosis [keratoderma] palmaris et plantaris: Secondary | ICD-10-CM | POA: Diagnosis not present

## 2023-10-04 DIAGNOSIS — M2022 Hallux rigidus, left foot: Secondary | ICD-10-CM | POA: Diagnosis not present

## 2023-10-04 DIAGNOSIS — E1151 Type 2 diabetes mellitus with diabetic peripheral angiopathy without gangrene: Secondary | ICD-10-CM | POA: Diagnosis not present

## 2023-10-04 DIAGNOSIS — B351 Tinea unguium: Secondary | ICD-10-CM | POA: Diagnosis not present

## 2023-10-04 DIAGNOSIS — L603 Nail dystrophy: Secondary | ICD-10-CM | POA: Diagnosis not present

## 2023-10-04 DIAGNOSIS — I739 Peripheral vascular disease, unspecified: Secondary | ICD-10-CM | POA: Diagnosis not present

## 2023-10-04 DIAGNOSIS — M2021 Hallux rigidus, right foot: Secondary | ICD-10-CM | POA: Diagnosis not present

## 2023-10-05 LAB — HM DIABETES EYE EXAM

## 2023-10-06 DIAGNOSIS — H401231 Low-tension glaucoma, bilateral, mild stage: Secondary | ICD-10-CM | POA: Diagnosis not present

## 2023-10-06 DIAGNOSIS — H26493 Other secondary cataract, bilateral: Secondary | ICD-10-CM | POA: Diagnosis not present

## 2023-10-06 DIAGNOSIS — E119 Type 2 diabetes mellitus without complications: Secondary | ICD-10-CM | POA: Diagnosis not present

## 2023-10-06 DIAGNOSIS — H43813 Vitreous degeneration, bilateral: Secondary | ICD-10-CM | POA: Diagnosis not present

## 2023-10-06 DIAGNOSIS — H04123 Dry eye syndrome of bilateral lacrimal glands: Secondary | ICD-10-CM | POA: Diagnosis not present

## 2023-10-11 ENCOUNTER — Other Ambulatory Visit: Payer: Self-pay | Admitting: Internal Medicine

## 2023-10-11 ENCOUNTER — Encounter: Payer: Self-pay | Admitting: Internal Medicine

## 2023-10-11 ENCOUNTER — Ambulatory Visit (INDEPENDENT_AMBULATORY_CARE_PROVIDER_SITE_OTHER): Payer: Medicare Other | Admitting: Internal Medicine

## 2023-10-11 VITALS — BP 126/64 | HR 71 | Temp 98.1°F | Resp 16 | Ht 72.0 in | Wt 188.2 lb

## 2023-10-11 DIAGNOSIS — Z7984 Long term (current) use of oral hypoglycemic drugs: Secondary | ICD-10-CM | POA: Diagnosis not present

## 2023-10-11 DIAGNOSIS — E1140 Type 2 diabetes mellitus with diabetic neuropathy, unspecified: Secondary | ICD-10-CM | POA: Diagnosis not present

## 2023-10-11 DIAGNOSIS — I10 Essential (primary) hypertension: Secondary | ICD-10-CM | POA: Diagnosis not present

## 2023-10-11 DIAGNOSIS — E7800 Pure hypercholesterolemia, unspecified: Secondary | ICD-10-CM | POA: Diagnosis not present

## 2023-10-11 DIAGNOSIS — J61 Pneumoconiosis due to asbestos and other mineral fibers: Secondary | ICD-10-CM

## 2023-10-11 LAB — CBC WITH DIFFERENTIAL/PLATELET
Basophils Absolute: 0 10*3/uL (ref 0.0–0.1)
Basophils Relative: 0.6 % (ref 0.0–3.0)
Eosinophils Absolute: 0.2 10*3/uL (ref 0.0–0.7)
Eosinophils Relative: 3.2 % (ref 0.0–5.0)
HCT: 35.3 % — ABNORMAL LOW (ref 39.0–52.0)
Hemoglobin: 11.9 g/dL — ABNORMAL LOW (ref 13.0–17.0)
Lymphocytes Relative: 16.3 % (ref 12.0–46.0)
Lymphs Abs: 0.9 10*3/uL (ref 0.7–4.0)
MCHC: 33.7 g/dL (ref 30.0–36.0)
MCV: 95.4 fl (ref 78.0–100.0)
Monocytes Absolute: 0.5 10*3/uL (ref 0.1–1.0)
Monocytes Relative: 8.8 % (ref 3.0–12.0)
Neutro Abs: 4.1 10*3/uL (ref 1.4–7.7)
Neutrophils Relative %: 71.1 % (ref 43.0–77.0)
Platelets: 306 10*3/uL (ref 150.0–400.0)
RBC: 3.7 Mil/uL — ABNORMAL LOW (ref 4.22–5.81)
RDW: 13.8 % (ref 11.5–15.5)
WBC: 5.8 10*3/uL (ref 4.0–10.5)

## 2023-10-11 LAB — LIPID PANEL
Cholesterol: 106 mg/dL (ref 0–200)
HDL: 53.4 mg/dL (ref >39.00)
LDL Cholesterol: 36 mg/dL (ref 0–99)
NonHDL: 52.17
Total CHOL/HDL Ratio: 2
Triglycerides: 79 mg/dL (ref 0.0–149.0)
VLDL: 15.8 mg/dL (ref 0.0–40.0)

## 2023-10-11 LAB — HEMOGLOBIN A1C: Hgb A1c MFr Bld: 6.1 % (ref 4.6–6.5)

## 2023-10-11 LAB — BASIC METABOLIC PANEL WITH GFR
BUN: 22 mg/dL (ref 6–23)
CO2: 28 meq/L (ref 19–32)
Calcium: 9.4 mg/dL (ref 8.4–10.5)
Chloride: 101 meq/L (ref 96–112)
Creatinine, Ser: 0.91 mg/dL (ref 0.40–1.50)
GFR: 77.13 mL/min (ref >60.00)
Glucose, Bld: 91 mg/dL (ref 70–99)
Potassium: 4.1 meq/L (ref 3.5–5.1)
Sodium: 137 meq/L (ref 135–145)

## 2023-10-11 LAB — AST: AST: 22 U/L (ref 0–37)

## 2023-10-11 LAB — ALT: ALT: 15 U/L (ref 0–53)

## 2023-10-11 NOTE — Assessment & Plan Note (Signed)
 DM: Last A1c 6.3, currently on metformin -pioglitazone -Januvia .  Ambulatory CBGs on average-114.  Check A1c. HTN: Ambulatory BPs 129/66.  On Lasix , check a BMP and CBC. High cholesterol: On atorvastatin 20 mg.  LDL 63 last year.  Recheck FLP AST ALT. Asbestosis: Taking OFEV  since September 2024, since then is having consistent diarrhea described as loose stool and some weight loss. He is holding OFEV   since May 13 and diarrhea has stopped. Recommend to discuss with pulmonary. RTC 4 months

## 2023-10-11 NOTE — Patient Instructions (Signed)
  Continue checking your blood sugar and blood pressure regularly   Talk to your pulmonary doctors regards OFEV  and diarrhea  GO TO THE LAB :  Get the blood work   Your results will be posted on MyChart with my comments  Next office visit for a checkup in 4 months Please make an appointment before you leave today

## 2023-10-11 NOTE — Progress Notes (Signed)
 Subjective:    Patient ID: Chase Martinez, male    DOB: 27-Sep-1940, 83 y.o.   MRN: [id number]  DOS:  10/11/2023 Type of visit - description: Routine checkup  Chronic medical problems addressed. Started Ofev  for pulmonary fibrosis, having diarrhea since, no blood in the stools, + weight loss, stools are soft not watery. Denies chest pain or difficulty breathing. No major cough.  Wt Readings from Last 3 Encounters:  10/11/23 188 lb 4 oz (85.4 kg)  05/10/23 195 lb 8 oz (88.7 kg)  05/03/23 196 lb (88.9 kg)     Review of Systems See above   Past Medical History:  Diagnosis Date   Asbestosis (HCC)    Basal cell carcinoma    dr Rochelle Chu   Cataract Removed several years ago   Diabetes mellitus    Diverticulosis    Glaucoma    Hyperlipidemia    Hypertension    Hypertrophy of nail 12/2014   Onychogrphosis, Dr. Milderd Alken, diseased toenails 1-5 bilaterally   Internal hemorrhoids    Premature atrial contractions    Tubular adenoma of colon     Past Surgical History:  Procedure Laterality Date   CATARACT EXTRACTION Bilateral 05/24/2013   EYE SURGERY  Cataracts Removal   PACEMAKER IMPLANT N/A 04/03/2020   Procedure: PACEMAKER IMPLANT;  Surgeon: Boyce Byes, MD;  [facility];  Service: Cardiovascular;  Laterality: N/A;   PILONIDAL CYST EXCISION     REFRACTIVE SURGERY     SHOULDER SURGERY     right   TONSILLECTOMY      Current Outpatient Medications  Medication Instructions   Apoaequorin (PREVAGEN PO) Take by mouth.   atorvastatin (LIPITOR) 20 mg, Oral, Daily   Black Elderberry 1000 MG CAPS Take by mouth.   finasteride (PROSCAR) 5 mg, Daily   Fish Oil 1,000 mg, Daily   furosemide  (LASIX ) 20 mg, Oral, Daily   glucose blood (FREESTYLE LITE) test strip USE TO CHECK BLOOD SUGAR, NOT MORE THAN TWICE A DAY   Lancets (FREESTYLE) lancets Check blood sugars no more than twice daily   metFORMIN  (GLUCOPHAGE ) 1,000 mg, Oral, 2 times daily with meals   multivitamin  (THERAGRAN) per tablet 1 tablet, Daily   OFEV  150 MG CAPS Take 1 capsule twice daily (12 hours apart) with food.   pioglitazone  (ACTOS ) 30 mg, Oral, Daily   sitaGLIPtin  (JANUVIA ) 100 mg, Oral, Daily   tamsulosin  (FLOMAX ) 0.4 mg, 2 times daily   vitamin C 1,000 mg, Daily   Vitamin D3 2,000 Units, Daily       Objective:   Physical Exam BP 126/64   Pulse 71   Temp 98.1 F (36.7 C) (Oral)   Resp 16   Ht 6' (1.829 m)   Wt 188 lb 4 oz (85.4 kg)   SpO2 96%   BMI 25.53 kg/m  General:   Well developed, NAD, BMI noted. HEENT:  Normocephalic . Face symmetric, atraumatic Lungs:  ?  Velcro crackles, L base Normal respiratory effort, no intercostal retractions, no accessory muscle use. Heart: RRR,  no murmur.  Lower extremities: no pretibial edema bilaterally  Skin: Not pale. Not jaundice Neurologic:  alert & oriented X3.  Speech normal, gait appropriate for age and unassisted Psych--  Cognition and judgment appear intact.  Cooperative with normal attention span and concentration.  Behavior appropriate. No anxious or depressed appearing.      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 Dr. Rochelle Chu Nail dystrophy  Asbestosis dx 2019 Shingles 04-2020  PLAN: DM: Last A1c 6.3, currently on metformin -pioglitazone -Januvia .  Ambulatory CBGs on average-114.  Check A1c. HTN: Ambulatory BPs 129/66.  On Lasix , check a BMP and CBC. High cholesterol: On atorvastatin 20 mg.  LDL 63 last year.  Recheck FLP AST ALT. Asbestosis: Taking OFEV  since September 2024, since then is having consistent diarrhea described as loose stool and some weight loss. He is holding OFEV   since May 13 and diarrhea has stopped. Recommend to discuss with pulmonary. RTC 4 months

## 2023-10-12 ENCOUNTER — Ambulatory Visit: Payer: Self-pay | Admitting: Internal Medicine

## 2023-10-18 DIAGNOSIS — M2022 Hallux rigidus, left foot: Secondary | ICD-10-CM | POA: Diagnosis not present

## 2023-10-18 DIAGNOSIS — L84 Corns and callosities: Secondary | ICD-10-CM | POA: Diagnosis not present

## 2023-10-18 DIAGNOSIS — I739 Peripheral vascular disease, unspecified: Secondary | ICD-10-CM | POA: Diagnosis not present

## 2023-10-18 DIAGNOSIS — L603 Nail dystrophy: Secondary | ICD-10-CM | POA: Diagnosis not present

## 2023-10-18 DIAGNOSIS — M2041 Other hammer toe(s) (acquired), right foot: Secondary | ICD-10-CM | POA: Diagnosis not present

## 2023-10-18 DIAGNOSIS — B351 Tinea unguium: Secondary | ICD-10-CM | POA: Diagnosis not present

## 2023-10-18 DIAGNOSIS — L851 Acquired keratosis [keratoderma] palmaris et plantaris: Secondary | ICD-10-CM | POA: Diagnosis not present

## 2023-10-18 DIAGNOSIS — M2021 Hallux rigidus, right foot: Secondary | ICD-10-CM | POA: Diagnosis not present

## 2023-10-18 DIAGNOSIS — E1151 Type 2 diabetes mellitus with diabetic peripheral angiopathy without gangrene: Secondary | ICD-10-CM | POA: Diagnosis not present

## 2023-11-04 ENCOUNTER — Ambulatory Visit: Admitting: Pulmonary Disease

## 2023-11-07 ENCOUNTER — Other Ambulatory Visit: Payer: Self-pay | Admitting: Pulmonary Disease

## 2023-11-07 DIAGNOSIS — J849 Interstitial pulmonary disease, unspecified: Secondary | ICD-10-CM

## 2023-11-07 NOTE — Progress Notes (Signed)
 Remote pacemaker transmission.

## 2023-11-07 NOTE — Telephone Encounter (Signed)
 Refill sent for OFEV  to [facility] (pulmonary fibrosis team). [phone]  Dose: 150mg  twice daily  Last OV: 05/03/2023 Provider: Dr. Waylan Haggard Pertinent labs: AST/ALT on 10/11/2023 wnl  Next OV: 12/22/2023  Geraldene Kleine, PharmD, MPH, BCPS Clinical Pharmacist (Rheumatology and Pulmonology)

## 2023-11-08 DIAGNOSIS — L84 Corns and callosities: Secondary | ICD-10-CM | POA: Diagnosis not present

## 2023-11-08 DIAGNOSIS — I739 Peripheral vascular disease, unspecified: Secondary | ICD-10-CM | POA: Diagnosis not present

## 2023-11-08 DIAGNOSIS — L851 Acquired keratosis [keratoderma] palmaris et plantaris: Secondary | ICD-10-CM | POA: Diagnosis not present

## 2023-11-08 DIAGNOSIS — B351 Tinea unguium: Secondary | ICD-10-CM | POA: Diagnosis not present

## 2023-11-08 DIAGNOSIS — E1151 Type 2 diabetes mellitus with diabetic peripheral angiopathy without gangrene: Secondary | ICD-10-CM | POA: Diagnosis not present

## 2023-11-08 DIAGNOSIS — M2021 Hallux rigidus, right foot: Secondary | ICD-10-CM | POA: Diagnosis not present

## 2023-11-08 DIAGNOSIS — M2022 Hallux rigidus, left foot: Secondary | ICD-10-CM | POA: Diagnosis not present

## 2023-11-08 DIAGNOSIS — M2041 Other hammer toe(s) (acquired), right foot: Secondary | ICD-10-CM | POA: Diagnosis not present

## 2023-11-08 DIAGNOSIS — L603 Nail dystrophy: Secondary | ICD-10-CM | POA: Diagnosis not present

## 2023-11-09 ENCOUNTER — Other Ambulatory Visit: Payer: Self-pay | Admitting: Internal Medicine

## 2023-11-10 ENCOUNTER — Ambulatory Visit: Payer: Self-pay

## 2023-11-10 ENCOUNTER — Ambulatory Visit (HOSPITAL_BASED_OUTPATIENT_CLINIC_OR_DEPARTMENT_OTHER)
Admission: RE | Admit: 2023-11-10 | Discharge: 2023-11-10 | Disposition: A | Discharge status: Home / Self Care | Source: Ambulatory Visit | Attending: Family Medicine | Admitting: Family Medicine

## 2023-11-10 ENCOUNTER — Ambulatory Visit (INDEPENDENT_AMBULATORY_CARE_PROVIDER_SITE_OTHER): Admitting: Family Medicine

## 2023-11-10 VITALS — BP 139/67 | HR 66 | Ht 72.0 in | Wt 186.0 lb

## 2023-11-10 DIAGNOSIS — K4090 Unilateral inguinal hernia, without obstruction or gangrene, not specified as recurrent: Secondary | ICD-10-CM | POA: Insufficient documentation

## 2023-11-10 NOTE — Telephone Encounter (Signed)
 Copied from CRM [id number]. Topic: Clinical - Red Word Triage >> Nov 10, 2023 10:56 AM Allyne Areola wrote: Red Word that prompted transfer to Nurse Triage: Patient is calling because he noticed a possible groin hernia. Area is tender but no pain just discomfort.    Reason for Disposition  [1] New-onset hernia suspected (reducible bulge in groin or abdomen; non-tender) AND [2] NO pain or vomiting  Answer Assessment - Initial Assessment Questions 1. ONSET:  When did this first appear?     Yesterday  2. APPEARANCE: What does it look like?     Bulge in groin  3. SIZE: How big is it? (inches, cm or compare to coins, fruit)     3+ 4. LOCATION: Where exactly is the hernia located?     Groin  5. PATTERN: Does the swelling come and go, or has it been constant since it started?     Intermittent  6. PAIN: Is there any pain? If Yes, ask: How bad is it?  (Scale 1-10; or mild, moderate, severe)    - MILD (1-3): Doesn't interfere with normal activities, abdomen soft and not tender to touch.     - MODERATE (4-7): Interferes with normal activities or awakens from sleep, abdomen tender to touch.     - SEVERE (8-10): Excruciating pain, doubled over, unable to do any normal activities.       No pain  7. DIAGNOSIS: Have you been seen by a doctor (or NP/PA) for this? Did the doctor diagnose you as having a hernia?     No 8. OTHER SYMPTOMS: Do you have any other symptoms? (e.g., fever, abdomen pain, vomiting)     No  Protocols used: Hernia-A-AH    FYI Only or Action Required?: FYI only for provider.  Patient was last seen in primary care on 10/11/2023 by Ezell Hollow, MD. Called Nurse Triage reporting Groin Swelling. Symptoms began yesterday. Interventions attempted: Nothing. Symptoms are: unchanged.  Triage Disposition: See PCP When Office is Open (Within 3 Days)  Patient/caregiver understands and will follow disposition?: Yes

## 2023-11-10 NOTE — Progress Notes (Signed)
   Acute Office Visit  Subjective:     Patient ID: Chase Martinez, male    DOB: 08-05-1940, 83 y.o.   MRN: [id number]  Chief Complaint  Patient presents with   Groin Swelling    HPI Patient is in today for right groin pain.  Discussed the use of AI scribe software for clinical note transcription with the patient, who gave verbal consent to proceed.  History of Present Illness Chase Martinez is an 83 year old male who presents with a right groin bulge.  He noticed a bulge in his right groin area yesterday after working in the yard. The bulge feels like a 'big cyst' but is not painful. It appears when he changes positions, such as standing up, and seems to settle back when he sits down or rests. No soreness, tenderness, rashes, or skin changes are associated with the bulge.  No issues with urination, such as burning or blood in the urine, and no problems with his penis or scrotum.  His wife has cancer, and he is responsible for her care while his son and daughter-in-law are in [place].         ROS All review of systems negative except what is listed in the HPI      Objective:    BP 139/67   Pulse 66   Ht 6' (1.829 m)   Wt 186 lb (84.4 kg)   SpO2 100%   BMI 25.23 kg/m    Physical Exam Vitals reviewed.  Constitutional:      Appearance: Normal appearance.  Abdominal:     General: Abdomen is flat. There is no distension.     Tenderness: There is no abdominal tenderness. There is no guarding.     Comments: Right groin bulging, non-tender, no skin changes   Skin:    General: Skin is warm and dry.     Findings: No erythema.   Neurological:     Mental Status: He is alert and oriented to person, place, and time.   Psychiatric:        Behavior: Behavior normal.        Thought Content: Thought content normal.     No results found for any visits on 11/10/23.      Assessment & Plan:   Problem List Items Addressed This Visit   None Visit  Diagnoses       Right groin hernia    -  Primary   Relevant Orders   US  Pelvis Limited       Assessment & Plan  Suspected right inguinal hernia, asymptomatic without incarceration or strangulation. - Order ultrasound of the groin to confirm diagnosis. - Advise to avoid heavy lifting and strenuous activities. - Instruct to apply pressure to the area when changing positions. - Educated on signs of complications and advised to seek emergency care if he occurs.        No orders of the defined types were placed in this encounter.   Return if symptoms worsen or fail to improve.  Everlina Hock, NP

## 2023-11-14 ENCOUNTER — Telehealth: Payer: Self-pay

## 2023-11-14 NOTE — Telephone Encounter (Signed)
 Tried to call patient to let him know US  not yet read by radiology, number busy. Will try again later.

## 2023-11-14 NOTE — Telephone Encounter (Signed)
 Pt seen by [facility], results for ultrasound not back yet.

## 2023-11-14 NOTE — Telephone Encounter (Signed)
 Patient made aware results not received yet, will contact him once report has been read.

## 2023-11-14 NOTE — Telephone Encounter (Signed)
 Copied from CRM [id number]. Topic: Clinical - Lab/Test Results >> Nov 14, 2023 11:09 AM Macario HERO wrote: Reason for CRM: Patient is requesting a call back regarding imaging results. Call: [phone]

## 2023-11-16 ENCOUNTER — Encounter: Payer: Self-pay | Admitting: Internal Medicine

## 2023-11-16 NOTE — Telephone Encounter (Signed)
 Awaiting results

## 2023-11-16 NOTE — Telephone Encounter (Signed)
 Copied from CRM [id number]. Topic: Clinical - Lab/Test Results >> Nov 16, 2023 10:11 AM Chase Martinez wrote: Reason for CRM: Patient called in requesting imaging results advised results have not been received and will be contacted once they have. He stated he is getting concerned and it shouldn't take this long. 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 Would like to be reached at [phone]

## 2023-11-17 ENCOUNTER — Ambulatory Visit: Payer: Medicare Other | Admitting: *Deleted

## 2023-11-17 VITALS — Ht 72.0 in | Wt 186.0 lb

## 2023-11-17 DIAGNOSIS — Z0000 Encounter for general adult medical examination without abnormal findings: Secondary | ICD-10-CM | POA: Diagnosis not present

## 2023-11-17 NOTE — Telephone Encounter (Signed)
 I did already call to let him know that it hasn't been read and radiology sometimes takes awhile to read, let him know once we get results.

## 2023-11-17 NOTE — Progress Notes (Addendum)
 Please attest this visit in the absence of patient primary care provider.    Subjective:   Chase Martinez is a 83 y.o. who presents for a Medicare Wellness preventive visit.  As a reminder, Annual Wellness Visits don't include a physical exam, and some assessments may be limited, especially if this visit is performed virtually. We may recommend an in-person follow-up visit with your provider if needed.  Visit Complete: Virtual I connected with  [name] on 11/17/23 by a audio enabled telemedicine application and verified that I am speaking with the correct person using two identifiers.  Patient Location: Home  Provider Location: Office/Clinic  I discussed the limitations of evaluation and management by telemedicine. The patient expressed understanding and agreed to proceed.  Vital Signs: Because this visit was a virtual/telehealth visit, some criteria may be missing or patient reported. Any vitals not documented were not able to be obtained and vitals that have been documented are patient reported.  VideoDeclined- This patient declined Librarian, academic. Therefore the visit was completed with audio only.  Persons Participating in Visit: Patient.  AWV Questionnaire: Yes: Patient Medicare AWV questionnaire was completed by the patient on 11/10/23; I have confirmed that all information answered by patient is correct and no changes since this date.  Cardiac Risk Factors include: advanced age (>78men, >53 women);dyslipidemia;hypertension;male gender;diabetes mellitus;Other (see comment), Risk factor comments: PAC, basal cell carcinoma     Objective:    Today's Vitals   11/17/23 0816  Weight: 186 lb (84.4 kg)  Height: 6' (1.829 m)   Body mass index is 25.23 kg/m.     11/17/2023    8:50 AM 11/16/2022    8:16 AM 10/21/2022    2:17 PM 11/10/2021    8:27 AM 11/04/2020   10:59 AM 04/03/2020    8:46 AM 03/13/2019    3:19 PM  Advanced Directives   Does Patient Have a Medical Advance Directive? Yes Yes No Yes Yes Yes Yes  Type of Estate agent of [place];Living will Healthcare Power of [place];Living will  Living will;Healthcare Power of [employer] Power of [place];Living will Healthcare Power of [place];Living will Healthcare Power of [place];Living will  Does patient want to make changes to medical advance directive? No - Patient declined No - Patient declined  No - Patient declined   No - Patient declined  Copy of Healthcare Power of Attorney in Chart? Yes - validated most recent copy scanned in chart (See row information) Yes - validated most recent copy scanned in chart (See row information)  No - copy requested No - copy requested  Yes - validated most recent copy scanned in chart (See row information)    Current Medications (verified) Outpatient Encounter Medications as of 11/17/2023  Medication Sig   Apoaequorin (PREVAGEN PO) Take by mouth.   Ascorbic Acid (VITAMIN C) 1000 MG tablet Take 1,000 mg by mouth daily.    atorvastatin (LIPITOR) 20 MG tablet Take 1 tablet (20 mg total) by mouth daily.   Black Elderberry 1000 MG CAPS Take by mouth.   Cholecalciferol (VITAMIN D3) 50 MCG (2000 UT) capsule Take 2,000 Units by mouth daily.    finasteride (PROSCAR) 5 MG tablet Take 5 mg by mouth daily.   furosemide  (LASIX ) 20 MG tablet Take 1 tablet (20 mg total) by mouth daily.   glucose blood (FREESTYLE LITE) test strip USE TO CHECK BLOOD SUGAR, NOT MORE THAN TWICE A DAY   Lancets (FREESTYLE) lancets Check blood sugars  no more than twice daily   metFORMIN  (GLUCOPHAGE ) 1000 MG tablet Take 1 tablet (1,000 mg total) by mouth 2 (two) times daily with a meal.   multivitamin (THERAGRAN) per tablet Take 1 tablet by mouth daily.   OFEV  150 MG CAPS TAKE 1 CAPSULE TWICE DAILY (12 HOURS APART) WITH FOOD.   Omega-3 Fatty Acids (FISH OIL) 1000 MG CAPS Take 1,000 mg by mouth daily.   pioglitazone  (ACTOS ) 30 MG tablet  Take 1 tablet (30 mg total) by mouth daily.   sitaGLIPtin  (JANUVIA ) 100 MG tablet Take 1 tablet (100 mg total) by mouth daily.   tamsulosin  (FLOMAX ) 0.4 MG CAPS capsule Take 1 capsule (0.4 mg total) by mouth in the morning and at bedtime.   No facility-administered encounter medications on file as of 11/17/2023.    Allergies (verified) Patient has no known allergies.   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   Procedure: PACEMAKER IMPLANT;  Surgeon: Cindie Ole DASEN, MD;  Location: [facility];  Service: Cardiovascular;  Laterality: N/A;   PILONIDAL CYST EXCISION     REFRACTIVE SURGERY     SHOULDER SURGERY     right   TONSILLECTOMY     Family History  Problem Relation Age of Onset   Hypertension Mother    Glaucoma Mother    Dementia Father    Hearing loss Father    Parkinsonism Sister    Thyroid  cancer Son    Coronary artery disease Neg Hx    Diabetes Neg Hx    Stroke Neg Hx    Prostate cancer Neg Hx    Colon cancer Neg Hx    Esophageal cancer Neg Hx    Stomach cancer Neg Hx    Social History   Socioeconomic History   Marital status: Married    Spouse name: Not on file   Number of children: 2   Years of education: Not on file   Highest education level: 12th grade  Occupational History   Occupation: Retired Cabin crew Academic librarian)  Tobacco Use   Smoking status: Former    Current packs/day: 0.00    Average packs/day: 0.5 packs/day for  10.0 years (5.0 ttl pk-yrs)    Types: Cigarettes, Cigars    Start date: 05/24/1956    Quit date: 05/24/1966    Years since quitting: 57.5   Smokeless tobacco: Former    Types: Snuff    Quit date: 05/24/1988  Vaping Use   Vaping status: Never Used  Substance and Sexual Activity   Alcohol use: Yes    Alcohol/week: 8.0 standard drinks of alcohol    Types: 8 Glasses of wine per week    Comment: Glass of wine at dinner & minimal social drinking   Drug use: Never   Sexual activity: Not Currently  Other Topics Concern   Not on file  Social History Narrative   Household-- pt and  wife    Son lives near by, daughter near [place]   Social Drivers of Health   Financial Resource Strain: Low Risk  (11/10/2023)   Overall Financial Resource Strain (CARDIA)    Difficulty of Paying Living Expenses: Not hard at all  Food Insecurity: No Food Insecurity (11/10/2023)   Hunger Vital Sign    Worried About Running Out of Food in the Last Year: Never true    Ran Out of Food in the Last Year: Never true  Transportation Needs: No Transportation Needs (11/10/2023)   PRAPARE - Administrator, Civil Service (Medical): No    Lack of Transportation (Non-Medical): No  Physical Activity: Sufficiently Active (11/10/2023)   Exercise Vital Sign    Days of Exercise per Week: 3 days    Minutes of Exercise per Session: 150+ min  Stress: No Stress Concern Present (11/10/2023)   [employer] of Occupational Health - Occupational Stress Questionnaire    Feeling of Stress: Only a little  Social Connections: Socially Integrated (11/10/2023)   Social Connection and Isolation Panel    Frequency of Communication with Friends and Family: More than three times a week    Frequency of Social Gatherings with Friends and Family: More than three times a week    Attends Religious Services: More than 4 times per year    Active Member of [employer] or Organizations: Yes    Attends Engineer, structural: More than  4 times per year    Marital Status: Married    Tobacco Counseling Counseling given: Not Answered    Clinical Intake:  Pre-visit preparation completed: Yes        BMI - recorded: 25.23 Nutritional Risks: None Diabetes: Yes CBG done?: No  Lab Results  Component Value Date   HGBA1C 6.1 10/11/2023   HGBA1C 6.3 05/10/2023   HGBA1C 6.5 12/07/2022     How often do you need to have someone help you when you read instructions, pamphlets, or other written materials from your doctor or pharmacy?: 1 - Never What is the last grade level you completed in school?: 12th  Interpreter Needed?: No  Information entered by :: Lolita Libra, CMA   Activities of Daily Living     11/10/2023    8:52 AM  In your present state of health, do you have any difficulty performing the following activities:  Hearing? 0  Vision? 0  Difficulty concentrating or making decisions? 0  Walking or climbing stairs? 0  Dressing or bathing? 0  Doing errands, shopping? 0  Preparing Food and eating ? N  Using the Toilet? N  In the past six months, have you accidently leaked urine? N  Do you have problems with loss of bowel control? N  Managing your Medications? N  Managing your Finances? N  Housekeeping or managing your Housekeeping? N    Patient Care Team: Amon Aloysius BRAVO, MD as PCP - General Shlomo Wilbert SAUNDERS, MD as PCP - Cardiology (Cardiology) Cindie Ole DASEN, MD as PCP - Electrophysiology (Cardiology) Zan Factor, DPM as Consulting Physician (Podiatry) Patrcia Sharper, MD as Consulting Physician (Ophthalmology)  I have updated your Care Teams any recent Medical Services you may have received from other providers in the past year.     Assessment:   This is a routine wellness examination for [place].  Hearing/Vision screen Hearing Screening - Comments:: Has tinnitus Vision Screening - Comments:: up to date with routine eye exams @ [facility].   Goals Addressed  None     Depression Screen     11/17/2023    8:36 AM 10/11/2023    8:31 AM 05/10/2023    8:53 AM 03/22/2023   10:57 AM 12/07/2022    8:16 AM 11/16/2022    8:24 AM 06/08/2022    7:59 AM  PHQ 2/9 Scores  PHQ - 2 Score 0 0 0 0 0 0 0    Fall Risk     11/10/2023    8:52 AM 10/11/2023    8:31 AM 05/10/2023    8:53 AM 05/03/2023    1:37 PM 03/22/2023   10:58 AM  Fall Risk   Falls in the past year? 1 0 0 0 0  Number falls in past yr: 0 0 0 0 0  Injury with Fall? 0 0 0 0 0  Risk for fall due to : Impaired vision;Impaired balance/gait      Follow up Education provided Falls evaluation completed;Education provided Falls evaluation completed;Education provided  Falls evaluation completed    MEDICARE RISK AT HOME:  Medicare Risk at Home Any stairs in or around the home?: (Patient-Rptd) Yes If so, are there any without handrails?: (Patient-Rptd) No Home free of loose throw rugs in walkways, pet beds, electrical cords, etc?: (Patient-Rptd) Yes Adequate lighting in your home to reduce risk of falls?: (Patient-Rptd) Yes Life alert?: (Patient-Rptd) No Use of a cane, walker or w/c?: (Patient-Rptd) No Grab bars in the bathroom?: (Patient-Rptd) Yes Shower chair or bench in shower?: (Patient-Rptd) Yes Elevated toilet seat or a handicapped toilet?: (Patient-Rptd) No  TIMED UP AND GO:  Was the test performed?  No,audio  Cognitive Function: 6CIT completed        11/17/2023    8:40 AM 11/16/2022    8:26 AM 11/10/2021    8:35 AM  6CIT Screen  What Year? 0 points 0 points 0 points  What month? 0 points 0 points 0 points  What time? 0 points 0 points 0 points  Count back from 20 2 points 0 points 0 points  Months in reverse 0 points 0 points 0 points  Repeat phrase 0 points 0 points 0 points  Total Score 2 points 0 points 0 points    Immunizations Immunization History  Administered Date(s) Administered   DTaP 06/21/2011   Fluad Quad(high Dose 65+) 02/28/2020   Fluad Trivalent(High Dose 65+)  01/25/2023   H1N1 06/11/2008   Hepatitis A 12/23/2015   Influenza Split 02/22/2012, 02/23/2022   Influenza Whole 04/05/2007, 02/13/2008, 02/18/2009, 01/20/2010   Influenza, High Dose Seasonal PF 04/04/2013, 03/02/2017, 03/09/2018, 01/06/2019   Influenza,inj,Quad PF,6+ Mos 03/07/2014   Influenza-Unspecified 03/06/2015, 02/20/2016, 02/03/2021   PFIZER(Purple Top)SARS-COV-2 Vaccination 06/05/2019, 06/25/2019, 02/18/2020, 08/22/2020, 03/10/2023   PNEUMOCOCCAL CONJUGATE-20 06/08/2022   Pfizer Covid-19 Vaccine Bivalent Booster 53yrs & up 02/03/2021, 12/03/2021   Pfizer(Comirnaty)Fall Seasonal Vaccine 12 years and older 02/23/2022   Pneumococcal Conjugate-13 10/30/2013   Pneumococcal Polysaccharide-23 05/24/2002, 09/28/2007, 08/05/2016   Respiratory Syncytial Virus Vaccine,Recomb Aduvanted(Arexvy) 03/08/2022   Td 05/24/2001, 05/21/2013   Tdap 12/03/2021   Typhoid Inactivated 12/23/2015   Yellow Fever 12/23/2015   Zoster Recombinant(Shingrix) 03/21/2018, 05/25/2018   Zoster, Live 05/24/2002    Screening Tests Health Maintenance  Topic Date Due   Medicare Annual Wellness (AWV)  11/16/2023   COVID-19 Vaccine (9 - 2024-25 season) 11/08/2024 (Originally 05/05/2023)   INFLUENZA VACCINE  12/23/2023   FOOT EXAM  03/21/2024   HEMOGLOBIN A1C  04/12/2024   Diabetic kidney evaluation - Urine ACR  05/09/2024   OPHTHALMOLOGY  EXAM  10/04/2024   Diabetic kidney evaluation - eGFR measurement  10/10/2024   DTaP/Tdap/Td (5 - Td or Tdap) 12/04/2031   Pneumococcal Vaccine: 50+ Years  Completed   Zoster Vaccines- Shingrix  Completed   Hepatitis B Vaccines  Aged Out   HPV VACCINES  Aged Out   Meningococcal B Vaccine  Aged Out    Health Maintenance  Health Maintenance Due  Topic Date Due   Medicare Annual Wellness (AWV)  11/16/2023   Health Maintenance Items Addressed: All HM up to date  Additional Screening:  Vision Screening: Recommended annual ophthalmology exams for early detection of  glaucoma and other disorders of the eye. Would you like a referral to an eye doctor? Yes    Dental Screening: Recommended annual dental exams for proper oral hygiene  Community Resource Referral / Chronic Care Management: CRR required this visit?  No   CCM required this visit?  No   Plan:    I have personally reviewed and noted the following in the patient's chart:   Medical and social history Use of alcohol, tobacco or illicit drugs  Current medications and supplements including opioid prescriptions. Patient is not currently taking opioid prescriptions. Functional ability and status Nutritional status Physical activity Advanced directives List of other physicians Hospitalizations, surgeries, and ER visits in previous 12 months Vitals Screenings to include cognitive, depression, and falls Referrals and appointments  In addition, I have reviewed and discussed with patient certain preventive protocols, quality metrics, and best practice recommendations. A written personalized care plan for preventive services as well as general preventive health recommendations were provided to patient.   Lolita Libra, CMA   11/17/2023   After Visit Summary: (MyChart) Due to this being a telephonic visit, the after visit summary with patients personalized plan was offered to patient via MyChart   Notes: Nothing significant to report at this time.

## 2023-11-17 NOTE — Patient Instructions (Addendum)
 Chase Martinez , Thank you for taking time out of your busy schedule to complete your Annual Wellness Visit with me. I enjoyed our conversation and look forward to speaking with you again next year. I, as well as your care team,  appreciate your ongoing commitment to your health goals. Please review the following plan we discussed and let me know if I can assist you in the future. Your Game plan/ To Do List    Follow up Visits: Next Medicare AWV with our clinical staff: 11/22/24 8:20am   Next Office Visit with your provider: 02/14/24 8:20am  Clinician Recommendations:  Aim for 30 minutes of exercise or brisk walking, 6-8 glasses of water, and 5 servings of fruits and vegetables each day.       This is a list of the screening recommended for you and due dates:  Health Maintenance  Topic Date Due   Medicare Annual Wellness Visit  11/16/2023   COVID-19 Vaccine (9 - 2024-25 season) 11/08/2024*   Flu Shot  12/23/2023   Complete foot exam   03/21/2024   Hemoglobin A1C  04/12/2024   Yearly kidney health urinalysis for diabetes  05/09/2024   Eye exam for diabetics  10/04/2024   Yearly kidney function blood test for diabetes  10/10/2024   DTaP/Tdap/Td vaccine (5 - Td or Tdap) 12/04/2031   Pneumococcal Vaccine for age over 46  Completed   Zoster (Shingles) Vaccine  Completed   Hepatitis B Vaccine  Aged Out   HPV Vaccine  Aged Out   Meningitis B Vaccine  Aged Out  *Topic was postponed. The date shown is not the original due date.    See attachments for Preventive Care Tips.

## 2023-11-18 ENCOUNTER — Ambulatory Visit: Payer: Self-pay | Admitting: Family Medicine

## 2023-11-18 DIAGNOSIS — K4090 Unilateral inguinal hernia, without obstruction or gangrene, not specified as recurrent: Secondary | ICD-10-CM

## 2023-11-18 NOTE — Telephone Encounter (Signed)
 Report received and results in result note. Will document further in that encounter.

## 2023-12-02 ENCOUNTER — Ambulatory Visit: Payer: Self-pay | Admitting: Surgery

## 2023-12-02 ENCOUNTER — Encounter (HOSPITAL_COMMUNITY): Payer: Self-pay | Admitting: Surgery

## 2023-12-02 DIAGNOSIS — K4090 Unilateral inguinal hernia, without obstruction or gangrene, not specified as recurrent: Secondary | ICD-10-CM | POA: Diagnosis not present

## 2023-12-02 NOTE — Progress Notes (Signed)
 For Anesthesia: PCP - Aloysius FORBES Mech, MD  Cardiologist - Wilbert Bihari, MD  EPS-Lambert, Ole DASEN, MD   Bowel Prep reminder:  Chest x-ray - greater than 1 year sin [facility] EKG -  Stress Test -  ECHO - 03/31/20 in [facility] -  Pacemaker/ICD device last checked: 09/29/23 in [facility] Pacemaker orders received: Device Rep notified:  Spinal Cord Stimulator:  Sleep Study -  CPAP -   Fasting Blood Sugar -  Checks Blood Sugar _____ times a day Date and result of last Hgb A1c-  Last dose of GLP1 agonist-  GLP1 instructions:   Last dose of SGLT-2 inhibitors-  SGLT-2 instructions:   Blood Thinner Instructions: Aspirin Instructions: Last Dose:  Activity level: Can go up a flight of stairs and activities of daily living without stopping and without chest pain and/or shortness of breath   Able to exercise without chest pain and/or shortness of breath   Unable to go up a flight of stairs without chest pain and/or shortness of breath     Anesthesia review:   Patient denies shortness of breath, fever, cough and chest pain at PAT appointment   Patient verbalized understanding of instructions that were reviewed over the telephone.

## 2023-12-02 NOTE — H&P (View-Only) (Signed)
 Chase Martinez [id number]   Referring Provider:  Almarie Waddell Benders, NP   Subjective   Chief Complaint: New Consultation     History of Present Illness:    Very pleasant 83 year old man with history of asbestosis, diabetes, diverticulosis, hypertension, hyperlipidemia, PACs/pacemaker and no previous abdominal surgery who presents for evaluation of a right inguinal hernia.  He noticed a bulge in the right groin about a month ago after working in the yard.  Not particularly painful.  Reduces when he sits down or rests.  No associated GI or urinary symptoms.  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  Last week she actually went into hospice.  His son lives here in town.  The patient is fairly active, does a fair bit of walking as well as golfing.  Recent labs were done in May and notable for hemoglobin of 11.9 hemoglobin A1c of 6.1 but otherwise BMP, lipid panel, CBC, unremarkable.  He also had a pelvic ultrasound about 2 weeks ago which demonstrates a small bowel containing right inguinal hernia.  Review of Systems: A complete review of systems was obtained from the patient.  I have reviewed this information and discussed as appropriate with the patient.  See HPI as well for other ROS.   Medical History: Past Medical History:  Diagnosis Date   Diabetes mellitus without complication (CMS/HHS-HCC)     There is no problem list on file for this patient.   Past Surgical History:  Procedure Laterality Date   EXCISION PILONIDAL CYST/SINUS     REPOSITIONING PREVIOUSLY IMPLANTED TRANSVENOUS PACEMAKER OR ICD ELECTRODE     Shoulder surgery     TONSILLECTOMY       No Known Allergies  Current Outpatient Medications on File Prior to Visit  Medication Sig Dispense Refill   ascorbic acid (VITA-C ORAL) Take by mouth     atorvastatin (LIPITOR) 20 MG tablet Take 20 mg by mouth once daily     cholecalciferol,  vitamin D3, (VITAMIN D3 ORAL) Take by mouth     ELDERBERRY FRUIT ORAL Take by mouth     finasteride (PROSCAR) 5 mg tablet      FREESTYLE LANCETS lancets      FUROsemide  (LASIX ) 20 MG tablet      metFORMIN  (GLUCOPHAGE ) 1000 MG tablet Take 1,000 mg by mouth 2 (two) times daily with meals     multivitamin tablet Take 1 tablet by mouth once daily     OFEV  150 mg Cap TAKE 1 CAPSULE TWICE DAILY (12 HOURS APART) WITH FOOD.     omega-3 fatty acids/fish oil (FISH OIL) 340-1,000 mg capsule Take 1 capsule by mouth once daily     pioglitazone  (ACTOS ) 30 MG tablet Take 30 mg by mouth once daily     SITagliptin  phosphate (JANUVIA ) 100 MG tablet Take 100 mg by mouth once daily     tamsulosin  (FLOMAX ) 0.4 mg capsule      No current facility-administered medications on file prior to visit.    Family History  Problem Relation Age of Onset   High blood pressure (Hypertension) Mother    Hyperlipidemia (Elevated cholesterol) Mother      Social History   Tobacco Use  Smoking Status Former   Types: Cigarettes   Start date: 1968  Smokeless Tobacco Never     Social History   Socioeconomic History   Marital status: Married  Tobacco Use   Smoking status: Former  Types: Cigarettes    Start date: 1968   Smokeless tobacco: Never  Substance and Sexual Activity   Alcohol use: Yes    Alcohol/week: 0.0 - 1.0 standard drinks of alcohol   Drug use: Never   Social Drivers of Corporate investment banker Strain: Low Risk  (11/10/2023)   Received from [facility]   Overall Financial Resource Strain (CARDIA)    How hard is it for you to pay for the very basics like food, housing, medical care, and heating?: Not hard at all  Food Insecurity: No Food Insecurity (11/10/2023)   Received from [facility]   Hunger Vital Sign    Within the past 12 months, you worried that your food would run out before you got the money to buy more.: Never true    Within the past 12 months, the food you bought just didn't  last and you didn't have money to get more.: Never true  Transportation Needs: No Transportation Needs (11/10/2023)   Received from [facility] - Transportation    In the past 12 months, has lack of transportation kept you from medical appointments or from getting medications?: No    In the past 12 months, has lack of transportation kept you from meetings, work, or from getting things needed for daily living?: No  Physical Activity: Sufficiently Active (11/10/2023)   Received from [facility]   Exercise Vital Sign    On average, how many days per week do you engage in moderate to strenuous exercise (like a brisk walk)?: 3 days    On average, how many minutes do you engage in exercise at this level?: 150+ min  Stress: No Stress Concern Present (11/10/2023)   Received from [facility] - Occupational Stress Questionnaire    Do you feel stress - tense, restless, nervous, or anxious, or unable to sleep at night because your mind is troubled all the time - these days?: Only a little  Social Connections: Socially Integrated (11/10/2023)   Received from [facility]   Social Connection and Isolation Panel    In a typical week, how many times do you talk on the phone with family, friends, or neighbors?: More than three times a week    How often do you get together with friends or relatives?: More than three times a week    How often do you attend church or religious services?: More than 4 times per year    Do you belong to any clubs or organizations such as church groups, unions, fraternal or athletic groups, or school groups?: Yes    How often do you attend meetings of the clubs or organizations you belong to?: More than 4 times per year    Are you married, widowed, divorced, separated, never married, or living with a partner?: Married  Housing Stability: Unknown (12/02/2023)   Housing Stability Vital Sign    Homeless in the Last Year: No     Objective:    Vitals:   12/02/23 1034 12/02/23 1035  BP: 138/70   Pulse: 84   Temp: 36.5 C (97.7 F)   SpO2: 98%   Weight: 84.7 kg (186 lb 12.8 oz)   Height: 182.9 cm (6')   PainSc:  0-No pain  PainLoc:  Abdomen    Body mass index is 25.33 kg/m.  Gen: A&Ox3, no distress  Chest: respiratory effort is normal. Abdomen: soft, nondistended, nontender.  Small right inguinal hernia with  the Salva.  No hernia on the left. Neuro: no gross deficit Psych: appropriate mood and affect, normal insight/judgment intact  Skin: warm and dry   Assessment and Plan:  Diagnoses and all orders for this visit:  Non-recurrent unilateral inguinal hernia without obstruction or gangrene  We discussed the relevant anatomy and we discussed options for repair.  I recommend an open approach and went over the technique of the procedure.  Discussed risks of bleeding, infection, pain, scarring, injury to structures in the area including nerves, blood vessels, bowel, or bladder; risk of chronic pain, hernia recurrence, risk of seroma or hematoma, urinary retention, and risks of general anesthesia including cardiovascular, pulmonary, and thromboembolic complications.  We discussed typical postop recovery, timeline, and activity limitations.  We also discussed the option of ongoing observation, with high rate of ultimately returning for surgery and risk of increasing size/symptoms from the hernia as well as incarceration/strangulation and went over symptoms that should prompt the patient to seek emergency treatment.  Questions were welcomed and answered to the patient's satisfaction. Patient wishes to proceed with scheduling.     Mitzie Freund MD FACS

## 2023-12-02 NOTE — H&P (Signed)
 Chase Martinez [id number]   Referring Provider:  Almarie Waddell Benders, NP   Subjective   Chief Complaint: New Consultation     History of Present Illness:    Very pleasant 83 year old man with history of asbestosis, diabetes, diverticulosis, hypertension, hyperlipidemia, PACs/pacemaker and no previous abdominal surgery who presents for evaluation of a right inguinal hernia.  He noticed a bulge in the right groin about a month ago after working in the yard.  Not particularly painful.  Reduces when he sits down or rests.  No associated GI or urinary symptoms.  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  Last week she actually went into hospice.  His son lives here in town.  The patient is fairly active, does a fair bit of walking as well as golfing.  Recent labs were done in May and notable for hemoglobin of 11.9 hemoglobin A1c of 6.1 but otherwise BMP, lipid panel, CBC, unremarkable.  He also had a pelvic ultrasound about 2 weeks ago which demonstrates a small bowel containing right inguinal hernia.  Review of Systems: A complete review of systems was obtained from the patient.  I have reviewed this information and discussed as appropriate with the patient.  See HPI as well for other ROS.   Medical History: Past Medical History:  Diagnosis Date   Diabetes mellitus without complication (CMS/HHS-HCC)     There is no problem list on file for this patient.   Past Surgical History:  Procedure Laterality Date   EXCISION PILONIDAL CYST/SINUS     REPOSITIONING PREVIOUSLY IMPLANTED TRANSVENOUS PACEMAKER OR ICD ELECTRODE     Shoulder surgery     TONSILLECTOMY       No Known Allergies  Current Outpatient Medications on File Prior to Visit  Medication Sig Dispense Refill   ascorbic acid (VITA-C ORAL) Take by mouth     atorvastatin (LIPITOR) 20 MG tablet Take 20 mg by mouth once daily     cholecalciferol,  vitamin D3, (VITAMIN D3 ORAL) Take by mouth     ELDERBERRY FRUIT ORAL Take by mouth     finasteride (PROSCAR) 5 mg tablet      FREESTYLE LANCETS lancets      FUROsemide  (LASIX ) 20 MG tablet      metFORMIN  (GLUCOPHAGE ) 1000 MG tablet Take 1,000 mg by mouth 2 (two) times daily with meals     multivitamin tablet Take 1 tablet by mouth once daily     OFEV  150 mg Cap TAKE 1 CAPSULE TWICE DAILY (12 HOURS APART) WITH FOOD.     omega-3 fatty acids/fish oil (FISH OIL) 340-1,000 mg capsule Take 1 capsule by mouth once daily     pioglitazone  (ACTOS ) 30 MG tablet Take 30 mg by mouth once daily     SITagliptin  phosphate (JANUVIA ) 100 MG tablet Take 100 mg by mouth once daily     tamsulosin  (FLOMAX ) 0.4 mg capsule      No current facility-administered medications on file prior to visit.    Family History  Problem Relation Age of Onset   High blood pressure (Hypertension) Mother    Hyperlipidemia (Elevated cholesterol) Mother      Social History   Tobacco Use  Smoking Status Former   Types: Cigarettes   Start date: 1968  Smokeless Tobacco Never     Social History   Socioeconomic History   Marital status: Married  Tobacco Use   Smoking status: Former  Types: Cigarettes    Start date: 1968   Smokeless tobacco: Never  Substance and Sexual Activity   Alcohol use: Yes    Alcohol/week: 0.0 - 1.0 standard drinks of alcohol   Drug use: Never   Social Drivers of Corporate investment banker Strain: Low Risk  (11/10/2023)   Received from [facility]   Overall Financial Resource Strain (CARDIA)    How hard is it for you to pay for the very basics like food, housing, medical care, and heating?: Not hard at all  Food Insecurity: No Food Insecurity (11/10/2023)   Received from [facility]   Hunger Vital Sign    Within the past 12 months, you worried that your food would run out before you got the money to buy more.: Never true    Within the past 12 months, the food you bought just didn't  last and you didn't have money to get more.: Never true  Transportation Needs: No Transportation Needs (11/10/2023)   Received from [facility]    In the past 12 months, has lack of transportation kept you from medical appointments or from getting medications?: No    In the past 12 months, has lack of transportation kept you from meetings, work, or from getting things needed for daily living?: No  Physical Activity: Sufficiently Active (11/10/2023)   Received from [facility]   Exercise Vital Sign    On average, how many days per week do you engage in moderate to strenuous exercise (like a brisk walk)?: 3 days    On average, how many minutes do you engage in exercise at this level?: 150+ min  Stress: No Stress Concern Present (11/10/2023)   Received from [facility] - Occupational Stress Questionnaire    Do you feel stress - tense, restless, nervous, or anxious, or unable to sleep at night because your mind is troubled all the time - these days?: Only a little  Social Connections: Socially Integrated (11/10/2023)   Received from [facility]   Social Connection and Isolation Panel    In a typical week, how many times do you talk on the phone with family, friends, or neighbors?: More than three times a week    How often do you get together with friends or relatives?: More than three times a week    How often do you attend church or religious services?: More than 4 times per year    Do you belong to any clubs or organizations such as church groups, unions, fraternal or athletic groups, or school groups?: Yes    How often do you attend meetings of the clubs or organizations you belong to?: More than 4 times per year    Are you married, widowed, divorced, separated, never married, or living with a partner?: Married  Housing Stability: Unknown (12/02/2023)   Housing Stability Vital Sign    Homeless in the Last Year: No     Objective:    Vitals:   12/02/23 1034 12/02/23 1035  BP: 138/70   Pulse: 84   Temp: 36.5 C (97.7 F)   SpO2: 98%   Weight: 84.7 kg (186 lb 12.8 oz)   Height: 182.9 cm (6')   PainSc:  0-No pain  PainLoc:  Abdomen    Body mass index is 25.33 kg/m.  Gen: A&Ox3, no distress  Chest: respiratory effort is normal. Abdomen: soft, nondistended, nontender.  Small right inguinal hernia with  the Salva.  No hernia on the left. Neuro: no gross deficit Psych: appropriate mood and affect, normal insight/judgment intact  Skin: warm and dry   Assessment and Plan:  Diagnoses and all orders for this visit:  Non-recurrent unilateral inguinal hernia without obstruction or gangrene  We discussed the relevant anatomy and we discussed options for repair.  I recommend an open approach and went over the technique of the procedure.  Discussed risks of bleeding, infection, pain, scarring, injury to structures in the area including nerves, blood vessels, bowel, or bladder; risk of chronic pain, hernia recurrence, risk of seroma or hematoma, urinary retention, and risks of general anesthesia including cardiovascular, pulmonary, and thromboembolic complications.  We discussed typical postop recovery, timeline, and activity limitations.  We also discussed the option of ongoing observation, with high rate of ultimately returning for surgery and risk of increasing size/symptoms from the hernia as well as incarceration/strangulation and went over symptoms that should prompt the patient to seek emergency treatment.  Questions were welcomed and answered to the patient's satisfaction. Patient wishes to proceed with scheduling.     Mitzie Freund MD FACS

## 2023-12-05 ENCOUNTER — Other Ambulatory Visit: Payer: Self-pay

## 2023-12-05 ENCOUNTER — Encounter (HOSPITAL_COMMUNITY): Payer: Self-pay | Admitting: Surgery

## 2023-12-05 NOTE — Progress Notes (Addendum)
 Anesthesia Chart Review   Case: [id number] Date/Time: 12/06/23 1230   Procedure: REPAIR, HERNIA, INGUINAL, ADULT (Right)   Anesthesia type: General   Diagnosis: Non-recurrent inguinal hernia without obstruction or gangrene, unspecified laterality [K40.90]   Pre-op diagnosis: HERNIA   Location: [facility] ROOM 01 / [facility]   Surgeons: Signe Mitzie LABOR, MD       DISCUSSION:83 y.o. 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 Mitzie Signe.   Pt last seen by PCP 10/11/2023 for routine visit. No changes made at this visit.   Pt last seen by pulmonology 05/03/2023. Pt with h/o asbestos exposure. Per notes pt reports no shortness of breath or respiratory symptoms, remains active, regularly plays golf. No sx of pulmonary HTN or cor pulmonale.   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 I reviewed the images personally.  01/25/2023 PFTs FVC 4.32 [102%], FEV1 3.54 [119%], F/F82, TLC 7.03 [94%], DLCO 28.35 [111%] Normal pulmonary function test  Pacemaker in place due to 2nd degree AV block.  He has had device checks via remote transmission, but has not been seen by EP since 2022.  Last visit with general cardiology in 2021.  EP will not give device orders until he sees them given last visit three years ago.  Discussed with Nat at [facility].  VS: Ht 6' 1 (1.854 m)   Wt 80.7 kg   BMI 23.48 kg/m   PROVIDERS: Amon Aloysius BRAVO, MD is PCP  Cardiologist - Wilbert Bihari, MD EPS - Ole Holts, MD Pulmonologist - Lonna Coder, MD LABS: labs DOS, not seen in PAT clinic, SDW (all labs ordered are listed, but only abnormal results are displayed)  Labs Reviewed - No data to display   IMAGES:   EKG:   CV: Echo 03/31/2020 1. Left ventricular ejection fraction, by estimation, is 60 to 65%. The  left ventricle has normal  function. The left ventricle has no regional  wall motion abnormalities. Left ventricular diastolic parameters are  consistent with Grade I diastolic  dysfunction (impaired relaxation).   2. Right ventricular systolic function is normal. The right ventricular  size is normal. Tricuspid regurgitation signal is inadequate for assessing  PA pressure.   3. The mitral valve is normal in structure. Trivial mitral valve  regurgitation.   4. The aortic valve is tricuspid. Aortic valve regurgitation is not  visualized. Mild aortic valve sclerosis is present, with no evidence of  aortic valve stenosis.   5. There is mild dilatation of the ascending aorta, measuring 38 mm.   6. The inferior vena cava is normal in size with greater than 50%  respiratory variability, suggesting right atrial pressure of 3 mmHg.  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 INVASIVE CV LAB;  Service: Cardiovascular;  Laterality: N/A;   PILONIDAL CYST EXCISION     REFRACTIVE SURGERY     SHOULDER SURGERY     right   TONSILLECTOMY      MEDICATIONS: No current facility-administered medications for this encounter.    Apoaequorin 10 MG CAPS   Ascorbic Acid (VITAMIN C) 1000 MG tablet   atorvastatin (LIPITOR) 20 MG tablet   Black Elderberry 1000 MG CAPS    Cholecalciferol (VITAMIN D3) 50 MCG (2000 UT) capsule   finasteride (PROSCAR) 5 MG tablet   furosemide  (LASIX ) 20 MG tablet   metFORMIN  (GLUCOPHAGE ) 1000 MG tablet   multivitamin (THERAGRAN) per tablet   Omega-3 Fatty Acids (FISH OIL) 1000 MG CAPS   pioglitazone  (ACTOS ) 30 MG tablet   sitaGLIPtin  (JANUVIA ) 100 MG tablet   tamsulosin  (FLOMAX ) 0.4 MG CAPS capsule   glucose blood (FREESTYLE LITE) test strip   Lancets (FREESTYLE) lancets   OFEV  150 MG CAPS    Hamsini Verrilli Ward, PA-C [facility]Surgical Testing [phone]

## 2023-12-05 NOTE — Progress Notes (Addendum)
 COVID Vaccine Completed:  Date of COVID positive in last 90 days:  PCP - Aloysius Mech, MD Cardiologist - Wilbert Bihari, MD EPS - Ole Holts, MD Pulmonologist - Lonna Coder, MD  Chest x-ray -  CT chest 01-07-23 Epic EKG - DOS  Stress Test - Yes, several years ago ECHO - 03-31-20 Epic Cardiac Cath -   N/A  Pacemaker/ICD device last checked: 09-29-23 in Epic.  Device orders requested 12-02-23  Spinal Cord Stimulator:  No  Bowel Prep - N/A  Sleep Study - N/A CPAP -   Fasting Blood Sugar - 104 to 109 Checks Blood Sugar - 1 time a day  Last dose of GLP1 agonist-  N/A GLP1 instructions:  Do not take after     Last dose of SGLT-2 inhibitors-  N/A SGLT-2 instructions:  Do not take after    Blood Thinner Instructions:  N/A Aspirin Instructions: Last Dose:  Activity level:  Can go up a flight of stairs and perform activities of daily living without stopping and without symptoms of chest pain or shortness of breath.  Able to exercise without symptoms.  Anesthesia review:  Pacemaker for second degree AV block, pulmonary fibrosis  Patient denies shortness of breath, fever, cough and chest pain at PAT appointment  Patient verbalized understanding of instructions that were given to them at the PAT appointment. Patient was also instructed that they will need to review over the PAT instructions again at home before surgery.

## 2023-12-06 ENCOUNTER — Ambulatory Visit (HOSPITAL_COMMUNITY): Admission: RE | Admit: 2023-12-06 | Source: Home / Self Care | Admitting: Surgery

## 2023-12-06 ENCOUNTER — Encounter (HOSPITAL_COMMUNITY): Payer: Self-pay | Admitting: Physician Assistant

## 2023-12-06 DIAGNOSIS — E119 Type 2 diabetes mellitus without complications: Secondary | ICD-10-CM

## 2023-12-06 DIAGNOSIS — Z01818 Encounter for other preprocedural examination: Secondary | ICD-10-CM

## 2023-12-06 DIAGNOSIS — I2510 Atherosclerotic heart disease of native coronary artery without angina pectoris: Secondary | ICD-10-CM

## 2023-12-06 HISTORY — DX: Atrioventricular block, second degree: I44.1

## 2023-12-06 SURGERY — REPAIR, HERNIA, INGUINAL, ADULT
Anesthesia: General | Laterality: Right

## 2023-12-07 ENCOUNTER — Encounter: Payer: Self-pay | Admitting: Cardiology

## 2023-12-07 ENCOUNTER — Ambulatory Visit: Attending: Cardiology | Admitting: Cardiology

## 2023-12-07 ENCOUNTER — Ambulatory Visit: Admitting: Internal Medicine

## 2023-12-07 ENCOUNTER — Encounter: Payer: Self-pay | Admitting: Internal Medicine

## 2023-12-07 VITALS — BP 132/72 | HR 69 | Temp 97.9°F | Resp 16 | Ht 72.0 in | Wt 185.2 lb

## 2023-12-07 VITALS — BP 126/62 | HR 72 | Ht 72.0 in | Wt 187.1 lb

## 2023-12-07 DIAGNOSIS — K4090 Unilateral inguinal hernia, without obstruction or gangrene, not specified as recurrent: Secondary | ICD-10-CM

## 2023-12-07 DIAGNOSIS — I442 Atrioventricular block, complete: Secondary | ICD-10-CM | POA: Diagnosis not present

## 2023-12-07 DIAGNOSIS — E1140 Type 2 diabetes mellitus with diabetic neuropathy, unspecified: Secondary | ICD-10-CM | POA: Diagnosis not present

## 2023-12-07 LAB — MICROALBUMIN / CREATININE URINE RATIO
Creatinine,U: 89 mg/dL
Microalb Creat Ratio: 7.9 mg/g (ref 0.0–30.0)
Microalb, Ur: 0.7 mg/dL (ref 0.0–1.9)

## 2023-12-07 NOTE — Patient Instructions (Signed)
 Medication Instructions:  Your physician recommends that you continue on your current medications as directed. Please refer to the Current Medication list given to you today.  *If you need a refill on your cardiac medications before your next appointment, please call your pharmacy*  Lab Work: None ordered.  You may go to any [facility] Location for your lab work:  [facility] - [id number] ([facility]) - [address] [phone] [address]  [place] - [address]   [place]  - [address]   [place] - [address] - [address] [employer]  - [address] [place] - [address] ([facility]   If you have labs (blood work) drawn today and your tests are completely normal, you will receive your results only by: [employer] (if you have MyChart)  If you have any lab test that is abnormal or we need to change your treatment, we will call you or send a MyChart message to review the results.  Testing/Procedures: None ordered.  Follow-Up: At [facility], you and your health needs are our priority.  As part of our continuing mission to provide you with exceptional heart care, we have created designated Provider Care Teams.  These Care Teams include your primary Cardiologist (physician) and Advanced Practice Providers (APPs -  Physician Assistants and Nurse Practitioners) who all work together to provide you with the care you need, when you need it.  We recommend signing up for the patient portal called MyChart.  Sign up information is provided on this After Visit Summary.  MyChart is used to connect with patients for Virtual Visits (Telemedicine).  Patients are able to view lab/test results, encounter notes, upcoming appointments, etc.  Non-urgent messages can be sent to your provider as well.   To learn more about what you can do with MyChart, go to  [email or web address].    Your next appointment:   1 year(s)  The format for your next appointment:   In Person  Provider:   Advanced Practice Providers on your designated Care Team:   Charlies Arthur, PA-C Michael Andy Tillery, PA-C Brandy Ollis, NP  Note: Remote monitoring is used to monitor your Pacemaker/ ICD from home. This monitoring reduces the number of office visits required to check your device to one time per year. It allows us  to keep an eye on the functioning of your device to ensure it is working properly.

## 2023-12-07 NOTE — Progress Notes (Unsigned)
 Subjective:    Patient ID: Chase Martinez, male    DOB: 15-Jun-1940, 83 y.o.   MRN: [id number]  DOS:  12/07/2023 Type of visit - description: Acute  Noted a lump at the right groin 11/09/2023, was seen here by [facility], diagnosed with a hernia, was evaluated by surgery 12/02/2023. Surgical repair canceled yesterday because he has not seen cardiology in years.  Denies chest pain or difficulty breathing.  No palpitations. Denies nausea vomiting. He had some constipation yesterday helped with straining with BMs, noted the hernia bulge but it was reducible.  He got concerned.  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   Internal hemorrhoids    Premature atrial contractions    Second degree AV block    Pacemaker implanted   Tubular adenoma of colon     Past Surgical History:  Procedure Laterality Date   CATARACT EXTRACTION Bilateral 05/24/2013   EYE SURGERY  Cataracts Removal   MOHS SURGERY     PACEMAKER IMPLANT N/A 04/03/2020   Procedure: PACEMAKER IMPLANT;  Surgeon: Cindie Ole DASEN, MD;  Location: [facility];  Service: Cardiovascular;  Laterality: N/A;   PILONIDAL CYST EXCISION     REFRACTIVE SURGERY     SHOULDER SURGERY     right   TONSILLECTOMY      Current Outpatient Medications  Medication Instructions   Apoaequorin 10 mg, Daily   atorvastatin (LIPITOR) 20 mg, Oral, Daily   Black Elderberry 1,000 mg, Every evening   finasteride (PROSCAR) 5 mg, Every morning   Fish Oil 1,000 mg, Every morning   furosemide  (LASIX ) 20 mg, Oral, Daily   glucose blood (FREESTYLE LITE) test strip USE TO CHECK BLOOD SUGAR, NOT MORE THAN TWICE A DAY   Lancets (FREESTYLE) lancets  Check blood sugars no more than twice daily   metFORMIN  (GLUCOPHAGE ) 1,000 mg, Oral, 2 times daily with meals   multivitamin (THERAGRAN) per tablet 1 tablet, Daily   OFEV  150 MG CAPS TAKE 1 CAPSULE TWICE DAILY (12 HOURS APART) WITH FOOD.   pioglitazone  (ACTOS ) 30 mg, Oral, Daily   sitaGLIPtin  (JANUVIA ) 100 mg, Oral, Daily   tamsulosin  (FLOMAX ) 0.4 mg, 2 times daily   vitamin C 1,000 mg, Every morning   Vitamin D3 2,000 Units, Daily       Objective:   Physical Exam BP 132/72   Pulse 69   Temp 97.9 F (36.6 C) (Oral)   Resp 16   Ht 6' (1.829 m)   Wt 185 lb 4 oz (84 kg)   SpO2 97%   BMI 25.12 kg/m  General:   Well developed, NAD, BMI noted. HEENT:  Normocephalic . Face symmetric, atraumatic Abdomen: Hernia in the right groin noted with Valsalva only.  Reducible. DM foot exam: No edema, good toe perfusion, pinprick examination decreased distally Skin: Not pale. Not jaundice Neurologic:  alert & oriented X3.  Speech normal, gait appropriate for age and unassisted Psych--  Cognition and judgment appear intact.  Cooperative with normal attention span and concentration.  Behavior appropriate. No anxious or depressed appearing.      Assessment  Assessment: DM: + neuropathy HTN Hyperlipidemia CV: ---PVCs  -- DR Shlomo HARPER 03-2015, on CCB,BB ---High degree AV Block-pacemaker 04/03/2020 GU: --BPH -- urinary retention, ER visit 09/2022 [facility] Dr. Joshua Nail dystrophy  Asbestosis dx 2019 Shingles 04-2020  PLAN: R groin hernia: Was supposed to have surgery yesterday but it was canceled as he needed to see cardiology due to history of pacemaker. Until surgery is done, recommend to seek medical attention if hernia gets a lot larger or tender.  Or if he has nausea or vomiting. He has some constipation and got concerned, recommend Colace as needed. High degree AV block, pacemaker: Asymptomatic, to see cardiology today. Neuropathy: See foot exam today, feet care  discussed. DM: Controlled, check micro. RTC scheduled for September  5-20 DM: Last A1c 6.3, currently on metformin -pioglitazone -Januvia .  Ambulatory CBGs on average-114.  Check A1c. HTN: Ambulatory BPs 129/66.  On Lasix , check a BMP and CBC. High cholesterol: On atorvastatin 20 mg.  LDL 63 last year.  Recheck FLP AST ALT. Asbestosis: Taking OFEV  since September 2024, since then is having consistent diarrhea described as loose stool and some weight loss. He is holding OFEV   since May 13 and diarrhea has stopped. Recommend to discuss with pulmonary. RTC 4 months

## 2023-12-07 NOTE — Patient Instructions (Addendum)
   Check the  blood pressure regularly Blood pressure goal:  between 110/65 and  135/85. If it is consistently higher or lower, let me know     GO TO THE LAB :  Get the blood work provide a urine sample  See you in September.  Sooner if needed

## 2023-12-07 NOTE — Progress Notes (Signed)
  Electrophysiology Office Follow up Visit Note:    Date:  12/07/2023   ID:  Chase Martinez, DOB January 11, 1941, MRN [id number]  PCP:  Amon Aloysius BRAVO, MD  [facility] Cardiologist:  Wilbert Bihari, MD  [facility] Electrophysiologist:  OLE ONEIDA HOLTS, MD    Interval History:     Chase Martinez is a 83 y.o. male who presents for a follow up visit.   He is doing okay today.  He has an upcoming hernia repair surgery.  He is requesting cardiac surgical risk stratification during today's appointment.  No problems with his pacemaker.  No lightheadedness or dizziness.  No signs of heart failure.  He has a good activity level.        Past medical, surgical, social and family history were reviewed.  ROS:   Please see the history of present illness.    All other systems reviewed and are negative.  EKGs/Labs/Other Studies Reviewed:    The following studies were reviewed today:   December 07, 2023 in clinic device interrogation personally reviewed Battery longevity 8 years Lead parameter stable Programmed DDD 50-1 30 No programming changes made today       Physical Exam:    VS:  BP 126/62   Pulse 72   Ht 6' (1.829 m)   Wt 187 lb 1.6 oz (84.9 kg)   SpO2 97%   BMI 25.38 kg/m     Wt Readings from Last 3 Encounters:  12/07/23 187 lb 1.6 oz (84.9 kg)  12/07/23 185 lb 4 oz (84 kg)  11/17/23 186 lb (84.4 kg)     GEN: no distress CARD: RRR, No MRG RESP: No IWOB. CTAB.      ASSESSMENT:    1. Heart block AV complete (HCC)    PLAN:    In order of problems listed above:  #Preoperative stratification The patient is at acceptable risk to undergo planned surgical procedure.  No additional cardiovascular testing is required.  #Complete heart block #Permanent pacemaker in situ Device functioning appropriately.  Continue remote monitoring.  Follow-up 1 year with EP APP     Signed, OLE HOLTS, MD, [facility], [facility] 12/07/2023 4:37 PM    Electrophysiology [facility]

## 2023-12-08 ENCOUNTER — Encounter: Payer: Self-pay | Admitting: Internal Medicine

## 2023-12-08 ENCOUNTER — Ambulatory Visit: Payer: Self-pay | Admitting: Internal Medicine

## 2023-12-08 ENCOUNTER — Encounter: Payer: Self-pay | Admitting: Cardiology

## 2023-12-08 ENCOUNTER — Encounter: Payer: Self-pay | Admitting: Pulmonary Disease

## 2023-12-08 NOTE — Progress Notes (Addendum)
 COVID Vaccine Completed:   Date of COVID positive in last 90 days:   PCP - Aloysius Mech, MD Cardiologist - Wilbert Bihari, MD EPS - Ole Holts, MD  Cardiac clearance in 12-07-23 Epic note Pulmonologist - Lonna Coder, MD   Chest x-ray -  CT chest 01-07-23 Epic EKG - DOS  Stress Test - Yes, several years ago ECHO - 03-31-20 Epic Cardiac Cath -   N/A   Pacemaker/ICD device last checked: 09-29-23 in Epic.  Device orders in [facility] 12-08-23 by Delon Sharps RN [employer] DR-RF - [id number]    Spinal Cord Stimulator:  No   Sleep Study - N/A CPAP -    Fasting Blood Sugar - 104 to 109 Checks Blood Sugar - 1 time a day   Last dose of GLP1 agonist-  N/A GLP1 instructions:  Do not take after     Last dose of SGLT-2 inhibitors-  N/A SGLT-2 instructions:  Do not take after     Blood Thinner Instructions:  N/A Aspirin Instructions: Last Dose:   Activity level:  Can go up a flight of stairs and perform activities of daily living without stopping and without symptoms of chest pain or shortness of breath.  Able to exercise without symptoms.   Anesthesia review:  Pacemaker for second degree AV block, pulmonary fibrosis, DM2,    Patient denies shortness of breath, fever, cough and chest pain at PAT appointment   Patient verbalized understanding of instructions that were given to them at the PAT appointment. Patient was also instructed that they will need to review over the PAT instructions again at home before surgery.

## 2023-12-08 NOTE — Assessment & Plan Note (Signed)
 R groin hernia: Was supposed to have surgery yesterday but it was canceled as he needed to see cardiology due to history of pacemaker. Until surgery is done, recommend to seek medical attention if hernia gets a lot larger or tender.  Or if he has nausea or vomiting. He has some constipation and got concerned, recommend Colace as needed. High degree AV block, pacemaker: Asymptomatic, to see cardiology today. Neuropathy: See foot exam today, feet care discussed. DM: Controlled, check micro. RTC scheduled for September

## 2023-12-08 NOTE — Patient Instructions (Signed)
 SURGICAL WAITING ROOM VISITATION Patients having surgery or a procedure may have no more than 2 support people in the waiting area - these visitors may rotate in the visitor waiting room.   If the patient needs to stay at the hospital during part of their recovery, the visitor guidelines for inpatient rooms apply.  PRE-OP VISITATION  Pre-op nurse will coordinate an appropriate time for 1 support person to accompany the patient in pre-op.  This support person may not rotate.  This visitor will be contacted when the time is appropriate for the visitor to come back in the pre-op area.  Please refer to the [facility] website for the visitor guidelines for Inpatients (after your surgery is over and you are in a regular room).  You are not required to quarantine at this time prior to your surgery. However, you must do this: Hand Hygiene often Do NOT share personal items Notify your provider if you are in close contact with someone who has COVID or you develop fever 100.4 or greater, new onset of sneezing, cough, sore throat, shortness of breath or body aches.  If you test positive for Covid or have been in contact with anyone that has tested positive in the last 10 days please notify you surgeon.    Your procedure is scheduled on:  Tuesday  December 13, 2023  Report to [facility] Main Entrance: [address] entrance where the [employer] is available.   Report to admitting at: 12:45  PM  Call this number if you have any questions or problems the morning of surgery [phone]  DO NOT EAT OR DRINK ANYTHING AFTER MIDNIGHT THE NIGHT PRIOR TO YOUR SURGERY / PROCEDURE.   FOLLOW  ANY ADDITIONAL PRE OP INSTRUCTIONS YOU RECEIVED FROM YOUR SURGEON'S OFFICE!!!   Oral Hygiene is also important to reduce your risk of infection.        Remember - BRUSH YOUR TEETH THE MORNING OF SURGERY WITH YOUR REGULAR TOOTHPASTE  Do NOT smoke after Midnight the night before surgery.  Diabetic  Medications  Sitagliptin  (Januvia ) - Day BEFORE surgery, take as usual.  DO NOT TAKE on the the day of surgery Pioglitazone  (Actos )- Day BEFORE surgery, take as usual.  DO NOT TAKE on the the day of surgery Metformin  - Day BEFORE surgery, take as usual.  DO NOT TAKE on the the day of surgery  If you have any questions about your Diabetic medications, call [phone]  STOP TAKING all Vitamins, Herbs and supplements 1 week before your surgery.   Take ONLY these medicines the morning of surgery with A SIP OF WATER: Tamsulosin , Finasteride.   DO NOT TAKE Furosemide  (Lasix ) on the day of your surgery.    If You have been diagnosed with Sleep Apnea - Bring CPAP mask and tubing day of surgery. We will provide you with a CPAP machine on the day of your surgery.                   You may not have any metal on your body including  jewelry, and body piercing  Do not wear  lotions, powders, cologne, or deodorant  Men may shave face and neck.  Contacts, Hearing Aids, dentures or bridgework may not be worn into surgery. DENTURES WILL BE REMOVED PRIOR TO SURGERY PLEASE DO NOT APPLY Poly grip OR ADHESIVES!!!  You may bring a small overnight bag with you on the day of surgery, only pack items that are not valuable. [facility] IS  NOT RESPONSIBLE   FOR VALUABLES THAT ARE LOST OR STOLEN.   Patients discharged on the day of surgery will not be allowed to drive home.  Someone NEEDS to stay with you for the first 24 hours after anesthesia.  Do not bring your home medications to the hospital. The Pharmacy will dispense medications listed on your medication list to you during your admission in the Hospital.  Special Instructions: Bring a copy of your healthcare power of attorney and living will documents the day of surgery, if you wish to have them scanned into your [facility] Records- EPIC  Please read over the following fact sheets you were given: IF YOU HAVE QUESTIONS ABOUT YOUR PRE-OP  INSTRUCTIONS, PLEASE CALL [phone].   [place] - Preparing for Surgery Before surgery, you can play an important role.  Because skin is not sterile, your skin needs to be as free of germs as possible.  You can reduce the number of germs on your skin by washing with CHG (chlorahexidine gluconate) soap before surgery.  CHG is an antiseptic cleaner which kills germs and bonds with the skin to continue killing germs even after washing. Please DO NOT use if you have an allergy to CHG or antibacterial soaps.  If your skin becomes reddened/irritated stop using the CHG and inform your nurse when you arrive at Short Stay. Do not shave (including legs and underarms) for at least 48 hours prior to the first CHG shower.  You may shave your face/neck.  Please follow these instructions carefully:  1.  Shower with CHG Soap the night before surgery and the  morning of surgery.  2.  If you choose to wash your hair, wash your hair first as usual with your normal  shampoo.  3.  After you shampoo, rinse your hair and body thoroughly to remove the shampoo.                             4.  Use CHG as you would any other liquid soap.  You can apply chg directly to the skin and wash.  Gently with a scrungie or clean washcloth.  5.  Apply the CHG Soap to your body ONLY FROM THE NECK DOWN.   Do not use on face/ open                           Wound or open sores. Avoid contact with eyes, ears mouth and genitals (private parts).                       Wash face,  Genitals (private parts) with your normal soap.             6.  Wash thoroughly, paying special attention to the area where your  surgery  will be performed.  7.  Thoroughly rinse your body with warm water from the neck down.  8.  DO NOT shower/wash with your normal soap after using and rinsing off the CHG Soap.            9.  Pat yourself dry with a clean towel.            10.  Wear clean pajamas.            11.  Place clean sheets on your bed the night of  your first shower and do not  sleep with  pets.  ON THE DAY OF SURGERY : Do not apply any lotions/deodorants the morning of surgery.  Please wear clean clothes to the hospital/surgery center.    FAILURE TO FOLLOW THESE INSTRUCTIONS MAY RESULT IN THE CANCELLATION OF YOUR SURGERY  PATIENT SIGNATURE_________________________________  NURSE SIGNATURE__________________________________  ________________________________________________________________________

## 2023-12-08 NOTE — Progress Notes (Signed)
 PERIOPERATIVE PRESCRIPTION FOR IMPLANTED CARDIAC DEVICE PROGRAMMING  Patient Information: Name:  Chase Martinez  DOB:  [birth year]  MRN:  [id number]  Planned Procedure:  Right inguinal hernia repair  Surgeon:  Dr Mitzie Freund  Date of Procedure:  12/06/2023  Cautery will be used.  Position during surgery:  supine   Device Information:  Clinic EP Physician:  Dr. Ole Holts  Device Type:  Pacemaker Manufacturer and Phone #:  [employer]: [phone] Pacemaker Dependent?:  No. Date of Last Device Check:  12/07/2023 Normal Device Function?:  Yes.    Electrophysiologist's Recommendations:  Have magnet available. Provide continuous ECG monitoring when magnet is used or reprogramming is to be performed.  Procedure should not interfere with device function.  No device programming or magnet placement needed.  Per Device Clinic Standing Orders, Chase DELENA Sharps, RN  7:09 AM 12/08/2023

## 2023-12-09 ENCOUNTER — Other Ambulatory Visit: Payer: Self-pay

## 2023-12-09 ENCOUNTER — Telehealth: Payer: Self-pay

## 2023-12-09 ENCOUNTER — Encounter (HOSPITAL_COMMUNITY): Payer: Self-pay

## 2023-12-09 ENCOUNTER — Encounter (HOSPITAL_COMMUNITY)
Admission: RE | Admit: 2023-12-09 | Discharge: 2023-12-09 | Disposition: A | Discharge status: Home / Self Care | Source: Ambulatory Visit | Attending: Surgery | Admitting: Surgery

## 2023-12-09 DIAGNOSIS — E119 Type 2 diabetes mellitus without complications: Secondary | ICD-10-CM | POA: Insufficient documentation

## 2023-12-09 DIAGNOSIS — I10 Essential (primary) hypertension: Secondary | ICD-10-CM | POA: Insufficient documentation

## 2023-12-09 DIAGNOSIS — Z77090 Contact with and (suspected) exposure to asbestos: Secondary | ICD-10-CM | POA: Insufficient documentation

## 2023-12-09 DIAGNOSIS — Z01818 Encounter for other preprocedural examination: Secondary | ICD-10-CM | POA: Diagnosis not present

## 2023-12-09 DIAGNOSIS — Z950 Presence of cardiac pacemaker: Secondary | ICD-10-CM | POA: Insufficient documentation

## 2023-12-09 DIAGNOSIS — I2510 Atherosclerotic heart disease of native coronary artery without angina pectoris: Secondary | ICD-10-CM | POA: Diagnosis not present

## 2023-12-09 DIAGNOSIS — Z7984 Long term (current) use of oral hypoglycemic drugs: Secondary | ICD-10-CM | POA: Insufficient documentation

## 2023-12-09 DIAGNOSIS — D649 Anemia, unspecified: Secondary | ICD-10-CM | POA: Insufficient documentation

## 2023-12-09 DIAGNOSIS — K4090 Unilateral inguinal hernia, without obstruction or gangrene, not specified as recurrent: Secondary | ICD-10-CM | POA: Insufficient documentation

## 2023-12-09 DIAGNOSIS — Z87891 Personal history of nicotine dependence: Secondary | ICD-10-CM | POA: Insufficient documentation

## 2023-12-09 LAB — BASIC METABOLIC PANEL WITH GFR
Anion gap: 9 (ref 5–15)
BUN: 18 mg/dL (ref 8–23)
CO2: 27 mmol/L (ref 22–32)
Calcium: 9.8 mg/dL (ref 8.9–10.3)
Chloride: 97 mmol/L — ABNORMAL LOW (ref 98–111)
Creatinine, Ser: 0.83 mg/dL (ref 0.61–1.24)
GFR, Estimated: >60 mL/min (ref >60)
Glucose, Bld: 97 mg/dL (ref 70–99)
Potassium: 4 mmol/L (ref 3.5–5.1)
Sodium: 133 mmol/L — ABNORMAL LOW (ref 135–145)

## 2023-12-09 LAB — CBC
HCT: 36.9 % — ABNORMAL LOW (ref 39.0–52.0)
Hemoglobin: 12 g/dL — ABNORMAL LOW (ref 13.0–17.0)
MCH: 32.5 pg (ref 26.0–34.0)
MCHC: 32.5 g/dL (ref 30.0–36.0)
MCV: 100 fL (ref 80.0–100.0)
Platelets: 253 K/uL (ref 150–400)
RBC: 3.69 MIL/uL — ABNORMAL LOW (ref 4.22–5.81)
RDW: 13.7 % (ref 11.5–15.5)
WBC: 6.4 K/uL (ref 4.0–10.5)
nRBC: 0 % (ref 0.0–0.2)

## 2023-12-09 LAB — GLUCOSE, CAPILLARY: Glucose-Capillary: 111 mg/dL — ABNORMAL HIGH (ref 70–99)

## 2023-12-09 NOTE — Telephone Encounter (Signed)
 Routing to Dr Theophilus so he is aware.

## 2023-12-12 ENCOUNTER — Encounter (HOSPITAL_COMMUNITY): Payer: Self-pay

## 2023-12-12 NOTE — Progress Notes (Signed)
 Case: [id number] Date/Time: 12/13/23 1445   Procedure: REPAIR, HERNIA, INGUINAL, ADULT (Right)   Anesthesia type: General   Diagnosis: Right inguinal hernia [K40.90]   Pre-op diagnosis: HERNIA   Location: [facility] ROOM 01 / [facility]   Surgeons: Signe Mitzie LABOR, MD       DISCUSSION: Chase Martinez is an 83 yo male who presents to PAT prior to surgery above. PMH of former smoking, HTN, CHB s/p PPM (2011), hx of asbestosis, DM.  Pt follows with Cardiology for hx of CHB s/p PPM. Seen on 12/07/23 for cardiac clearance. No complaints issues noted. Cleared for surgery:  Preoperative stratification The patient is at acceptable risk to undergo planned surgical procedure.  No additional cardiovascular testing is required.  Device orders (in 12/08/23 note): Electrophysiologist's Recommendations:   Have magnet available. Provide continuous ECG monitoring when magnet is used or reprogramming is to be performed.  Procedure should not interfere with device function.  No device programming or magnet placement needed.  Pt last seen by pulmonology 05/03/2023. Pt with h/o asbestos exposure. Per notes pt reports no shortness of breath or respiratory symptoms, remains active, regularly plays golf. No sx of pulmonary HTN or cor pulmonale.   VS: BP (!) 128/58   Pulse 75   Temp 36.5 C (Oral)   Resp 16   Ht 6' (1.829 m)   Wt 83.5 kg   SpO2 99%   BMI 24.95 kg/m   PROVIDERS: Amon Aloysius BRAVO, MD Cardiologist - Wilbert Bihari, MD EPS - Ole Holts, MD  Cardiac clearance in 12-07-23 Epic note Pulmonologist - Lonna Coder, MD  LABS: Labs reviewed: Acceptable for surgery. Mild anemia (all labs ordered are listed, but only abnormal results are displayed)  Labs Reviewed  BASIC METABOLIC PANEL WITH GFR - Abnormal; Notable for the following components:      Result Value   Sodium 133 (*)    Chloride 97 (*)    All other components within normal limits  CBC - Abnormal; Notable for the following components:    RBC 3.69 (*)    Hemoglobin 12.0 (*)    HCT 36.9 (*)    All other components within normal limits  GLUCOSE, CAPILLARY - Abnormal; Notable for the following components:   Glucose-Capillary 111 (*)    All other components within normal limits     IMAGES: CT Chest 01/07/23:  IMPRESSION: 1. The appearance of the lungs is indicative of interstitial lung disease, with a spectrum of findings considered probable usual interstitial pneumonia (UIP) per current ATS guidelines. Findings are mildly progressive compared to the prior study. Given the calcified pleural plaques bilaterally which are indicative of asbestos related pleural disease, these findings likely represent a manifestation of asbestosis. 2. Aortic atherosclerosis, in addition to left main and three-vessel coronary artery disease. Please note that although the presence of coronary artery calcium documents the presence of coronary artery disease, the severity of this disease and any potential stenosis cannot be assessed on this non-gated CT examination. Assessment for potential risk factor modification, dietary therapy or pharmacologic therapy may be warranted, if clinically indicated.   Aortic Atherosclerosis (ICD10-I70.0).  EKG 12/09/23:  NSR, rate 69  CV: Echo 03/31/2020 1. Left ventricular ejection fraction, by estimation, is 60 to 65%. The  left ventricle has normal function. The left ventricle has no regional  wall motion abnormalities. Left ventricular diastolic parameters are  consistent with Grade I diastolic  dysfunction (impaired relaxation).   2. Right ventricular systolic function is normal. The right ventricular  size is normal. Tricuspid regurgitation signal is inadequate for assessing  PA pressure.   3. The mitral valve is normal in structure. Trivial mitral valve  regurgitation.   4. The aortic valve is tricuspid. Aortic valve regurgitation is not  visualized. Mild aortic valve sclerosis is present,  with no evidence of  aortic valve stenosis.   5. There is mild dilatation of the ascending aorta, measuring 38 mm.   6. The inferior vena cava is normal in size with greater than 50%  respiratory variability, suggesting right atrial pressure of 3 mmHg.  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   Internal hemorrhoids    Premature atrial contractions    Second degree AV block    Pacemaker implanted   Tubular adenoma of colon     Past Surgical History:  Procedure Laterality Date   CATARACT EXTRACTION Bilateral 05/24/2013   EYE SURGERY  Cataracts Removal   MOHS SURGERY     PACEMAKER IMPLANT N/A 04/03/2020   Procedure: PACEMAKER IMPLANT;  Surgeon: Cindie Ole DASEN, MD;  Location: [facility] LAB;  Service: Cardiovascular;  Laterality: N/A;   PILONIDAL CYST EXCISION     REFRACTIVE SURGERY     SHOULDER SURGERY     right   bone spur removal , bicep repair   TONSILLECTOMY      MEDICATIONS:  Apoaequorin 10 MG CAPS   Ascorbic Acid (VITAMIN C) 1000 MG tablet   atorvastatin (LIPITOR) 20 MG tablet   Black Elderberry 1000 MG CAPS   Cholecalciferol (VITAMIN D3) 50 MCG (2000 UT) capsule   finasteride (PROSCAR) 5 MG tablet   furosemide  (LASIX ) 20 MG tablet   glucose blood (FREESTYLE LITE) test strip   Lancets (FREESTYLE) lancets   metFORMIN  (GLUCOPHAGE ) 1000 MG tablet   multivitamin (THERAGRAN) per tablet   OFEV  150 MG CAPS   Omega-3 Fatty Acids (FISH OIL) 1000 MG CAPS   pioglitazone  (ACTOS ) 30 MG tablet   sitaGLIPtin  (JANUVIA ) 100 MG tablet   tamsulosin  (FLOMAX ) 0.4 MG CAPS capsule   No current facility-administered medications for this encounter.    Burnard CHRISTELLA Odis DEVONNA [facility] Surgical Short  Stay/Anesthesiology [facility] Phone [phone] 12/12/2023 8:56 AM

## 2023-12-12 NOTE — Anesthesia Preprocedure Evaluation (Signed)
 Anesthesia Evaluation  Patient identified by MRN, date of birth, ID band Patient awake    Reviewed: Allergy & Precautions, NPO status , Patient's Chart, lab work & pertinent test results  Airway Mallampati: II  TM Distance: >3 FB Neck ROM: Full    Dental no notable dental hx.    Pulmonary neg pulmonary ROS, former smoker   Pulmonary exam normal        Cardiovascular hypertension, + dysrhythmias + pacemaker  Rhythm:Regular Rate:Normal     Neuro/Psych negative neurological ROS  negative psych ROS   GI/Hepatic Neg liver ROS,,,hernia   Endo/Other  diabetes, Type 2, Oral Hypoglycemic Agents    Renal/GU negative Renal ROS  negative genitourinary   Musculoskeletal negative musculoskeletal ROS (+)    Abdominal Normal abdominal exam  (+)   Peds  Hematology Lab Results      Component                Value               Date                      WBC                      6.4                 12/09/2023                HGB                      12.0 (L)            12/09/2023                HCT                      36.9 (L)            12/09/2023                MCV                      100.0               12/09/2023                PLT                      253                 12/09/2023              Anesthesia Other Findings   Reproductive/Obstetrics                              Anesthesia Physical Anesthesia Plan  ASA: 3  Anesthesia Plan: General   Post-op Pain Management: Tylenol  PO (pre-op)* and Gabapentin  PO (pre-op)*   Induction: Intravenous  PONV Risk Score and Plan: 2 and Ondansetron , Dexamethasone  and Treatment may vary due to age or medical condition  Airway Management Planned: Mask and Oral ETT  Additional Equipment: None  Intra-op Plan:   Post-operative Plan: Extubation in OR  Informed Consent: I have reviewed the patients History and Physical, chart, labs and discussed the  procedure including the risks, benefits and alternatives for the proposed anesthesia with the patient or  authorized representative who has indicated his/her understanding and acceptance.     Dental advisory given  Plan Discussed with: CRNA  Anesthesia Plan Comments: (See PAT note from 7/18)         Anesthesia Quick Evaluation

## 2023-12-13 ENCOUNTER — Ambulatory Visit (HOSPITAL_COMMUNITY): Admitting: Certified Registered Nurse Anesthetist

## 2023-12-13 ENCOUNTER — Encounter (HOSPITAL_COMMUNITY): Payer: Self-pay | Admitting: Surgery

## 2023-12-13 ENCOUNTER — Other Ambulatory Visit (HOSPITAL_COMMUNITY): Payer: Self-pay

## 2023-12-13 ENCOUNTER — Ambulatory Visit (HOSPITAL_COMMUNITY): Payer: Self-pay | Admitting: Medical

## 2023-12-13 ENCOUNTER — Encounter (HOSPITAL_COMMUNITY)
Admission: RE | Disposition: A | Discharge status: Home / Self Care | Payer: Self-pay | Source: Home / Self Care | Attending: Surgery

## 2023-12-13 ENCOUNTER — Ambulatory Visit (HOSPITAL_COMMUNITY)
Admission: RE | Admit: 2023-12-13 | Discharge: 2023-12-13 | Disposition: A | Discharge status: Home / Self Care | Attending: Surgery | Admitting: Surgery

## 2023-12-13 DIAGNOSIS — E119 Type 2 diabetes mellitus without complications: Secondary | ICD-10-CM | POA: Diagnosis not present

## 2023-12-13 DIAGNOSIS — Z87891 Personal history of nicotine dependence: Secondary | ICD-10-CM | POA: Diagnosis not present

## 2023-12-13 DIAGNOSIS — K4090 Unilateral inguinal hernia, without obstruction or gangrene, not specified as recurrent: Secondary | ICD-10-CM | POA: Diagnosis not present

## 2023-12-13 DIAGNOSIS — D176 Benign lipomatous neoplasm of spermatic cord: Secondary | ICD-10-CM | POA: Insufficient documentation

## 2023-12-13 DIAGNOSIS — I10 Essential (primary) hypertension: Secondary | ICD-10-CM | POA: Insufficient documentation

## 2023-12-13 DIAGNOSIS — Z950 Presence of cardiac pacemaker: Secondary | ICD-10-CM | POA: Insufficient documentation

## 2023-12-13 DIAGNOSIS — E785 Hyperlipidemia, unspecified: Secondary | ICD-10-CM | POA: Diagnosis not present

## 2023-12-13 DIAGNOSIS — Z7984 Long term (current) use of oral hypoglycemic drugs: Secondary | ICD-10-CM | POA: Insufficient documentation

## 2023-12-13 HISTORY — PX: INGUINAL HERNIA REPAIR: SHX194

## 2023-12-13 LAB — GLUCOSE, CAPILLARY
Glucose-Capillary: 117 mg/dL — ABNORMAL HIGH (ref 70–99)
Glucose-Capillary: 144 mg/dL — ABNORMAL HIGH (ref 70–99)

## 2023-12-13 SURGERY — REPAIR, HERNIA, INGUINAL, ADULT
Anesthesia: General | Laterality: Right

## 2023-12-13 MED ORDER — SUGAMMADEX SODIUM 200 MG/2ML IV SOLN
INTRAVENOUS | Status: DC | PRN
  Administered 2023-12-13: 200 mg via INTRAVENOUS

## 2023-12-13 MED ORDER — FENTANYL CITRATE (PF) 100 MCG/2ML IJ SOLN
INTRAMUSCULAR | Status: DC | PRN
  Administered 2023-12-13: 100 ug via INTRAVENOUS

## 2023-12-13 MED ORDER — LACTATED RINGERS IV SOLN: INTRAVENOUS | Status: DC

## 2023-12-13 MED ORDER — ONDANSETRON HCL 4 MG/2ML IJ SOLN
INTRAMUSCULAR | Status: DC | PRN
  Administered 2023-12-13: 4 mg via INTRAVENOUS

## 2023-12-13 MED ORDER — BUPIVACAINE LIPOSOME 1.3 % IJ SUSP
INTRAMUSCULAR | Status: AC
  Filled 2023-12-13: qty 20

## 2023-12-13 MED ORDER — ROCURONIUM BROMIDE 10 MG/ML (PF) SYRINGE
PREFILLED_SYRINGE | INTRAVENOUS | Status: DC | PRN
  Administered 2023-12-13: 50 mg via INTRAVENOUS

## 2023-12-13 MED ORDER — STERILE WATER FOR IRRIGATION IR SOLN
Status: DC | PRN
Start: 2023-12-13 — End: 2023-12-13
  Administered 2023-12-13: 1000 mL

## 2023-12-13 MED ORDER — DEXAMETHASONE SODIUM PHOSPHATE 10 MG/ML IJ SOLN
INTRAMUSCULAR | Status: AC
  Filled 2023-12-13: qty 1

## 2023-12-13 MED ORDER — CHLORHEXIDINE GLUCONATE 4 % EX SOLN: 60.0000 mL | Freq: Once | CUTANEOUS | Status: DC

## 2023-12-13 MED ORDER — CEFAZOLIN SODIUM-DEXTROSE 2-4 GM/100ML-% IV SOLN
2.0000 g | INTRAVENOUS | Status: AC
  Administered 2023-12-13: 2 g via INTRAVENOUS
  Filled 2023-12-13: qty 100

## 2023-12-13 MED ORDER — ONDANSETRON HCL 4 MG/2ML IJ SOLN
INTRAMUSCULAR | Status: AC
  Filled 2023-12-13: qty 2

## 2023-12-13 MED ORDER — ACETAMINOPHEN 500 MG PO TABS
1000.0000 mg | ORAL_TABLET | ORAL | Status: AC
  Administered 2023-12-13: 1000 mg via ORAL
  Filled 2023-12-13: qty 2

## 2023-12-13 MED ORDER — INSULIN ASPART 100 UNIT/ML IJ SOLN: 0.0000 [IU] | INTRAMUSCULAR | Status: DC | PRN

## 2023-12-13 MED ORDER — 0.9 % SODIUM CHLORIDE (POUR BTL) OPTIME
TOPICAL | Status: DC | PRN
Start: 2023-12-13 — End: 2023-12-13
  Administered 2023-12-13: 1000 mL

## 2023-12-13 MED ORDER — SUGAMMADEX SODIUM 200 MG/2ML IV SOLN
INTRAVENOUS | Status: AC
  Filled 2023-12-13: qty 2

## 2023-12-13 MED ORDER — DOCUSATE SODIUM 100 MG PO CAPS
100.0000 mg | ORAL_CAPSULE | Freq: Two times a day (BID) | ORAL | 0 refills | Status: AC
  Filled 2023-12-13: qty 30, 15d supply, fill #0

## 2023-12-13 MED ORDER — BUPIVACAINE-EPINEPHRINE (PF) 0.25% -1:200000 IJ SOLN
INTRAMUSCULAR | Status: AC
  Filled 2023-12-13: qty 30

## 2023-12-13 MED ORDER — ORAL CARE MOUTH RINSE
15.0000 mL | Freq: Once | OROMUCOSAL | Status: AC
Start: 2023-12-13 — End: 2023-12-13

## 2023-12-13 MED ORDER — PROPOFOL 10 MG/ML IV BOLUS
INTRAVENOUS | Status: DC | PRN
Start: 2023-12-13 — End: 2023-12-13
  Administered 2023-12-13: 110 mg via INTRAVENOUS

## 2023-12-13 MED ORDER — ROCURONIUM BROMIDE 10 MG/ML (PF) SYRINGE
PREFILLED_SYRINGE | INTRAVENOUS | Status: AC
  Filled 2023-12-13: qty 10

## 2023-12-13 MED ORDER — BUPIVACAINE-EPINEPHRINE 0.25% -1:200000 IJ SOLN
INTRAMUSCULAR | Status: DC | PRN
  Administered 2023-12-13: 30 mL

## 2023-12-13 MED ORDER — CHLORHEXIDINE GLUCONATE 0.12 % MT SOLN
15.0000 mL | Freq: Once | OROMUCOSAL | Status: AC
  Administered 2023-12-13: 15 mL via OROMUCOSAL

## 2023-12-13 MED ORDER — DROPERIDOL 2.5 MG/ML IJ SOLN: 0.6250 mg | Freq: Once | INTRAMUSCULAR | Status: DC | PRN

## 2023-12-13 MED ORDER — PROPOFOL 10 MG/ML IV BOLUS
INTRAVENOUS | Status: AC
Start: 2023-12-13 — End: 2023-12-13
  Filled 2023-12-13: qty 20

## 2023-12-13 MED ORDER — FENTANYL CITRATE PF 50 MCG/ML IJ SOSY: 25.0000 ug | PREFILLED_SYRINGE | INTRAMUSCULAR | Status: DC | PRN

## 2023-12-13 MED ORDER — GABAPENTIN 300 MG PO CAPS
300.0000 mg | ORAL_CAPSULE | ORAL | Status: AC
  Administered 2023-12-13: 300 mg via ORAL
  Filled 2023-12-13: qty 1

## 2023-12-13 MED ORDER — OXYCODONE HCL 5 MG PO TABS
5.0000 mg | ORAL_TABLET | Freq: Three times a day (TID) | ORAL | 0 refills | Status: AC | PRN
  Filled 2023-12-13: qty 15, 5d supply, fill #0

## 2023-12-13 MED ORDER — FENTANYL CITRATE (PF) 100 MCG/2ML IJ SOLN
INTRAMUSCULAR | Status: AC
Start: 2023-12-13 — End: 2023-12-13
  Filled 2023-12-13: qty 2

## 2023-12-13 MED ORDER — LIDOCAINE HCL (PF) 2 % IJ SOLN
INTRAMUSCULAR | Status: DC | PRN
  Administered 2023-12-13: 60 mg via INTRADERMAL

## 2023-12-13 MED ORDER — BUPIVACAINE LIPOSOME 1.3 % IJ SUSP: 20.0000 mL | Freq: Once | INTRAMUSCULAR | Status: DC

## 2023-12-13 MED ORDER — KETOROLAC TROMETHAMINE 30 MG/ML IJ SOLN
INTRAMUSCULAR | Status: DC | PRN
  Administered 2023-12-13: 15 mg via INTRAVENOUS

## 2023-12-13 MED ORDER — DEXAMETHASONE SODIUM PHOSPHATE 10 MG/ML IJ SOLN
INTRAMUSCULAR | Status: DC | PRN
  Administered 2023-12-13: 5 mg via INTRAVENOUS

## 2023-12-13 MED ORDER — BUPIVACAINE LIPOSOME 1.3 % IJ SUSP
INTRAMUSCULAR | Status: DC | PRN
  Administered 2023-12-13: 20 mL

## 2023-12-13 MED ORDER — EPHEDRINE SULFATE (PRESSORS) 50 MG/ML IJ SOLN
INTRAMUSCULAR | Status: DC | PRN
Start: 2023-12-13 — End: 2023-12-13
  Administered 2023-12-13: 10 mg via INTRAVENOUS

## 2023-12-13 SURGICAL SUPPLY — 30 items
BAG COUNTER SPONGE SURGICOUNT (BAG) IMPLANT
BENZOIN TINCTURE PRP APPL 2/3 (GAUZE/BANDAGES/DRESSINGS) ×1 IMPLANT
BLADE SURG 15 STRL LF DISP TIS (BLADE) ×1 IMPLANT
CHLORAPREP W/TINT 26 (MISCELLANEOUS) ×1 IMPLANT
CLSR STERI-STRIP ANTIMIC 1/2X4 (GAUZE/BANDAGES/DRESSINGS) IMPLANT
COVER SURGICAL LIGHT HANDLE (MISCELLANEOUS) ×1 IMPLANT
DRAIN PENROSE 0.5X18 (DRAIN) ×1 IMPLANT
DRAPE LAPAROSCOPIC ABDOMINAL (DRAPES) ×1 IMPLANT
ELECT REM PT RETURN 15FT ADLT (MISCELLANEOUS) ×1 IMPLANT
GAUZE SPONGE 4X4 12PLY STRL (GAUZE/BANDAGES/DRESSINGS) IMPLANT
GAUZE SPONGE 4X4 12PLY STRL LF (GAUZE/BANDAGES/DRESSINGS) IMPLANT
GLOVE BIO SURGEON STRL SZ 6 (GLOVE) ×1 IMPLANT
GLOVE INDICATOR 6.5 STRL GRN (GLOVE) ×1 IMPLANT
GOWN STRL REUS W/ TWL LRG LVL3 (GOWN DISPOSABLE) ×1 IMPLANT
KIT BASIN OR (CUSTOM PROCEDURE TRAY) ×1 IMPLANT
KIT TURNOVER KIT A (KITS) ×1 IMPLANT
MARKER SKIN DUAL TIP RULER LAB (MISCELLANEOUS) ×1 IMPLANT
MESH OVITEX 1S PERM 6X10 6L (Mesh General) IMPLANT
NDL HYPO 22X1.5 SAFETY MO (MISCELLANEOUS) ×1 IMPLANT
NEEDLE HYPO 22X1.5 SAFETY MO (MISCELLANEOUS) ×1 IMPLANT
PACK GENERAL/GYN (CUSTOM PROCEDURE TRAY) ×1 IMPLANT
SPIKE FLUID TRANSFER (MISCELLANEOUS) ×1 IMPLANT
STRIP CLOSURE SKIN 1/2X4 (GAUZE/BANDAGES/DRESSINGS) ×1 IMPLANT
SUT ETHIBOND 0 MO6 C/R (SUTURE) ×1 IMPLANT
SUT MNCRL AB 4-0 PS2 18 (SUTURE) ×1 IMPLANT
SUT PDS AB 0 CT1 36 (SUTURE) ×2 IMPLANT
SUT VIC AB 3-0 SH 27XBRD (SUTURE) ×2 IMPLANT
SUT VICRYL 3 0 BR 18 UND (SUTURE) ×1 IMPLANT
SYR CONTROL 10ML LL (SYRINGE) ×1 IMPLANT
TOWEL OR 17X26 10 PK STRL BLUE (TOWEL DISPOSABLE) ×1 IMPLANT

## 2023-12-13 NOTE — Transfer of Care (Signed)
 Immediate Anesthesia Transfer of Care Note  Patient: Chase Martinez  Procedure(s) Performed: REPAIR, HERNIA, INGUINAL, ADULT (Right)  Patient Location: PACU  Anesthesia Type:General  Level of Consciousness: awake, alert , and oriented  Airway & Oxygen Therapy: Patient Spontanous Breathing  Post-op Assessment: Report given to RN and Post -op Vital signs reviewed and stable  Post vital signs: Reviewed and stable  Last Vitals:  Vitals Value Taken Time  BP 120/58 12/13/23 15:15  Temp 36.3 C 12/13/23 15:15  Pulse 78 12/13/23 15:18  Resp 20 12/13/23 15:18  SpO2 96 % 12/13/23 15:18  Vitals shown include unfiled device data.  Last Pain:  Vitals:   12/13/23 1515  TempSrc:   PainSc: 0-No pain         Complications: No notable events documented.

## 2023-12-13 NOTE — Op Note (Signed)
 Operative Note  ISAURO SKELLEY  [id number]  [id number]  12/13/2023   Surgeon: Mitzie DELENA Freund MD FACS   Assistant: Mae Flock MD (PGY4)  I was personally present during the key and critical portions of this procedure and immediately available throughout the entire procedure, as documented in my operative note.  Procedure performed: Open right inguinal hernia repair with mesh   Preop diagnosis:  right inguinal hernia   Post-op diagnosis/intraop findings: indirect inguinal hernia, cord lipoma   Specimens: none   EBL: 5cc   Complications: none   Description of procedure: After confirming informed consent, the patient was taken to the operating room and placed supine on operating room table where general anesthesia was initiated, preoperative antibiotics were administered, SCDs applied, and a formal timeout was performed. The groin was clipped, prepped and draped in the usual sterile fashion. An oblique incision was made the just above the inguinal ligament after infiltrating the tissues with local anesthetic (exparel  mixed with 0.25% marcaine  with epinephrine ). Soft tissues were dissected using electrocautery until the external oblique aponeurosis was encountered. This was divided sharply to expand the external ring. A plane was bluntly developed between the spermatic cord and the external oblique. The spermatic cord was then bluntly dissected away from the pubic tubercle and encircled with a Penrose. Inspection of the inguinal anatomy revealed a moderate indirect hernia sac as well as a moderate cord lipoma. The indirect hernia sac was bluntly dissected away from the cord structures and skeletonized to the level of the internal ring, where it was reduced intact into the abdomen.  The lipoma was somewhat lobulated and was skeletonized in 3 portions to the level of the internal ring where each was ligated with 3-0 Vicryl ties, excised and the ligated pedicle reduced. The inguinal floor was  reconstructed suturing the conjoint tendon to the inguinal ligament with interrupted 0 PDS, leaving an internal ring just sufficient for the cord structures. A 6x10cm ovitex 1S permanent mesh was brought onto the field and trimmed to approximate the field. This was sutured to the pubic tubercle fascia, inferior shelving edge and to the internal oblique superiorly with interrupted 0 ethibonds. The tails of the mesh were wrapped around the spermatic cord, ensuring adequate room for the cord, and sutured to each other with 0 ethibond, and then directed laterally to lie flat beneath the external oblique aponeurosis. Hemostasis was ensured within the wound. The Penrose was removed. The external oblique aponeurosis was reapproximated with a running 3-0 Vicryl to re-create a narrowed external ring. More local was infiltrated around the pubic tubercle and in the plane just below the external oblique. The Scarpa's was reapproximated with interrupted 3-0 Vicryls. The skin was closed with a running subcuticular 4-0 Monocryl. The remainder of the local was injected in the subcutaneous and subcuticular space. The field was then cleaned, benzoin and Steri-Strips and sterile bandage were applied. The patient was then awakened extubated and taken to PACU in stable condition.    All counts were correct at the completion of the case

## 2023-12-13 NOTE — Interval H&P Note (Signed)
 History and Physical Interval Note:  12/13/2023 12:50 PM  Chase Martinez  has presented today for surgery, with the diagnosis of HERNIA.  The various methods of treatment have been discussed with the patient and family. After consideration of risks, benefits and other options for treatment, the patient has consented to  Procedure(s): REPAIR, HERNIA, INGUINAL, ADULT (Right) as a surgical intervention.  The patient's history has been reviewed, patient examined, no change in status, stable for surgery.  I have reviewed the patient's chart and labs.  Questions were answered to the patient's satisfaction.     Madie Cahn DELENA Freund

## 2023-12-13 NOTE — Anesthesia Postprocedure Evaluation (Signed)
 Anesthesia Post Note  Patient: Chase Martinez  Procedure(s) Performed: REPAIR, HERNIA, INGUINAL, ADULT (Right)     Patient location during evaluation: PACU Anesthesia Type: General Level of consciousness: awake and alert Pain management: pain level controlled Vital Signs Assessment: post-procedure vital signs reviewed and stable Respiratory status: spontaneous breathing, nonlabored ventilation, respiratory function stable and patient connected to nasal cannula oxygen Cardiovascular status: blood pressure returned to baseline and stable Postop Assessment: no apparent nausea or vomiting Anesthetic complications: no   No notable events documented.  Last Vitals:  Vitals:   12/13/23 1545 12/13/23 1601  BP: 129/70 139/68  Pulse: 65 66  Resp: 14   Temp: (!) 36 C   SpO2: 97% 100%    Last Pain:  Vitals:   12/13/23 1601  TempSrc:   PainSc: 0-No pain                 Cordella P Averie Meiner

## 2023-12-13 NOTE — Discharge Instructions (Signed)
 HERNIA REPAIR: POST OP INSTRUCTIONS   EAT Gradually transition to your usual diet over the next few days after discharge.  WALK Walk an hour a day (cumulative- not all at once).  Control your pain to do that.    CONTROL PAIN Control pain so that you can walk, sleep, tolerate sneezing/coughing, and go up/down stairs.  HAVE A BOWEL MOVEMENT DAILY Keep your bowels regular to avoid problems.  OK to try a laxative to override constipation.  OK to use an antidiarrheal to slow down diarrhea.  Call if not better after 2 tries  CALL IF YOU HAVE PROBLEMS/CONCERNS Call if you are still struggling despite following these instructions. Call if you have concerns not answered by these instructions  ######################################################################    DIET: Follow a light bland diet & liquids the first 24 hours after arrival home, such as soup, liquids, starches, etc.  Be sure to drink plenty of fluids.  Quickly advance to a usual solid diet within a few days.  Avoid fast food or heavy meals initially as you are more likely to get nauseated or have irregular bowels.   Take your usually prescribed home medications unless otherwise directed.  PAIN CONTROL: Pain is best controlled by a usual combination of three different methods TOGETHER: Ice/Heat Over the counter pain medication Prescription pain medication Most patients will experience some swelling and bruising around the hernia(s) such as the bellybutton, groins, or old incisions.  Ice packs or heating pads (30-60 minutes up to 6 times a day) will help. Use ice for the first few days to help decrease swelling and bruising, then switch to heat to help relax tight/sore spots and speed recovery.  Some people prefer to use ice alone, heat alone, alternating between ice & heat.  Experiment to what works for you.  Swelling and bruising can take several weeks to resolve.   It is helpful to take an over-the-counter pain medication  regularly for the first days: Naproxen (Aleve, etc)  Two 220mg  tabs twice a day OR Ibuprofen (Advil, etc) Three 200mg  tabs four times a day (every meal & bedtime) AND Acetaminophen  (Tylenol , etc) 325-650mg  four times a day (every meal & bedtime) A  prescription for pain medication should be given to you upon discharge.  Take your pain medication as prescribed, IF NEEDED.  If you are having problems/concerns with the prescription medicine (does not control pain, nausea, vomiting, rash, itching, etc), please call us  [phone] to see if we need to switch you to a different pain medicine that will work better for you and/or control your side effect better. If you need a refill on your pain medication, please contact your pharmacy.  They will contact our office to request authorization. Prescriptions will not be filled after 5 pm or on week-ends.  Avoid getting constipated.  Between the surgery and the pain medications, it is common to experience some constipation.  Increasing fluid intake and taking a fiber supplement (such as Metamucil, Citrucel, FiberCon, MiraLax, etc) 1-2 times a day regularly will usually help prevent this problem from occurring.  A mild laxative (prune juice, Milk of Magnesia, MiraLax, etc) should be taken according to package directions if there are no bowel movements after 48 hours.    Wash / shower every day, starting 2 days after surgery.  You may shower over the steri strips or skin glue which are waterproof.  No rubbing, scrubbing, lotions or ointments to incision(s). Do not soak or submerge incision.   Remove your outer  bandage (gauze and tape) 2 days after surgery. Steri strips (small white tapes directly on incision) will peel off after 1-2 weeks.  You may leave the incision open to air.  You may replace a dressing/Band-Aid to cover an incision for comfort if you wish.  Continue to shower over incision(s) after the dressing is off.  ACTIVITIES as tolerated:   You may  resume regular (light) daily activities beginning the next day--such as daily self-care, walking, climbing stairs--gradually increasing activities as tolerated.  Control your pain so that you can walk an hour a day.  If you can walk 30 minutes without difficulty, it is safe to try more intense activity such as jogging, treadmill, bicycling, low-impact aerobics, swimming, etc. Refrain from the most intensive and strenuous activity such as sit-ups, heavy lifting, contact sports, etc  Refrain from any heavy lifting or straining until 6 weeks after surgery.   DO NOT PUSH THROUGH PAIN.  Let pain be your guide: If it hurts to do something, don't do it.  Pain is your body warning you to avoid that activity for another week until the pain goes down. You may drive when you are no longer taking prescription pain medication, you can comfortably wear a seatbelt, and you can safely maneuver your car and apply brakes. You may have sexual intercourse when it is comfortable.   FOLLOW UP in our office Please call [facility] at [phone] to set up an appointment to see your surgeon in the office for a follow-up appointment approximately 2-3 weeks after your surgery. Make sure that you call for this appointment the day you arrive home to insure a convenient appointment time.  9.  If you have disability of FMLA / Family leave forms, please bring the forms to the office for processing.  (do not give to your surgeon).  WHEN TO CALL US  [phone]: Poor pain control Reactions / problems with new medications (rash/itching, nausea, etc)  Fever over 101.5 F (38.5 C) Inability to urinate Nausea and/or vomiting Worsening swelling or bruising Continued bleeding from incision. Increased pain, redness, or drainage from the incision   The clinic staff is available to answer your questions during regular business hours (8:30am-5pm).  Please don't hesitate to call and ask to speak to one of our nurses for clinical concerns.    If you have a medical emergency, go to the nearest emergency room or call 911.  A surgeon from [facility] is always on call at the hospitals in [facility], [place] [address], [address], [place], [place]  [address] ?  [address] [address], [place], [place]   [address] MAIN: [phone] ? TOLL FREE: [phone] ? FAX: [phone] [email or web address]

## 2023-12-13 NOTE — Anesthesia Procedure Notes (Signed)
 Procedure Name: Intubation Date/Time: 12/13/2023 1:35 PM  Performed by: Delores Duwaine SAUNDERS, CRNAPre-anesthesia Checklist: Patient identified, Emergency Drugs available, Suction available and Patient being monitored Patient Re-evaluated:Patient Re-evaluated prior to induction Oxygen Delivery Method: Circle System Utilized Preoxygenation: Pre-oxygenation with 100% oxygen Induction Type: IV induction Ventilation: Mask ventilation without difficulty Laryngoscope Size: Mac and 4 Grade View: Grade I Tube type: Oral Tube size: 7.5 mm Number of attempts: 1 Airway Equipment and Method: Stylet and Oral airway Placement Confirmation: ETT inserted through vocal cords under direct vision, positive ETCO2 and breath sounds checked- equal and bilateral Secured at: 23 cm Tube secured with: Tape Dental Injury: Teeth and Oropharynx as per pre-operative assessment

## 2023-12-14 ENCOUNTER — Encounter (HOSPITAL_COMMUNITY): Payer: Self-pay | Admitting: Surgery

## 2023-12-15 ENCOUNTER — Emergency Department (HOSPITAL_COMMUNITY)
Admission: EM | Admit: 2023-12-15 | Discharge: 2023-12-15 | Disposition: A | Discharge status: Home / Self Care | Attending: Emergency Medicine | Admitting: Emergency Medicine

## 2023-12-15 ENCOUNTER — Other Ambulatory Visit: Payer: Self-pay

## 2023-12-15 ENCOUNTER — Emergency Department (HOSPITAL_COMMUNITY)

## 2023-12-15 ENCOUNTER — Ambulatory Visit
Admission: RE | Admit: 2023-12-15 | Discharge: 2023-12-15 | Disposition: A | Discharge status: Home / Self Care | Source: Ambulatory Visit | Attending: Internal Medicine | Admitting: Internal Medicine

## 2023-12-15 VITALS — BP 155/77 | HR 95 | Temp 97.7°F | Resp 16

## 2023-12-15 DIAGNOSIS — N433 Hydrocele, unspecified: Secondary | ICD-10-CM | POA: Diagnosis not present

## 2023-12-15 DIAGNOSIS — Z9889 Other specified postprocedural states: Secondary | ICD-10-CM | POA: Diagnosis not present

## 2023-12-15 DIAGNOSIS — I10 Essential (primary) hypertension: Secondary | ICD-10-CM | POA: Insufficient documentation

## 2023-12-15 DIAGNOSIS — N503 Cyst of epididymis: Secondary | ICD-10-CM | POA: Diagnosis not present

## 2023-12-15 DIAGNOSIS — E119 Type 2 diabetes mellitus without complications: Secondary | ICD-10-CM | POA: Insufficient documentation

## 2023-12-15 DIAGNOSIS — R350 Frequency of micturition: Secondary | ICD-10-CM | POA: Diagnosis present

## 2023-12-15 DIAGNOSIS — Z7984 Long term (current) use of oral hypoglycemic drugs: Secondary | ICD-10-CM | POA: Diagnosis not present

## 2023-12-15 DIAGNOSIS — N50819 Testicular pain, unspecified: Secondary | ICD-10-CM

## 2023-12-15 DIAGNOSIS — N5089 Other specified disorders of the male genital organs: Secondary | ICD-10-CM | POA: Diagnosis not present

## 2023-12-15 LAB — COMPREHENSIVE METABOLIC PANEL WITH GFR
ALT: 28 U/L (ref 0–44)
AST: 36 U/L (ref 15–41)
Albumin: 4.1 g/dL (ref 3.5–5.0)
Alkaline Phosphatase: 57 U/L (ref 38–126)
Anion gap: 11 (ref 5–15)
BUN: 20 mg/dL (ref 8–23)
CO2: 26 mmol/L (ref 22–32)
Calcium: 10 mg/dL (ref 8.9–10.3)
Chloride: 100 mmol/L (ref 98–111)
Creatinine, Ser: 0.93 mg/dL (ref 0.61–1.24)
GFR, Estimated: >60 mL/min (ref >60)
Glucose, Bld: 120 mg/dL — ABNORMAL HIGH (ref 70–99)
Potassium: 4.2 mmol/L (ref 3.5–5.1)
Sodium: 137 mmol/L (ref 135–145)
Total Bilirubin: 0.7 mg/dL (ref 0.0–1.2)
Total Protein: 7.8 g/dL (ref 6.5–8.1)

## 2023-12-15 LAB — CBC WITH DIFFERENTIAL/PLATELET
Abs Immature Granulocytes: 0.03 K/uL (ref 0.00–0.07)
Basophils Absolute: 0 K/uL (ref 0.0–0.1)
Basophils Relative: 0 %
Eosinophils Absolute: 0.1 K/uL (ref 0.0–0.5)
Eosinophils Relative: 1 %
HCT: 36.7 % — ABNORMAL LOW (ref 39.0–52.0)
Hemoglobin: 11.9 g/dL — ABNORMAL LOW (ref 13.0–17.0)
Immature Granulocytes: 0 %
Lymphocytes Relative: 9 %
Lymphs Abs: 0.9 K/uL (ref 0.7–4.0)
MCH: 31.9 pg (ref 26.0–34.0)
MCHC: 32.4 g/dL (ref 30.0–36.0)
MCV: 98.4 fL (ref 80.0–100.0)
Monocytes Absolute: 0.9 K/uL (ref 0.1–1.0)
Monocytes Relative: 9 %
Neutro Abs: 7.9 K/uL — ABNORMAL HIGH (ref 1.7–7.7)
Neutrophils Relative %: 81 %
Platelets: 274 K/uL (ref 150–400)
RBC: 3.73 MIL/uL — ABNORMAL LOW (ref 4.22–5.81)
RDW: 13.5 % (ref 11.5–15.5)
WBC: 9.7 K/uL (ref 4.0–10.5)
nRBC: 0 % (ref 0.0–0.2)

## 2023-12-15 LAB — URINALYSIS, ROUTINE W REFLEX MICROSCOPIC
Bilirubin Urine: NEGATIVE
Glucose, UA: NEGATIVE mg/dL
Hgb urine dipstick: NEGATIVE
Ketones, ur: NEGATIVE mg/dL
Leukocytes,Ua: NEGATIVE
Nitrite: NEGATIVE
Protein, ur: NEGATIVE mg/dL
Specific Gravity, Urine: 1.006 (ref 1.005–1.030)
pH: 5 (ref 5.0–8.0)

## 2023-12-15 LAB — LIPASE, BLOOD: Lipase: 39 U/L (ref 11–51)

## 2023-12-15 NOTE — ED Provider Triage Note (Signed)
 Emergency Medicine Provider Triage Evaluation Note  Chase Martinez , a 83 y.o. male  was evaluated in triage.  Pt complains of scrotal swelling post-op inquinal hernia repair, no pain or fever, reddness.  Review of Systems  Positive: Scrotal swelling Negative: pain  Physical Exam  BP (!) 148/76   Pulse 88   Temp 97.9 F (36.6 C)   Resp 18   SpO2 99%  Gen:   Awake, no distress   Resp:  Normal effort  MSK:   Moves extremities without difficulty  Other:    Medical Decision Making  Medically screening exam initiated at 4:48 PM.  Appropriate orders placed.  Chase Martinez was informed that the remainder of the evaluation will be completed by another provider, this initial triage assessment does not replace that evaluation, and the importance of remaining in the ED until their evaluation is complete.     Chase Martinez, [place] 12/15/23 1648

## 2023-12-15 NOTE — Discharge Instructions (Addendum)
 Please reach out to your surgeon to see if they have any additional recommendations. As we discussed you have what is called a hydrocele which is a collection of fluid in the scrotum. This is often seen after a hernia repair. You can try to keep the scrotum elevated, and apply ice up to 15 minutes at a time up to twice an hour. You can use your home pain medication as needed.

## 2023-12-15 NOTE — ED Notes (Signed)
 Patient is being discharged from the Urgent Care and sent to the Emergency Department via POV . Per Billy, PA-C, patient is in need of higher level of care due to need for further evaluation of possible inguinal hernia surgical complications. Patient is aware and verbalizes understanding of plan of care.  Vitals:   12/15/23 1551 12/15/23 1555  BP: (!) 166/80 (!) 155/77  Pulse: 95   Resp: 16   Temp: 97.7 F (36.5 C)   SpO2: 96%

## 2023-12-15 NOTE — ED Triage Notes (Signed)
 Pt reports feeling like penis is retracted inward and having increased frequency since yesterday evening. He notes having a hernia surgery on 7/22 to R-sided groin area. Denies pain. Mild soreness to surgical site

## 2023-12-15 NOTE — ED Triage Notes (Signed)
 Patient c/o groin swelling started yesterday. Patient report he had hernia repair 2 days ago. Patient denies pain at the post op site. Patient denies fever. Patient denies N/V.

## 2023-12-15 NOTE — ED Provider Notes (Signed)
 [place] EMERGENCY DEPARTMENT AT [facility] Provider Note   CSN: [id number] Arrival date & time: 12/15/23  1625     Patient presents with: Groin Swelling   Chase Martinez is a 83 y.o. male with past medical history significant for hypertension, diabetes, hyperlipidemia who presents with worsening groin swelling since yesterday.  He had hernia repair 2 days ago.  No significant pain at postop site.  Denies fever.  Does endorse some frequent urination.   HPI     Prior to Admission medications   Medication Sig Start Date End Date Taking? Authorizing Provider  Apoaequorin 10 MG CAPS Take 10 mg by mouth daily.    [provider]  Ascorbic Acid (VITAMIN C) 1000 MG tablet Take 1,000 mg by mouth in the morning. 08/14/18   [provider]  atorvastatin (LIPITOR) 20 MG tablet Take 1 tablet (20 mg total) by mouth daily. Patient taking differently: Take 20 mg by mouth every evening. 06/27/23   Paz, Jose E, MD  Black Elderberry 1000 MG CAPS Take 1,000 mg by mouth every evening.    [provider]  Cholecalciferol (VITAMIN D3) 50 MCG (2000 UT) capsule Take 2,000 Units by mouth daily.  08/14/18   [provider]  docusate sodium  (COLACE) 100 MG capsule Take 1 capsule (100 mg total) by mouth 2 (two) times daily. Okay to decrease to once daily or stop taking if having loose bowel movements 12/13/23 01/12/24  Signe Mitzie LABOR, MD  finasteride (PROSCAR) 5 MG tablet Take 5 mg by mouth in the morning. 10/26/22   [provider]  furosemide  (LASIX ) 20 MG tablet Take 1 tablet (20 mg total) by mouth daily. 10/11/23   Amon Aloysius BRAVO, MD  glucose blood (FREESTYLE LITE) test strip USE TO CHECK BLOOD SUGAR, NOT MORE THAN TWICE A DAY 04/25/23   Amon Aloysius BRAVO, MD  Lancets (FREESTYLE) lancets Check blood sugars no more than twice daily 04/25/23   Paz, Jose E, MD  metFORMIN  (GLUCOPHAGE ) 1000 MG tablet Take 1 tablet (1,000 mg total) by mouth 2 (two) times daily with a meal.  07/07/23   Paz, Jose E, MD  multivitamin [facility]) per tablet Take 1 tablet by mouth daily.    [provider]  OFEV  150 MG CAPS TAKE 1 CAPSULE TWICE DAILY (12 HOURS APART) WITH FOOD. Patient not taking: Reported on 12/07/2023 11/07/23   Mannam, Praveen, MD  Omega-3 Fatty Acids (FISH OIL) 1000 MG CAPS Take 1,000 mg by mouth in the morning.    [provider]  oxyCODONE  (ROXICODONE ) 5 MG immediate release tablet Take 1 tablet (5 mg total) by mouth every 8 (eight) hours as needed for up to 5 days. Alternate tylenol  and ibuprofen for the first few days. Take narcotic pain medication only if needed for severe/ breakthrough pain. 12/13/23 12/18/23  Signe Mitzie LABOR, MD  pioglitazone  (ACTOS ) 30 MG tablet Take 1 tablet (30 mg total) by mouth daily. Patient taking differently: Take 30 mg by mouth every evening. 06/27/23   Amon Aloysius BRAVO, MD  sitaGLIPtin  (JANUVIA ) 100 MG tablet Take 1 tablet (100 mg total) by mouth daily. 11/09/23   Amon Aloysius BRAVO, MD  tamsulosin  (FLOMAX ) 0.4 MG CAPS capsule Take 1 capsule (0.4 mg total) by mouth in the morning and at bedtime. 12/13/22   Amon Aloysius BRAVO, MD    Allergies: Patient has no known allergies.    Review of Systems  All other systems reviewed and are negative.   Updated Vital  Signs BP (!) 161/79   Pulse 77   Temp 97.7 F (36.5 C)   Resp 18   SpO2 99%   Physical Exam Vitals and nursing note reviewed.  Constitutional:      General: He is not in acute distress.    Appearance: Normal appearance.  HENT:     Head: Normocephalic and atraumatic.  Eyes:     General:        Right eye: No discharge.        Left eye: No discharge.  Cardiovascular:     Rate and Rhythm: Normal rate and regular rhythm.     Heart sounds: No murmur heard.    No friction rub. No gallop.  Pulmonary:     Effort: Pulmonary effort is normal.     Breath sounds: Normal breath sounds.  Abdominal:     General: Bowel sounds are normal.     Palpations: Abdomen is soft.   Genitourinary:    Comments: Patient with some bruising, what appears to be dependent edema of the scrotum, penis, no significant pain, no induration, no warmth, no paraphimosis noted. Skin:    General: Skin is warm and dry.     Capillary Refill: Capillary refill takes less than 2 seconds.  Neurological:     Mental Status: He is alert and oriented to person, place, and time.  Psychiatric:        Mood and Affect: Mood normal.        Behavior: Behavior normal.     (all labs ordered are listed, but only abnormal results are displayed) Labs Reviewed  COMPREHENSIVE METABOLIC PANEL WITH GFR - Abnormal; Notable for the following components:      Result Value   Glucose, Bld 120 (*)    All other components within normal limits  CBC WITH DIFFERENTIAL/PLATELET - Abnormal; Notable for the following components:   RBC 3.73 (*)    Hemoglobin 11.9 (*)    HCT 36.7 (*)    Neutro Abs 7.9 (*)    All other components within normal limits  URINALYSIS, ROUTINE W REFLEX MICROSCOPIC - Abnormal; Notable for the following components:   Color, Urine STRAW (*)    All other components within normal limits  LIPASE, BLOOD    EKG: None  Radiology: US  SCROTUM W/DOPPLER Result Date: 12/15/2023 EXAM: ULTRASOUND SCROTUM/TESTICLES WITH DOPPLER FLOW EVALUATION 12/15/2023 10:20:00 PM TECHNIQUE: Duplex ultrasound using B-mode/gray scaled imaging, Doppler spectral analysis and color flow Doppler was obtained of the testicles. COMPARISON: None available. CLINICAL HISTORY: Scrotal swelling, post-op. FINDINGS: RIGHT: MEASUREMENTS: Right testis measures 3.6 x 3.2 x 2.8 cm. GREY SCALE: The right testicle demonstrates normal homogeneous echotexture without focal lesion. No testicular microlithiasis. DOPPLER EVALUATION: There is normal arterial and venous Doppler flow within the testicle. VARICOCELES: No scrotal varicoceles. SCROTAL SAC: Moderate right hydrocele with layering debris. EPIDIDYMIS: 1.5 cm right epididymal cyst.  LEFT: MEASUREMENTS: Left testis measures 3.4 x 2.8 x 2.9 cm. GREY SCALE: The left testicle demonstrates normal homogeneous echotexture without focal lesion. No testicular microlithiasis. DOPPLER EVALUATION: There is normal arterial and venous Doppler flow within the testicle. VARICOCELES: No scrotal varicoceles. SCROTAL SAC: Moderate left hydrocele with layering debris. EPIDIDYMIS: No acute abnormality. No sonographic abnormality in the right inguinal region. IMPRESSION: 1. No testicular torsion. 2. Moderate bilateral hydrocele with layering debris. 3. 1.5 cm right epididymal cyst. 4. No sonographic abnormality in the right inguinal region. Electronically signed by: Pinkie Pebbles MD 12/15/2023 10:37 PM EDT RP Workstation: [id number]  Procedures   Medications Ordered in the ED - No data to display                                  Medical Decision Making  This patient is a 83 y.o. male  who presents to the ED for concern of scrotal swelling.   Differential diagnoses prior to evaluation: The emergent differential diagnosis includes, but is not limited to,  hydrocele, varicocele, testicular torsion, epididymitis, epididymal appendage injury, inguinal hernia, traumatic injury of testicle, STI, inguinal lymphadenopathy versus other -- overall suspect dependent edema post operatively . This is not an exhaustive differential.   Past Medical History / Co-morbidities / Social History:  hypertension, diabetes, hyperlipidemia   Additional history: Chart reviewed. Pertinent results include: reviewed op note from 7/22 from hernia repair  Physical Exam: Physical exam performed. The pertinent findings include: Patient with some bruising, what appears to be dependent edema of the scrotum, penis, no significant pain, no induration, no warmth, no paraphimosis noted.  Lab Tests/Imaging studies: I personally interpreted labs/imaging and the pertinent results include: CBC for mild anemia, hemoglobin  11.9, UA unremarkable, CMP overall unremarkable, lipase unremarkable, ultrasound of the scrotum shows bilateral hydrocele with some layering debris, no acute postop complication. I agree with the radiologist interpretation.   Disposition: After consideration of the diagnostic results and the patients response to treatment, I feel that patient is stable for discharge with plan as above .   emergency department workup does not suggest an emergent condition requiring admission or immediate intervention beyond what has been performed at this time. The plan is: as above. The patient is safe for discharge and has been instructed to return immediately for worsening symptoms, change in symptoms or any other concerns.   Final diagnoses:  Hydrocele, unspecified hydrocele type    ED Discharge Orders     None          Rosan Sherlean VEAR DEVONNA 12/15/23 2253    Patt Alm Macho, MD 12/16/23 1550

## 2023-12-15 NOTE — ED Provider Notes (Signed)
 Patient here for evaluation of penile issues and increased urination after inguinal surgery repair 48 hours ago. Recommended further evaluation in the ED. Patient is agreeable with plan.    Billy Asberry FALCON, PA-C 12/15/23 1558

## 2023-12-22 ENCOUNTER — Ambulatory Visit: Admitting: Pulmonary Disease

## 2023-12-22 ENCOUNTER — Encounter: Payer: Self-pay | Admitting: Pulmonary Disease

## 2023-12-22 ENCOUNTER — Other Ambulatory Visit: Payer: Self-pay | Admitting: Internal Medicine

## 2023-12-22 VITALS — BP 120/64 | HR 69 | Temp 97.8°F | Ht 72.0 in | Wt 186.0 lb

## 2023-12-22 DIAGNOSIS — M2021 Hallux rigidus, right foot: Secondary | ICD-10-CM | POA: Diagnosis not present

## 2023-12-22 DIAGNOSIS — Z5181 Encounter for therapeutic drug level monitoring: Secondary | ICD-10-CM | POA: Diagnosis not present

## 2023-12-22 DIAGNOSIS — L603 Nail dystrophy: Secondary | ICD-10-CM | POA: Diagnosis not present

## 2023-12-22 DIAGNOSIS — E1151 Type 2 diabetes mellitus with diabetic peripheral angiopathy without gangrene: Secondary | ICD-10-CM | POA: Diagnosis not present

## 2023-12-22 DIAGNOSIS — I739 Peripheral vascular disease, unspecified: Secondary | ICD-10-CM | POA: Diagnosis not present

## 2023-12-22 DIAGNOSIS — M2022 Hallux rigidus, left foot: Secondary | ICD-10-CM | POA: Diagnosis not present

## 2023-12-22 DIAGNOSIS — M2041 Other hammer toe(s) (acquired), right foot: Secondary | ICD-10-CM | POA: Diagnosis not present

## 2023-12-22 DIAGNOSIS — L84 Corns and callosities: Secondary | ICD-10-CM | POA: Diagnosis not present

## 2023-12-22 DIAGNOSIS — J849 Interstitial pulmonary disease, unspecified: Secondary | ICD-10-CM

## 2023-12-22 DIAGNOSIS — L851 Acquired keratosis [keratoderma] palmaris et plantaris: Secondary | ICD-10-CM | POA: Diagnosis not present

## 2023-12-22 DIAGNOSIS — B351 Tinea unguium: Secondary | ICD-10-CM | POA: Diagnosis not present

## 2023-12-22 MED ORDER — OFEV 100 MG PO CAPS: 100.0000 mg | ORAL_CAPSULE | Freq: Two times a day (BID) | ORAL | 1 refills | Status: AC

## 2023-12-22 NOTE — Progress Notes (Addendum)
 Chase Martinez    [id number]    [birth year]  Primary Care Physician:Paz, Aloysius BRAVO, MD  Referring Physician: Amon Aloysius BRAVO, MD [address] [place],  [place] [address]  Chief complaint:  Follow-up for asbestosis, progressive pulmonary fibrosis Started nintedanib in September 2024  HPI:  83 y.o. with history of asbestos exposure, hypertension, hyperlipidemia, diabetes.  He is here for evaluation of asbestos-related lung disease.  Reports significant asbestos exposure while working in [employer] Denies any pulmonary symptoms.  No dyspnea, cough, sputum production, wheezing.  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  No mold, hot tub, Jacuzzi. Smoking history: 20-pack-year smoking history.  Quit in 1968 Travel history: Lived in [place] , [place] , [place] .  Recent travel to [place] . Relevant family history: No significant family history of lung issues.  Interim history: The patient, with pulmonary fibrosis, presents with issues related to medication side effects and recent hernia repair.  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; [facility] - Started Ofev  in September 2024 for pulmonary fibrosis - Experiencing diarrhea attributed to Ofev ; initially managed side effects but has had increased difficulty since mid-June 2025 due to personal stressors - Ofev  taken inconsistently since mid-June 2025 due to side effects and personal conflicts - No current breathing problems - Able to walk eighteen holes of golf when playing - Has not played golf since December 10, 2023  Postoperative status - hernia repair - Underwent hernia repair on December 13, 2023, at [facility] under general anesthesia -  Discharged approximately two hours post-procedure - Experiencing soreness but no significant pain following surgery  Outpatient Encounter Medications as of 12/22/2023  Medication Sig   Apoaequorin 10 MG CAPS Take 10 mg by mouth daily.   Ascorbic Acid (VITAMIN C) 1000 MG tablet Take 1,000 mg by mouth in the morning.   atorvastatin (LIPITOR) 20 MG tablet Take 1 tablet (20 mg total) by mouth daily.   Black Elderberry 1000 MG CAPS Take 1,000 mg by mouth every evening.   Cholecalciferol (VITAMIN D3) 50 MCG (2000 UT) capsule Take 2,000 Units by mouth daily.    docusate sodium  (COLACE) 100 MG capsule Take 1 capsule (100 mg total) by mouth 2 (two) times daily. Okay to decrease to once daily or stop taking if having loose bowel movements   finasteride (PROSCAR) 5 MG tablet Take 5 mg by mouth in the morning.   furosemide  (LASIX ) 20 MG tablet Take 1 tablet (20 mg total) by mouth daily.   glucose blood (FREESTYLE LITE) test strip USE TO CHECK BLOOD SUGAR, NOT MORE THAN TWICE A DAY   Lancets (FREESTYLE) lancets Check blood sugars no more than twice daily   metFORMIN  (GLUCOPHAGE ) 1000 MG tablet Take 1 tablet (1,000 mg total) by mouth 2 (two) times daily with a meal.   multivitamin (THERAGRAN) per tablet Take 1 tablet by mouth daily.   Omega-3 Fatty Acids (FISH OIL) 1000 MG CAPS Take 1,000 mg by mouth in the morning.   pioglitazone  (ACTOS ) 30 MG tablet Take 1 tablet (30 mg total) by mouth daily.   sitaGLIPtin  (JANUVIA ) 100 MG tablet Take 1 tablet (100 mg total) by mouth daily.   tamsulosin  (FLOMAX ) 0.4 MG CAPS capsule Take  1 capsule (0.4 mg total) by mouth in the morning and at bedtime.   OFEV  150 MG CAPS TAKE 1 CAPSULE TWICE DAILY (12 HOURS APART) WITH FOOD. (Patient not taking: Reported on 12/22/2023)   [DISCONTINUED] atorvastatin (LIPITOR) 20 MG tablet Take 1 tablet (20 mg total) by mouth daily. (Patient taking differently: Take 20 mg by mouth every evening.)   [DISCONTINUED] pioglitazone  (ACTOS ) 30 MG  tablet Take 1 tablet (30 mg total) by mouth daily. (Patient taking differently: Take 30 mg by mouth every evening.)   No facility-administered encounter medications on file as of 12/22/2023.   Physical Exam: Blood pressure 120/64, pulse 69, temperature 97.8 F (36.6 C), temperature source Oral, height 6' (1.829 m), weight 186 lb (84.4 kg), SpO2 97%. Gen:      No acute distress HEENT:  EOMI, sclera anicteric Neck:     No masses; no thyromegaly Lungs:    Clear to auscultation bilaterally; normal respiratory effort CV:         Regular rate and rhythm; no murmurs Abd:      + bowel sounds; soft, non-tender; no palpable masses, no distension Ext:    No edema; adequate peripheral perfusion Neuro: alert and oriented x 3 Psych: normal mood and affect   Data Reviewed: Imaging: CT chest 07/10/2009- dependent bibasilar atelectasis.  High-resolution CT 10/20/17- minimal septal thickening, subpleural reticulation at the bases.  Mild air trapping.  Tiny calcified granulomas, small pleural plaques Hepatic steatosis, coronary atherosclerosis.  High-resolution CT 12/01/2021-mild subpleural reticulation and traction bronchiectasis.  Indeterminate for UIP.  Stable compared to 2019.  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 I reviewed the images personally.  PFTs  11/17/2017 FVC 4.21 [92%), FEV1 3.43 [104%), F/F 81, TLC 91%, DLCO 75%, DLCO/VA 90% Minimal diffusion defect  12/16/2020 FVC 4.41 [98%], FEV1 3.68 [116%], F/F 83, TLC 7.56 [98%], DLCO 36.15 [136%] Increased diffusion capacity.  Improvement in lung volumes and diffusion capacity compared to 2019  01/25/2023 FVC 4.32 [102%], FEV1 3.54 [119%], F/F82, TLC 7.03 [94%], DLCO 28.35 [111%] Normal pulmonary function test  Assessment:  Asbestosis. Review of CT scan shows minimal scarring at the bases and minimal diffusion defect consistent with asbestosis.   His condition is more likely than not the result of his exposure during [employer] service  He does have enlarged PA but no symptoms of pulmonary hypertension or cor pulmonale PFTs in 2022 actually show an improvement in lung capacity and diffusion He is asymptomatic and maintaining an active lifestyle and we will continue monitoring him  CT shows slight interval progression of his pulmonary fibrosis though PFTs are normal.  Given change in CT scan we discussed antifibrotic therapy and have decided to initiate Ofev  in Sept 2024.  He is experiencing significant diarrhea, exacerbated by recent personal conflicts and responsibilities, including caring for his wife in palliative care. Diarrhea impacts his ability to manage daily activities and commitments. Ofev -induced diarrhea is a known side effect, and he has been taking the medication inconsistently due to these issues. Discussed alternative medication, pirfenidone, which may cause stomach upset but is less likely to cause diarrhea. Decision made to first attempt dose reduction of Ofev  to manage side effects before considering medication switch. - Reduce Ofev  dose and monitor for improvement in diarrhea symptoms. - Instruct to message if diarrhea persists after dose reduction. - Consider switching to pirfenidone if diarrhea remains problematic after dose reduction. - Instruct to take a two-week break from Ofev  before  starting the reduced dose. - Hepatic panel last week 7/24 are normal   Health maintenance 03/02/2017-influenza 10/30/2013-Prevnar 08/05/2016-Pneumovax  Plan/Recommendations: Continue Ofev  after break with a lower dose Continue to follow hepatic panel  Lonna Coder MD [facility] 12/22/2023, 3:55 PM  CC: Amon Aloysius BRAVO, MD

## 2023-12-22 NOTE — Progress Notes (Signed)
 Rx for Ofev  100mg  twice daily sent to [facility] Phone: [phone]  LFTs on 12/15/2023 wnl  Sherry Pennant, PharmD, MPH, BCPS, CPP Clinical Pharmacist (Rheumatology and Pulmonology)

## 2023-12-22 NOTE — Patient Instructions (Signed)
 VISIT SUMMARY:  You were seen today for issues related to your pulmonary fibrosis and recent hernia repair. We discussed the side effects you are experiencing from your current medication, Ofev , and your recovery from hernia surgery.  YOUR PLAN:  -PROGRESSIVE PULMONARY FIBROSIS WITH OFEV  (NINTEDANIB)-INDUCED DIARRHEA: Progressive pulmonary fibrosis is a lung disease that causes scarring of the lungs, making it difficult to breathe. You have been experiencing significant diarrhea as a side effect of your medication, Ofev . We discussed reducing the dose of Ofev  to see if it helps with the diarrhea. If the diarrhea persists, we may consider switching to another medication, pirfenidone, which is less likely to cause diarrhea but may cause stomach upset. Please take a two-week break from Ofev  before starting the reduced dose and let us  know if the diarrhea continues.  -POSTOPERATIVE STATUS - HERNIA REPAIR: You recently had hernia repair surgery and are currently experiencing some soreness but no significant pain. This is a normal part of the healing process. Continue to monitor your recovery and let us  know if you experience any unusual symptoms or increased pain.  INSTRUCTIONS:  Please take a two-week break from Ofev  before starting the reduced dose. If the diarrhea persists after reducing the dose, message us  for further instructions. Continue to monitor your recovery from hernia surgery and report any unusual symptoms or increased pain.

## 2023-12-29 ENCOUNTER — Institutional Professional Consult (permissible substitution): Admitting: Cardiology

## 2023-12-29 ENCOUNTER — Ambulatory Visit: Payer: Medicare Other

## 2023-12-29 DIAGNOSIS — I442 Atrioventricular block, complete: Secondary | ICD-10-CM | POA: Diagnosis not present

## 2023-12-29 LAB — CUP PACEART REMOTE DEVICE CHECK
Battery Remaining Longevity: 96 mo
Battery Remaining Percentage: 73 %
Battery Voltage: 3.01 V
Brady Statistic AP VP Percent: 1 %
Brady Statistic AP VS Percent: 1 %
Brady Statistic AS VP Percent: 1 %
Brady Statistic AS VS Percent: 99 %
Brady Statistic RA Percent Paced: 1 %
Brady Statistic RV Percent Paced: 1 %
Date Time Interrogation Session: 20250807020015
Implantable Lead Connection Status: 753985
Implantable Lead Connection Status: 753985
Implantable Lead Implant Date: 20211111
Implantable Lead Implant Date: 20211111
Implantable Lead Location: 753859
Implantable Lead Location: 753860
Implantable Pulse Generator Implant Date: 20211111
Lead Channel Impedance Value: 440 Ohm
Lead Channel Impedance Value: 510 Ohm
Lead Channel Pacing Threshold Amplitude: 0.75 V
Lead Channel Pacing Threshold Amplitude: 1 V
Lead Channel Pacing Threshold Pulse Width: 0.4 ms
Lead Channel Pacing Threshold Pulse Width: 0.4 ms
Lead Channel Sensing Intrinsic Amplitude: 0.5 mV
Lead Channel Sensing Intrinsic Amplitude: 9 mV
Lead Channel Setting Pacing Amplitude: 2 V
Lead Channel Setting Pacing Amplitude: 2.5 V
Lead Channel Setting Pacing Pulse Width: 0.4 ms
Lead Channel Setting Sensing Sensitivity: 2 mV
Pulse Gen Model: 2272
Pulse Gen Serial Number: 3859811

## 2023-12-30 ENCOUNTER — Ambulatory Visit: Payer: Self-pay | Admitting: Cardiology

## 2024-01-03 ENCOUNTER — Other Ambulatory Visit: Payer: Self-pay | Admitting: Internal Medicine

## 2024-01-09 ENCOUNTER — Encounter: Payer: Self-pay | Admitting: Family Medicine

## 2024-01-09 ENCOUNTER — Ambulatory Visit (INDEPENDENT_AMBULATORY_CARE_PROVIDER_SITE_OTHER): Admitting: Family Medicine

## 2024-01-09 ENCOUNTER — Ambulatory Visit: Payer: Self-pay

## 2024-01-09 VITALS — BP 134/78 | HR 89 | Temp 98.0°F | Resp 16 | Ht 73.0 in | Wt 187.4 lb

## 2024-01-09 DIAGNOSIS — R040 Epistaxis: Secondary | ICD-10-CM

## 2024-01-09 NOTE — Progress Notes (Signed)
 Chief Complaint  Patient presents with   Epistaxis    Nose bleeds    Subjective: Patient is a 83 y.o. male here for nosebleeds.  Hx of these. Usually gets in winter time. Was watching TV 1.5 weeks ago and it started. Hx of cauterization that helped things. No trauma. Has happened 2x since then. No INCS use.   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   Internal hemorrhoids    Premature atrial contractions    Right groin hernia    Second degree AV block    Pacemaker implanted   Tubular adenoma of colon     Objective: BP 134/78 (BP Location: Left Arm, Cuff Size: Normal)   Pulse 89   Temp 98 F (36.7 C) (Oral)   Resp 16   Ht 6' 1 (1.854 m)   Wt 187 lb 6.4 oz (85 kg)   SpO2 98%   BMI 24.72 kg/m  General: Awake, appears stated age Nose: Left nare unremarkable, right nare patent with scant rhinorrhea; excoriated area noted over anterior nasal septum mucosa without active bleeding Lungs: No accessory muscle use Psych: Age appropriate judgment and insight, normal affect and mood  Assessment and Plan: Epistaxis - Plan: Ambulatory referral to ENT  Refer ENT to discuss cauterization. Avoid digital trauma (picking your nose). Use an air humidifier at night at least. Use triple antibiotic ointment like Neosporin twice daily. Use Vaseline/Ayr Gel/NasoGel in between. If you have a nosebleed, apply direct pressure for 10 minutes. If a bleed lasts longer than 15-20 minutes, go to the ER. Consider use of Afrin spray or ice during a nosebleed to help. The patient voiced understanding and agreement to the plan.  Mabel [place], DO 01/09/24  12:02 PM

## 2024-01-09 NOTE — Telephone Encounter (Signed)
  FYI Only or Action Required?: FYI only for provider.  Patient was last seen in primary care on 12/07/2023 by Amon Aloysius BRAVO, MD.  Called Nurse Triage reporting Epistaxis.  Symptoms began several weeks ago.  Interventions attempted: Nothing.  Symptoms are: unchanged.  Triage Disposition: See PCP Within 2 Weeks  Patient/caregiver understands and will follow disposition?: Yes           Copied from CRM [id number]. Topic: Clinical - Red Word Triage >> Jan 09, 2024  8:19 AM Burnard DEL wrote: Red Word that prompted transfer to Nurse Triage: nose bleeds x 1 week and a half Reason for Disposition  Hard-to-stop nosebleeds are a chronic symptom (recurrent or ongoing AND present > 4 weeks)  Answer Assessment - Initial Assessment Questions 1. AMOUNT OF BLEEDING: How bad is the bleeding? How much blood was lost? Has the bleeding stopped?     Bleeding has stopped 2. ONSET: When did the nosebleed start?      A week and a half ago, states just started bleeding,  had another episode this am 3. FREQUENCY: How many nosebleeds have you had in the last 24 hours?      1 4. RECURRENT SYMPTOMS: Have there been other recent nosebleeds? If Yes, ask: How long did it take you to stop the bleeding? What worked best?      Yes, 5-10 minutes 5. CAUSE: What do you think caused this nosebleed?     unknown 6. LOCAL FACTORS: Do you have any cold symptoms?, Have you been rubbing or picking at your nose?     denies 7. SYSTEMIC FACTORS: Do you have high blood pressure or any bleeding problems?     HTN 8. BLOOD THINNERS: Do you take any blood thinners? (e.g., aspirin, clopidogrel / Plavix, coumadin, heparin ). Notes: Other strong blood thinners include: Arixtra (fondaparinux), Eliquis (apixaban), Pradaxa (dabigatran), and Xarelto (rivaroxaban).     denies 9. OTHER SYMPTOMS: Do you have any other symptoms? (e.g., lightheadedness)     denies 10. PREGNANCY: Is there any chance you are  pregnant? When was your last menstrual period?       na  Protocols used: Nosebleed-A-AH

## 2024-01-09 NOTE — Patient Instructions (Signed)
 Avoid digital trauma (picking your nose).  Use an air humidifier at night at least.   Use triple antibiotic ointment like Neosporin twice daily. Use Vaseline/Ayr Gel/NasoGel in between.  If you have a nosebleed, apply direct pressure for 10 minutes. If a bleed lasts longer than 15-20 minutes, go to the ER.  Consider use of Afrin spray or ice during a nosebleed to help.  If you do not hear anything about your referral in the next 1-2 weeks, call our office and ask for an update.  Let us  know if you need anything.

## 2024-01-13 ENCOUNTER — Telehealth: Payer: Self-pay

## 2024-01-13 NOTE — Telephone Encounter (Signed)
 PA renewal initiated automatically by CoverMyMeds.  Submitted a Prior Authorization request to [employer] for OFEV  via CoverMyMeds. Will update once we receive a response.   Key: [id number]

## 2024-01-13 NOTE — Telephone Encounter (Signed)
 Received notification from [employer] regarding a prior authorization for OFEV . Authorization has been APPROVED from 12/14/2023 to 01/12/2025.   Authorization # [id number]  Sherry Pennant, PharmD, MPH, BCPS, CPP Clinical Pharmacist (Rheumatology and Pulmonology)

## 2024-01-25 ENCOUNTER — Institutional Professional Consult (permissible substitution) (INDEPENDENT_AMBULATORY_CARE_PROVIDER_SITE_OTHER)

## 2024-01-31 ENCOUNTER — Ambulatory Visit (INDEPENDENT_AMBULATORY_CARE_PROVIDER_SITE_OTHER)

## 2024-01-31 ENCOUNTER — Encounter (INDEPENDENT_AMBULATORY_CARE_PROVIDER_SITE_OTHER): Payer: Self-pay

## 2024-01-31 VITALS — BP 157/82 | HR 65 | Ht 72.0 in | Wt 186.0 lb

## 2024-01-31 DIAGNOSIS — D692 Other nonthrombocytopenic purpura: Secondary | ICD-10-CM | POA: Diagnosis not present

## 2024-01-31 DIAGNOSIS — L570 Actinic keratosis: Secondary | ICD-10-CM | POA: Diagnosis not present

## 2024-01-31 DIAGNOSIS — L821 Other seborrheic keratosis: Secondary | ICD-10-CM | POA: Diagnosis not present

## 2024-01-31 DIAGNOSIS — H6123 Impacted cerumen, bilateral: Secondary | ICD-10-CM | POA: Diagnosis not present

## 2024-01-31 DIAGNOSIS — R040 Epistaxis: Secondary | ICD-10-CM | POA: Diagnosis not present

## 2024-01-31 DIAGNOSIS — D225 Melanocytic nevi of trunk: Secondary | ICD-10-CM | POA: Diagnosis not present

## 2024-01-31 DIAGNOSIS — Z85828 Personal history of other malignant neoplasm of skin: Secondary | ICD-10-CM | POA: Diagnosis not present

## 2024-01-31 DIAGNOSIS — D485 Neoplasm of uncertain behavior of skin: Secondary | ICD-10-CM | POA: Diagnosis not present

## 2024-01-31 MED ORDER — SALINE SPRAY 0.65 % NA SOLN: 1.0000 | NASAL | 0 refills | Status: AC | PRN

## 2024-01-31 MED ORDER — DEBROX 6.5 % OT SOLN: 5.0000 [drp] | Freq: Two times a day (BID) | OTIC | 0 refills | Status: AC

## 2024-01-31 NOTE — Progress Notes (Signed)
 Dear Dr. Frann, Here is my assessment for our mutual patient, Chase Martinez. Thank you for allowing me the opportunity to care for your patient. Please do not hesitate to contact me should you have any other questions. Sincerely, Dr. Penne Croak  Otolaryngology Clinic Note Referring provider: Dr. Frann HPI:  Chase Martinez is a 83 y.o. male kindly referred by Dr. Frann for evaluation of nose bleeds. Had a nose bleed 3 weeks ago. Symptoms resolved with nasal saline and neomycin ointment BID. No nasal congestion or trauma. No anosmia or changes. Complains of excessive cerumen.   H&N Surgery: none Personal or FHx of bleeding dz or anesthesia difficulty: none. 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   Internal hemorrhoids    Premature atrial contractions    Right groin hernia    Second degree AV block    Pacemaker implanted   Tubular adenoma of colon      Past Surgical History:  Procedure Laterality Date   CATARACT EXTRACTION Bilateral 05/24/2013   EYE SURGERY  Cataracts Removal   INGUINAL HERNIA REPAIR Right 12/13/2023   Procedure: REPAIR, HERNIA, INGUINAL, ADULT;  Surgeon: Signe Mitzie LABOR, MD;  Location: [facility];  Service: General;  Laterality: Right;   MOHS SURGERY     PACEMAKER IMPLANT N/A 04/03/2020   Procedure: PACEMAKER IMPLANT;  Surgeon: Cindie Ole DASEN, MD;  Location: [facility];  Service: Cardiovascular;  Laterality: N/A;   PILONIDAL CYST EXCISION     REFRACTIVE SURGERY     SHOULDER SURGERY     right   bone spur removal , bicep repair   TONSILLECTOMY      Family History  Problem Relation Age of Onset   Hypertension Mother     Glaucoma Mother    Dementia Father    Hearing loss Father    Parkinsonism Sister    Thyroid  cancer Son    Coronary artery disease Neg Hx    Diabetes Neg Hx    Stroke Neg Hx    Prostate cancer Neg Hx    Colon cancer Neg Hx    Esophageal cancer Neg Hx    Stomach cancer Neg Hx      Social Connections: Socially Integrated (11/10/2023)   Social Connection and Isolation Panel    Frequency of Communication with Friends and Family: More than three times a week    Frequency of Social Gatherings with Friends and Family: More than three times a week    Attends Religious Services: More than 4 times per year    Active Member of [employer] or Organizations: Yes    Attends Engineer, structural: More than 4 times per year    Marital Status: Married      Current Outpatient Medications:    carbamide peroxide (DEBROX) 6.5 % OTIC solution, Place 5 drops into the left ear 2 (two) times daily for 4 days., Disp: 15 mL, Rfl: 0   sodium chloride  (OCEAN) 0.65 % SOLN nasal spray, Place 1 spray into both nostrils as needed for congestion., Disp: 88 mL, Rfl: 0   Apoaequorin 10 MG CAPS, Take 10 mg by mouth daily., Disp: ,  Rfl:    Ascorbic Acid (VITAMIN C) 1000 MG tablet, Take 1,000 mg by mouth in the morning., Disp: , Rfl:    atorvastatin (LIPITOR) 20 MG tablet, Take 1 tablet (20 mg total) by mouth daily., Disp: 90 tablet, Rfl: 1   Black Elderberry 1000 MG CAPS, Take 1,000 mg by mouth every evening., Disp: , Rfl:    Cholecalciferol (VITAMIN D3) 50 MCG (2000 UT) capsule, Take 2,000 Units by mouth daily. , Disp: , Rfl:    finasteride (PROSCAR) 5 MG tablet, Take 5 mg by mouth in the morning., Disp: , Rfl:    furosemide  (LASIX ) 20 MG tablet, Take 1 tablet (20 mg total) by mouth daily., Disp: 90 tablet, Rfl: 1   glucose blood (FREESTYLE LITE) test strip, USE TO CHECK BLOOD SUGAR, NOT MORE THAN TWICE A DAY, Disp: 200 strip, Rfl: 12   Lancets (FREESTYLE) lancets, Check blood sugars no more than twice daily,  Disp: 200 each, Rfl: 12   metFORMIN  (GLUCOPHAGE ) 1000 MG tablet, Take 1 tablet (1,000 mg total) by mouth 2 (two) times daily with a meal., Disp: 180 tablet, Rfl: 1   multivitamin (THERAGRAN) per tablet, Take 1 tablet by mouth daily., Disp: , Rfl:    Nintedanib (OFEV ) 100 MG CAPS, Take 1 capsule (100 mg total) by mouth 2 (two) times daily. **dose decrease**, Disp: 180 capsule, Rfl: 1   Omega-3 Fatty Acids (FISH OIL) 1000 MG CAPS, Take 1,000 mg by mouth in the morning., Disp: , Rfl:    pioglitazone  (ACTOS ) 30 MG tablet, Take 1 tablet (30 mg total) by mouth daily., Disp: 90 tablet, Rfl: 1   sitaGLIPtin  (JANUVIA ) 100 MG tablet, Take 1 tablet (100 mg total) by mouth daily., Disp: 90 tablet, Rfl: 1   tamsulosin  (FLOMAX ) 0.4 MG CAPS capsule, Take 1 capsule (0.4 mg total) by mouth in the morning and at bedtime., Disp: , Rfl:    Physical Exam:   BP (!) 157/82 Comment: first attempt 157/82 second attempt 155/75  Pulse 65   Ht 6' (1.829 m)   Wt 186 lb (84.4 kg)   SpO2 98%   BMI 25.23 kg/m   Salient findings:  General: awake, alert, pleasant HEENT: NCAT, clear sclera, no nystagmus, pinna without external lesions, EAC clear, TM demonstrating normal landmarks and aerated middle ear, septum DNS left, prominent vessel right, bilateral inferior turbinates with1+, dentition fair, symmetric tongue and palate movement, no oral cavity lesions, clear posterior oropharynx Neck: trachea midline, no masses/ymphadenopathy/thyromegaly CN II-XII grossly intact No respiratory distress or stridor  Seprately Identifiable Procedures:  I personally ordered, reviewed and interpreted the following with the patient today  Prior to initiating any procedures, risks/benefits/alternatives were explained to the patient. Patient declined nasal endoscopy. Understands risks of missed lesion including cancer.   Procedure: Bilateral ear microscopy and cerumen removal using microscope (CPT [id number]) - Mod 25 Pre-procedure diagnosis:  bilateral Cerumen impaction of external ears Post-procedure diagnosis: same Indication:  cerumen impaction; given patient's otologic complaints and history as well as for improved and comprehensive examination of external ear and tympanic membrane, bilateral otologic examination using microscope was performed and impacted cerumen removed  Procedure: Patient was placed semi-recumbent. Both ear canals were examined using the microscope with findings above. Cerumen removed on left and on right using suction and currette with improvement in EAC examination and patency. Left: EAC was patent. TM was intact . Middle ear was aerated. Drainage:  Right: EAC was patent. TM was intact . Middle ear was aerated . Drainage:  Patient tolerated the procedure well.  Impression & Plans:  Chase Martinez is a 83 y.o. male with bilateral cerumen impaction and hx of epistaxis 3 weeks ago on the right.   1. Epistaxis   2. Bilateral impacted cerumen     Plan - today's findings were discussed with the patient - today's diagnoses were discussed in detail with the patient - Discussed RBA of nasal cautery and moisturizing measures for epistaxis. Patient elected for medical therapy and declined nasal endoscopy. He can also use humidifier at night.  - Rxd debrox for cerumen impaction. Recommend mineral oil for aural maintenance.    - See below regarding exact medications prescribed this encounter including dosages and route: Meds ordered this encounter  Medications   sodium chloride  (OCEAN) 0.65 % SOLN nasal spray    Sig: Place 1 spray into both nostrils as needed for congestion.    Dispense:  88 mL    Refill:  0   carbamide peroxide (DEBROX) 6.5 % OTIC solution    Sig: Place 5 drops into the left ear 2 (two) times daily for 4 days.    Dispense:  15 mL    Refill:  0   - f/u 3 months or sooner if symptoms return.   Thank you for allowing me the opportunity to care for your patient. Please do not hesitate to  contact me should you have any other questions.  Sincerely, Penne Croak, DO Otolaryngologist (ENT) [facility] Phone: [phone] Fax: [phone]  01/31/2024, 3:28 PM   MDM:  Level [phone]

## 2024-02-07 ENCOUNTER — Encounter: Payer: Self-pay | Admitting: Pulmonary Disease

## 2024-02-07 DIAGNOSIS — J849 Interstitial pulmonary disease, unspecified: Secondary | ICD-10-CM

## 2024-02-08 NOTE — Telephone Encounter (Signed)
 Patient requesting to have PFT completed. Please advise, thank you!

## 2024-02-14 ENCOUNTER — Encounter: Payer: Self-pay | Admitting: Internal Medicine

## 2024-02-14 ENCOUNTER — Ambulatory Visit: Payer: Self-pay | Admitting: Internal Medicine

## 2024-02-14 ENCOUNTER — Ambulatory Visit: Admitting: Internal Medicine

## 2024-02-14 ENCOUNTER — Telehealth: Payer: Self-pay

## 2024-02-14 VITALS — BP 138/70 | HR 61 | Temp 98.1°F | Resp 16 | Ht 72.0 in | Wt 191.2 lb

## 2024-02-14 DIAGNOSIS — E1140 Type 2 diabetes mellitus with diabetic neuropathy, unspecified: Secondary | ICD-10-CM

## 2024-02-14 DIAGNOSIS — I10 Essential (primary) hypertension: Secondary | ICD-10-CM | POA: Diagnosis not present

## 2024-02-14 DIAGNOSIS — J61 Pneumoconiosis due to asbestos and other mineral fibers: Secondary | ICD-10-CM

## 2024-02-14 LAB — HEMOGLOBIN A1C: Hgb A1c MFr Bld: 6.5 % (ref 4.6–6.5)

## 2024-02-14 NOTE — Telephone Encounter (Unsigned)
 Copied from CRM #[id number]. Topic: Clinical - Medical Advice >> Feb 14, 2024  9:35 AM Rozanna MATSU wrote: Reason for CRM: pt was returning call but no detail of what the call was for and he stated he will check voicemail. But looks like Alexis in the clinic left him a message

## 2024-02-14 NOTE — Telephone Encounter (Signed)
 Attempted to call patient, no one answered so I left a voicemail.

## 2024-02-14 NOTE — Patient Instructions (Signed)
 It was good to see you today  Try to avoid excessive salt on your diet.  Continue checking your blood pressures regularly Blood pressure goal:  between 110/65 and  135/85. If it is consistently higher or lower, let me know     GO TO THE LAB :  Get the blood work   Your results will be posted on MyChart with my comments  Go to the front desk for the checkout Please make an appointment for a checkup in 4 months

## 2024-02-14 NOTE — Assessment & Plan Note (Signed)
 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  Ambulatory CBGs ranged from 136-130.  On average slightly higher than before.  He is now living alone and diet has not been the best.  Counseled. HTN: On Lasix .  Ambulatory BPs have been on average slightly higher than before, 135/71.  Still very good.  Rec to watch salt intake and continue monitoring. Right hernia repair: Had surgery 12/13/2023, had some complications but now is back to normal Asbestosis: Per last pulmonary note 12/22/2023, Dx is asbestosis.  They decreased Ofev  dose and they are considering switching to pirfenidone. Social: pt's wife have cancer, she is in a palliative care facility.  Patient lives independently. Preventive care: Plans to get a flu and a COVID-vaccine at the pharmacy. RTC 4 months

## 2024-02-14 NOTE — Progress Notes (Signed)
 Subjective:    Patient ID: Chase Martinez, male    DOB: [birth year], 83 y.o.   MRN: [id number]  DOS:  02/14/2024 Type of visit - description: Routine checkup  Chronic medical problems addressed. Good med compliance. Denies chest pain, palpitations.  No lower extremity edema. LUTS controlled. Remains active, plays golf without DOE  Had a R hernia repair 12/13/2023. Went to the ER 2 days later with worsening groin swelling, scrotal US  showed no testicular torsion, moderate bilateral hydrocele.  See full report. Saw surgery 01/05/2024.  Was felt to be recovering well.   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   Internal hemorrhoids    Premature atrial contractions    Right groin hernia    Second degree AV block    Pacemaker implanted   Tubular adenoma of colon     Past Surgical History:  Procedure Laterality Date   CATARACT EXTRACTION Bilateral 05/24/2013   EYE SURGERY  Cataracts Removal   INGUINAL HERNIA REPAIR Right 12/13/2023   Procedure: REPAIR, HERNIA, INGUINAL, ADULT;  Surgeon: Signe Mitzie LABOR, MD;  Location: [facility];  Service: General;  Laterality: Right;   MOHS SURGERY     PACEMAKER IMPLANT N/A 04/03/2020   Procedure: PACEMAKER IMPLANT;  Surgeon: Cindie Ole DASEN, MD;  Location: [facility];  Service: Cardiovascular;  Laterality: N/A;   PILONIDAL CYST EXCISION     REFRACTIVE SURGERY     SHOULDER SURGERY     right   bone spur removal , bicep repair   TONSILLECTOMY      Current Outpatient Medications  Medication Instructions   Apoaequorin 10 mg, Daily   atorvastatin (LIPITOR) 20 mg, Oral, Daily   Black Elderberry 1,000 mg, Every evening   finasteride  (PROSCAR) 5 mg, Every morning   Fish Oil 1,000 mg, Every morning   furosemide  (LASIX ) 20 mg, Oral, Daily   glucose blood (FREESTYLE LITE) test strip USE TO CHECK BLOOD SUGAR, NOT MORE THAN TWICE A DAY   Lancets (FREESTYLE) lancets Check blood sugars no more than twice daily   metFORMIN  (GLUCOPHAGE ) 1,000 mg, Oral, 2 times daily with meals   multivitamin (THERAGRAN) per tablet 1 tablet, Daily   Ofev  100 mg, Oral, 2 times daily, **dose decrease**   pioglitazone  (ACTOS ) 30 mg, Oral, Daily   sitaGLIPtin  (JANUVIA ) 100 mg, Oral, Daily   sodium chloride  (OCEAN) 0.65 % SOLN nasal spray 1 spray, Each Nare, As needed   tamsulosin  (FLOMAX ) 0.4 mg, 2 times daily   vitamin C 1,000 mg, Every morning   Vitamin D3 2,000 Units, Daily       Objective:   Physical Exam BP 138/70   Pulse 61   Temp 98.1 F (36.7 C) (Oral)   Resp 16   Ht 6' (1.829 m)   Wt 191 lb 4 oz (86.8 kg)   SpO2 97%   BMI 25.94 kg/m  General:   Well developed, NAD, BMI noted. HEENT:  Normocephalic . Face symmetric, atraumatic Lungs:  CTA B Normal respiratory effort, no intercostal retractions, no accessory muscle use. Heart: RRR,  no murmur.  Lower extremities: no pretibial edema  bilaterally  Skin: Not pale. Not jaundice Neurologic:  alert & oriented X3.  Speech normal, gait appropriate for age and unassisted Psych--  Cognition and judgment appear intact.  Cooperative with normal attention span and concentration.  Behavior appropriate. No anxious or depressed appearing.      Assessment    Assessment: DM: + neuropathy HTN Hyperlipidemia CV: ---PVCs  -- DR Shlomo HARPER 03-2015, on CCB,BB ---High degree AV Block-pacemaker 04/03/2020 GU: --BPH -- urinary retention, ER visit 09/2022 [facility] Dr. 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  Ambulatory CBGs ranged from 136-130.  On average slightly higher than before.  He is now living alone  and diet has not been the best.  Counseled. HTN: On Lasix .  Ambulatory BPs have been on average slightly higher than before, 135/71.  Still very good.  Rec to watch salt intake and continue monitoring. Right hernia repair: Had surgery 12/13/2023, had some complications but now is back to normal Asbestosis: Per last pulmonary note 12/22/2023, Dx is asbestosis.  They decreased Ofev  dose and they are considering switching to pirfenidone. Social: pt's wife have cancer, she is in a palliative care facility.  Patient lives independently. Preventive care: Plans to get a flu and a COVID-vaccine at the pharmacy. RTC 4 months

## 2024-02-15 NOTE — Telephone Encounter (Signed)
 PT has been scheduled NFN.

## 2024-02-15 NOTE — Telephone Encounter (Signed)
 Patient is now scheduled for PFT and office visit.

## 2024-02-18 NOTE — Progress Notes (Signed)
 Remote PPM Transmission

## 2024-02-25 ENCOUNTER — Encounter: Payer: Self-pay | Admitting: Internal Medicine

## 2024-03-13 ENCOUNTER — Other Ambulatory Visit: Payer: Self-pay

## 2024-03-13 ENCOUNTER — Ambulatory Visit
Admission: EM | Admit: 2024-03-13 | Discharge: 2024-03-13 | Disposition: A | Discharge status: Home / Self Care | Attending: Student | Admitting: Student

## 2024-03-13 ENCOUNTER — Encounter: Payer: Self-pay | Admitting: Emergency Medicine

## 2024-03-13 DIAGNOSIS — L03114 Cellulitis of left upper limb: Secondary | ICD-10-CM | POA: Diagnosis not present

## 2024-03-13 MED ORDER — DOXYCYCLINE HYCLATE 100 MG PO CAPS: 100.0000 mg | ORAL_CAPSULE | Freq: Two times a day (BID) | ORAL | 0 refills | Status: AC

## 2024-03-13 NOTE — ED Triage Notes (Signed)
 Pt here with skin tear to left forearm x 1 week that he feels is worse and could be infected

## 2024-03-13 NOTE — Discharge Instructions (Signed)
-  Doxycycline  twice daily for 7 days.  Make sure to wear sunscreen while spending time outside while on this medication as it can increase your chance of sunburn. You can take this medication with food if you have a sensitive stomach. -Wash your wound with gentle soap and water  2 times daily.  Let air dry or gently pat. You can follow with over-the-counter neosporin ointment (or similar). Keep wrapped during the day or when you're doing something that could get it dirty (working, [employer], cooking, Catering manager). Avoid cleansing with hydrogen peroxide or alcohol!! This damages the tissue. -Seek additional medical attention if the wound is getting worse instead of better- redness increasing in size, pain getting worse, new/worsening discharge, new fevers/chills, etc.

## 2024-03-13 NOTE — ED Provider Notes (Signed)
 [facility]    CSN: [id number] Arrival date & time: 03/13/24  1155      History   Chief Complaint Chief Complaint  Patient presents with   Wound Check    HPI Chase Martinez is a 83 y.o. male presenting with skin tear to left forearm for 1 week.  Medical history diabetes mellitus.  States he tore the skin of his left arm while mowing the lawn about 1 week ago.  He has been cleansing it using hydrogen peroxide, and washes it once daily in the shower.  Initially applied Tegaderm, but this seemed to make the tear worse.  Since then, he has been using Neosporin on the area.  Denies fevers.  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   Internal hemorrhoids    Premature atrial contractions    Right groin hernia    Second degree AV block    Pacemaker implanted   Tubular adenoma of colon     Patient Active Problem List   Diagnosis Date Noted   Asbestosis (HCC) 11/29/2019   Glaucoma 12/21/2018   PCP NOTES >>>>>>>>>>>>>>>>>>>>>> 01/08/2016   Premature atrial contractions    Hernia of abdominal wall 02/22/2012   Annual physical exam 06/21/2011   BPH (benign prostatic hyperplasia) 05/03/2011   CARCINOMA, BASAL CELL 09/23/2006   Diabetes mellitus with neuropathy (HCC) 09/23/2006   Hyperlipidemia 09/23/2006   Essential hypertension 09/23/2006    Past Surgical History:  Procedure Laterality Date   CATARACT EXTRACTION Bilateral 05/24/2013   EYE SURGERY  Cataracts Removal   INGUINAL HERNIA REPAIR Right 12/13/2023   Procedure: REPAIR, HERNIA, INGUINAL, ADULT;  Surgeon: Signe Mitzie LABOR, MD;  Location: [facility];  Service: General;  Laterality: Right;   MOHS SURGERY     PACEMAKER IMPLANT N/A  04/03/2020   Procedure: PACEMAKER IMPLANT;  Surgeon: Cindie Ole DASEN, MD;  Location: [facility];  Service: Cardiovascular;  Laterality: N/A;   PILONIDAL CYST EXCISION     REFRACTIVE SURGERY     SHOULDER SURGERY     right   bone spur removal , bicep repair   TONSILLECTOMY         Home Medications    Prior to Admission medications   Medication Sig Start Date End Date Taking? Authorizing Provider  doxycycline  (VIBRAMYCIN ) 100 MG capsule Take 1 capsule (100 mg total) by mouth 2 (two) times daily for 7 days. 03/13/24 03/20/24 Yes Arlyss Leita BRAVO, PA-C  Apoaequorin 10 MG CAPS Take 10 mg by mouth daily.    [provider]  Ascorbic Acid (VITAMIN C) 1000 MG tablet Take 1,000 mg by mouth in the morning. 08/14/18   [provider]  atorvastatin (LIPITOR) 20 MG tablet Take 1 tablet (20 mg total) by mouth daily. 12/22/23   Paz, Jose E, MD  Black Elderberry 1000 MG CAPS Take 1,000 mg by mouth every evening.    [provider]  Cholecalciferol (VITAMIN D3) 50 MCG (2000 UT) capsule Take 2,000 Units by mouth daily.  08/14/18   [provider]  finasteride (PROSCAR) 5 MG tablet Take 5 mg by mouth in the morning. 10/26/22   [provider]  furosemide  (LASIX ) 20 MG tablet Take 1 tablet (20 mg total) by mouth daily. 10/11/23   Amon Aloysius BRAVO, MD  glucose blood (FREESTYLE LITE) test strip USE TO CHECK BLOOD SUGAR, NOT MORE THAN TWICE A DAY 04/25/23   Amon Aloysius BRAVO, MD  Lancets (FREESTYLE) lancets Check blood sugars no more than twice daily 04/25/23   Paz, Jose E, MD  metFORMIN  (GLUCOPHAGE ) 1000 MG tablet Take 1 tablet (1,000 mg total) by mouth 2 (two) times daily with a meal. 01/03/24   Paz, Jose E, MD  multivitamin [facility]) per tablet Take 1 tablet by mouth daily.    [provider]  Nintedanib (OFEV ) 100 MG CAPS Take 1 capsule (100 mg total) by mouth 2 (two) times daily. **dose decrease** 12/22/23   Mannam, Praveen, MD  Omega-3 Fatty Acids (FISH OIL)  1000 MG CAPS Take 1,000 mg by mouth in the morning.    [provider]  pioglitazone  (ACTOS ) 30 MG tablet Take 1 tablet (30 mg total) by mouth daily. 12/22/23   Amon Aloysius BRAVO, MD  sitaGLIPtin  (JANUVIA ) 100 MG tablet Take 1 tablet (100 mg total) by mouth daily. 11/09/23   Paz, Jose E, MD  sodium chloride  (OCEAN) 0.65 % SOLN nasal spray Place 1 spray into both nostrils as needed for congestion. 01/31/24   Anice Riis, DO  tamsulosin  (FLOMAX ) 0.4 MG CAPS capsule Take 1 capsule (0.4 mg total) by mouth in the morning and at bedtime. 12/13/22   Amon Aloysius BRAVO, MD    Family History Family History  Problem Relation Age of Onset   Hypertension Mother    Glaucoma Mother    Dementia Father    Hearing loss Father    Parkinsonism Sister    Thyroid  cancer Son    Coronary artery disease Neg Hx    Diabetes Neg Hx    Stroke Neg Hx    Prostate cancer Neg Hx    Colon cancer Neg Hx    Esophageal cancer Neg Hx    Stomach cancer Neg Hx     Social History Social History   Tobacco Use   Smoking status: Former    Current packs/day: 0.00    Average packs/day: 0.5 packs/day for 10.0 years (5.0 ttl pk-yrs)    Types: Cigarettes, Cigars    Start date: 05/24/1956    Quit date: 05/24/1966    Years since quitting: 57.8   Smokeless tobacco: Former    Types: Snuff    Quit date: 05/24/1988  Vaping Use   Vaping status: Never Used  Substance Use Topics   Alcohol use: Yes    Alcohol/week: 7.0 standard drinks of alcohol    Types: 7 Glasses of wine per week    Comment: Glass of wine at dinner & minimal social drinking   Drug use: Never     Allergies   Patient has no known allergies.   Review of Systems Review of Systems  Skin:  Positive for wound.     Physical Exam Triage Vital Signs ED Triage Vitals [03/13/24 1252]  Encounter Vitals Group     BP (!) 166/79     Girls Systolic BP Percentile      Girls Diastolic BP Percentile      Boys Systolic BP Percentile      Boys Diastolic BP Percentile       Pulse Rate 77     Resp 18     Temp 98.2 F (36.8 C)     Temp Source Oral  SpO2 96 %     Weight      Height      Head Circumference      Peak Flow      Pain Score 0     Pain Loc      Pain Education      Exclude from Growth Chart    No data found.  Updated Vital Signs BP (!) 166/79 (BP Location: Left Arm)   Pulse 77   Temp 98.2 F (36.8 C) (Oral)   Resp 18   SpO2 96%   Visual Acuity Right Eye Distance:   Left Eye Distance:   Bilateral Distance:    Right Eye Near:   Left Eye Near:    Bilateral Near:     Physical Exam Vitals reviewed.  Constitutional:      General: He is not in acute distress.    Appearance: Normal appearance. He is not ill-appearing.  HENT:     Head: Normocephalic and atraumatic.  Pulmonary:     Effort: Pulmonary effort is normal.  Skin:    Comments: See image below Left forearm with 5 cm skin tear, with scant bleeding.  Surrounding skin is mildly indurated and warm.  There is no purulent drainage.  Neurological:     General: No focal deficit present.     Mental Status: He is alert and oriented to person, place, and time.  Psychiatric:        Mood and Affect: Mood normal.        Behavior: Behavior normal.        Thought Content: Thought content normal.        Judgment: Judgment normal.       UC Treatments / Results  Labs (all labs ordered are listed, but only abnormal results are displayed) Labs Reviewed - No data to display  EKG   Radiology No results found.  Procedures Procedures (including critical care time)  Medications Ordered in UC Medications - No data to display  Initial Impression / Assessment and Plan / UC Course  I have reviewed the triage vital signs and the nursing notes.  Pertinent labs & imaging results that were available during my care of the patient were reviewed by me and considered in my medical decision making (see chart for details).     Patient is a pleasant 83 year old male presenting  with infection of skin tear of left forearm.  He is afebrile and nontachycardic.  Doxycycline  sent.  Wound care as below.  Stop using hydrogen peroxide on the wound.  Return precautions as below.  Final Clinical Impressions(s) / UC Diagnoses   Final diagnoses:  Cellulitis of left forearm     Discharge Instructions      -Doxycycline  twice daily for 7 days.  Make sure to wear sunscreen while spending time outside while on this medication as it can increase your chance of sunburn. You can take this medication with food if you have a sensitive stomach. -Wash your wound with gentle soap and water  2 times daily.  Let air dry or gently pat. You can follow with over-the-counter neosporin ointment (or similar). Keep wrapped during the day or when you're doing something that could get it dirty (working, [employer], cooking, Catering manager). Avoid cleansing with hydrogen peroxide or alcohol!! This damages the tissue. -Seek additional medical attention if the wound is getting worse instead of better- redness increasing in size, pain getting worse, new/worsening discharge, new fevers/chills, etc.    ED Prescriptions  Medication Sig Dispense Auth. Provider   doxycycline  (VIBRAMYCIN ) 100 MG capsule Take 1 capsule (100 mg total) by mouth 2 (two) times daily for 7 days. 14 capsule Shironda Kain E, PA-C      PDMP not reviewed this encounter.   Arlyss Leita BRAVO, PA-C 03/13/24 1329

## 2024-03-22 ENCOUNTER — Ambulatory Visit: Admitting: Primary Care

## 2024-03-22 DIAGNOSIS — N401 Enlarged prostate with lower urinary tract symptoms: Secondary | ICD-10-CM | POA: Diagnosis not present

## 2024-03-22 DIAGNOSIS — R3914 Feeling of incomplete bladder emptying: Secondary | ICD-10-CM | POA: Diagnosis not present

## 2024-03-28 ENCOUNTER — Ambulatory Visit: Admitting: Pulmonary Disease

## 2024-03-29 ENCOUNTER — Ambulatory Visit: Payer: Medicare Other

## 2024-03-29 DIAGNOSIS — M2022 Hallux rigidus, left foot: Secondary | ICD-10-CM | POA: Diagnosis not present

## 2024-03-29 DIAGNOSIS — M2041 Other hammer toe(s) (acquired), right foot: Secondary | ICD-10-CM | POA: Diagnosis not present

## 2024-03-29 DIAGNOSIS — E1151 Type 2 diabetes mellitus with diabetic peripheral angiopathy without gangrene: Secondary | ICD-10-CM | POA: Diagnosis not present

## 2024-03-29 DIAGNOSIS — L851 Acquired keratosis [keratoderma] palmaris et plantaris: Secondary | ICD-10-CM | POA: Diagnosis not present

## 2024-03-29 DIAGNOSIS — L84 Corns and callosities: Secondary | ICD-10-CM | POA: Diagnosis not present

## 2024-03-29 DIAGNOSIS — I442 Atrioventricular block, complete: Secondary | ICD-10-CM | POA: Diagnosis not present

## 2024-03-29 DIAGNOSIS — M2021 Hallux rigidus, right foot: Secondary | ICD-10-CM | POA: Diagnosis not present

## 2024-03-29 DIAGNOSIS — I739 Peripheral vascular disease, unspecified: Secondary | ICD-10-CM | POA: Diagnosis not present

## 2024-03-29 DIAGNOSIS — L603 Nail dystrophy: Secondary | ICD-10-CM | POA: Diagnosis not present

## 2024-03-29 DIAGNOSIS — B351 Tinea unguium: Secondary | ICD-10-CM | POA: Diagnosis not present

## 2024-03-29 LAB — CUP PACEART REMOTE DEVICE CHECK
Battery Remaining Longevity: 94 mo
Battery Remaining Percentage: 71 %
Battery Voltage: 3.01 V
Brady Statistic AP VP Percent: 1 %
Brady Statistic AP VS Percent: 1 %
Brady Statistic AS VP Percent: 1 %
Brady Statistic AS VS Percent: 99 %
Brady Statistic RA Percent Paced: 1 %
Brady Statistic RV Percent Paced: 1 %
Date Time Interrogation Session: 20251106020013
Implantable Lead Connection Status: 753985
Implantable Lead Connection Status: 753985
Implantable Lead Implant Date: 20211111
Implantable Lead Implant Date: 20211111
Implantable Lead Location: 753859
Implantable Lead Location: 753860
Implantable Pulse Generator Implant Date: 20211111
Lead Channel Impedance Value: 460 Ohm
Lead Channel Impedance Value: 530 Ohm
Lead Channel Pacing Threshold Amplitude: 0.75 V
Lead Channel Pacing Threshold Amplitude: 1 V
Lead Channel Pacing Threshold Pulse Width: 0.4 ms
Lead Channel Pacing Threshold Pulse Width: 0.4 ms
Lead Channel Sensing Intrinsic Amplitude: 1.5 mV
Lead Channel Sensing Intrinsic Amplitude: 10 mV
Lead Channel Setting Pacing Amplitude: 2 V
Lead Channel Setting Pacing Amplitude: 2.5 V
Lead Channel Setting Pacing Pulse Width: 0.4 ms
Lead Channel Setting Sensing Sensitivity: 2 mV
Pulse Gen Model: 2272
Pulse Gen Serial Number: 3859811

## 2024-03-30 NOTE — Progress Notes (Signed)
 Remote PPM Transmission

## 2024-04-02 ENCOUNTER — Ambulatory Visit: Payer: Self-pay | Admitting: Cardiology

## 2024-04-04 ENCOUNTER — Ambulatory Visit (INDEPENDENT_AMBULATORY_CARE_PROVIDER_SITE_OTHER): Admitting: Pulmonary Disease

## 2024-04-04 ENCOUNTER — Encounter: Payer: Self-pay | Admitting: Pulmonary Disease

## 2024-04-04 ENCOUNTER — Ambulatory Visit (INDEPENDENT_AMBULATORY_CARE_PROVIDER_SITE_OTHER): Admitting: *Deleted

## 2024-04-04 VITALS — BP 132/66 | HR 80 | Temp 97.8°F | Ht 72.0 in | Wt 196.0 lb

## 2024-04-04 DIAGNOSIS — Z77090 Contact with and (suspected) exposure to asbestos: Secondary | ICD-10-CM

## 2024-04-04 DIAGNOSIS — J849 Interstitial pulmonary disease, unspecified: Secondary | ICD-10-CM

## 2024-04-04 DIAGNOSIS — J8410 Pulmonary fibrosis, unspecified: Secondary | ICD-10-CM | POA: Diagnosis not present

## 2024-04-04 DIAGNOSIS — Z5181 Encounter for therapeutic drug level monitoring: Secondary | ICD-10-CM

## 2024-04-04 LAB — PULMONARY FUNCTION TEST
DL/VA % pred: 92 %
DL/VA: 3.5 ml/min/mmHg/L
DLCO cor % pred: 98 %
DLCO cor: 24.76 ml/min/mmHg
DLCO unc % pred: 98 %
DLCO unc: 24.76 ml/min/mmHg
FEF 25-75 Post: 3.25 L/s
FEF 25-75 Pre: 3.05 L/s
FEF2575-%Change-Post: 6 %
FEF2575-%Pred-Post: 169 %
FEF2575-%Pred-Pre: 159 %
FEV1-%Change-Post: 2 %
FEV1-%Pred-Post: 117 %
FEV1-%Pred-Pre: 115 %
FEV1-Post: 3.44 L
FEV1-Pre: 3.36 L
FEV1FVC-%Change-Post: 3 %
FEV1FVC-%Pred-Pre: 109 %
FEV6-%Change-Post: 0 %
FEV6-%Pred-Post: 109 %
FEV6-%Pred-Pre: 110 %
FEV6-Post: 4.25 L
FEV6-Pre: 4.28 L
FEV6FVC-%Change-Post: 0 %
FEV6FVC-%Pred-Post: 105 %
FEV6FVC-%Pred-Pre: 106 %
FVC-%Change-Post: -1 %
FVC-%Pred-Post: 103 %
FVC-%Pred-Pre: 104 %
FVC-Post: 4.31 L
FVC-Pre: 4.35 L
Post FEV1/FVC ratio: 80 %
Post FEV6/FVC ratio: 99 %
Pre FEV1/FVC ratio: 77 %
Pre FEV6/FVC Ratio: 99 %
RV % pred: 104 %
RV: 2.99 L
TLC % pred: 99 %
TLC: 7.44 L

## 2024-04-04 NOTE — Progress Notes (Signed)
 Full PFT performed today.

## 2024-04-04 NOTE — Progress Notes (Signed)
 Chase Martinez    [id number]    1941/03/18  Primary Care Physician:Paz, Aloysius BRAVO, MD  Referring Physician: Dmani Mizer, MD [address] [place],  [place] [address]  Chief complaint:  Follow-up for asbestosis, progressive pulmonary fibrosis Started nintedanib in September 2024  HPI:  83 y.o. with history of asbestos exposure, hypertension, hyperlipidemia, diabetes.  He is here for evaluation of asbestos-related lung disease.  Reports significant asbestos exposure while working in [employer] Denies any pulmonary symptoms.  No dyspnea, cough, sputum production, wheezing.  Remains active with golf  Started nintedanib in September 2024 for pulmonary fibrosis.  Dose reduced to 100 mg twice daily on July 2025 due to symptoms of diarrhea   Interim history: The patient, with pulmonary fibrosis, presents with issues related to medication side effects and recent hernia repair.  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; recognized by the [facility] - Experiencing diarrhea attributed to nintedanib; initially managed side effects but has had increased difficulty since mid-June 2025 due to personal stressors - Nintedanib taken inconsistently since mid-June 2025 due to side effects and personal conflicts - No current breathing problems - Able to walk eighteen holes of golf when playing - Has not played golf since December 10, 2023  Relevant pulmonary history: Pets: No pets 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  No mold, hot tub, Jacuzzi. Smoking history: 20-pack-year smoking history.  Quit in 1968 Travel history: Lived in [place] , [place] , [place] .  Recent travel to [place] . Relevant family history: No significant family history of lung  issues.  Outpatient Encounter Medications as of 04/04/2024  Medication Sig   Apoaequorin 10 MG CAPS Take 10 mg by mouth daily.   Ascorbic Acid (VITAMIN C) 1000 MG tablet Take 1,000 mg by mouth in the morning.   atorvastatin (LIPITOR) 20 MG tablet Take 1 tablet (20 mg total) by mouth daily.   Black Elderberry 1000 MG CAPS Take 1,000 mg by mouth every evening.   Cholecalciferol (VITAMIN D3) 50 MCG (2000 UT) capsule Take 2,000 Units by mouth daily.    finasteride (PROSCAR) 5 MG tablet Take 5 mg by mouth in the morning.   furosemide  (LASIX ) 20 MG tablet Take 1 tablet (20 mg total) by mouth daily.   glucose blood (FREESTYLE LITE) test strip USE TO CHECK BLOOD SUGAR, NOT MORE THAN TWICE A DAY   Lancets (FREESTYLE) lancets Check blood sugars no more than twice daily   metFORMIN  (GLUCOPHAGE ) 1000 MG tablet Take 1 tablet (1,000 mg total) by mouth 2 (two) times daily with a meal.   multivitamin (THERAGRAN) per tablet Take 1 tablet by mouth daily.   Nintedanib (OFEV ) 100 MG CAPS Take 1 capsule (100 mg total) by mouth 2 (two) times daily. **dose decrease**   Omega-3 Fatty Acids (FISH OIL) 1000 MG CAPS Take 1,000 mg by mouth in the morning.   pioglitazone  (ACTOS ) 30 MG tablet Take 1 tablet (30 mg total) by mouth daily.   sitaGLIPtin  (JANUVIA ) 100 MG tablet Take 1 tablet (100 mg total) by mouth daily.   sodium chloride  (OCEAN) 0.65 % SOLN nasal spray Place 1 spray into both nostrils as needed for congestion.   tamsulosin  (FLOMAX ) 0.4 MG CAPS capsule Take 1 capsule (0.4 mg total) by mouth in the morning  and at bedtime.   No facility-administered encounter medications on file as of 04/04/2024.   Vitals:   04/04/24 1347  BP: 132/66  Pulse: 80  Temp: 97.8 F (36.6 C)  Height: 6' (1.829 m)  Weight: 196 lb (88.9 kg)  SpO2: 97%  TempSrc: Oral  BMI (Calculated): 26.58     Physical Exam GEN: No acute distress CV: Regular rate and rhythm no murmurs LUNGS: Clear to auscultation bilaterally normal  respiratory effort SKIN JOINTS: Warm and dry no rash   Data Reviewed: Imaging: CT chest 07/10/2009- dependent bibasilar atelectasis.  High-resolution CT 10/20/17- minimal septal thickening, subpleural reticulation at the bases.  Mild air trapping.  Tiny calcified granulomas, small pleural plaques Hepatic steatosis, coronary atherosclerosis.  High-resolution CT 12/01/2021-mild subpleural reticulation and traction bronchiectasis.  Indeterminate for UIP.  Stable compared to 2019.  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 I reviewed the images personally.  PFTs  11/17/2017 FVC 4.21 [92%), FEV1 3.43 [104%), F/F 81, TLC 91%, DLCO 75%, DLCO/VA 90% Minimal diffusion defect  12/16/2020 FVC 4.41 [98%], FEV1 3.68 [116%], F/F 83, TLC 7.56 [98%], DLCO 36.15 [136%] Increased diffusion capacity.  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  His condition is more likely than not the result of his exposure during [employer] service  He does have enlarged PA but no symptoms of pulmonary hypertension or cor pulmonale PFTs in 2022 actually show an improvement in lung capacity and diffusion He is asymptomatic and maintaining an active lifestyle and we will continue monitoring him  CT shows slight interval progression of his pulmonary fibrosis though PFTs are normal.  Given change in CT scan we discussed antifibrotic therapy and have decided to initiate Ofev  in Sept 2024.  Pulmonary fibrosis is  well-managed with nintedanib, despite a temporary discontinuation due to personal circumstances. Lung function tests remain stable [lung volumes are improved though diffusion capacity is slightly worse]. No significant changes in symptoms, and he maintains an active lifestyle without dyspnea.  Nintedanib is effective in stabilizing the condition even at a reduced dose.  - Continue Ofev  at current dose. - Maintain active lifestyle. - Scheduled follow-up in six months.   Health maintenance 03/02/2017-influenza 10/30/2013-Prevnar 08/05/2016-Pneumovax  I personally spent a total of 33 minutes in the care of the patient today including preparing to see the patient, getting/reviewing separately obtained history, performing a medically appropriate exam/evaluation, placing orders, referring and communicating with other health care professionals, independently interpreting results, and communicating results.   Plan/Recommendations: Resume Ofev .  100 mg twice daily Continue to follow hepatic panel  Lonna Coder MD [facility] 04/04/2024, 1:53 PM  CC: Amel Gianino, MD

## 2024-04-04 NOTE — Patient Instructions (Signed)
 Full PFT performed today.

## 2024-04-04 NOTE — Patient Instructions (Signed)
  VISIT SUMMARY: You had a follow-up appointment to check on your pulmonary fibrosis. Your lung function tests are stable, and you are not experiencing any shortness of breath, even during activities like walking while playing golf.  YOUR PLAN: PULMONARY FIBROSIS DUE TO ASBESTOSIS: Your pulmonary fibrosis is stable, and your lung function tests are within normal limits. You are not experiencing any significant symptoms and can maintain an active lifestyle. -Continue taking Ofev  at the current dose. -Maintain your active lifestyle. -Schedule a follow-up appointment in six months.

## 2024-04-05 DIAGNOSIS — H401231 Low-tension glaucoma, bilateral, mild stage: Secondary | ICD-10-CM | POA: Diagnosis not present

## 2024-04-05 DIAGNOSIS — H04123 Dry eye syndrome of bilateral lacrimal glands: Secondary | ICD-10-CM | POA: Diagnosis not present

## 2024-04-09 ENCOUNTER — Other Ambulatory Visit: Payer: Self-pay | Admitting: Internal Medicine

## 2024-05-01 ENCOUNTER — Encounter (INDEPENDENT_AMBULATORY_CARE_PROVIDER_SITE_OTHER): Payer: Self-pay

## 2024-05-01 ENCOUNTER — Ambulatory Visit (INDEPENDENT_AMBULATORY_CARE_PROVIDER_SITE_OTHER)

## 2024-05-01 DIAGNOSIS — H6122 Impacted cerumen, left ear: Secondary | ICD-10-CM | POA: Diagnosis not present

## 2024-05-01 DIAGNOSIS — H905 Unspecified sensorineural hearing loss: Secondary | ICD-10-CM | POA: Diagnosis not present

## 2024-05-01 DIAGNOSIS — Z77122 Contact with and (suspected) exposure to noise: Secondary | ICD-10-CM | POA: Diagnosis not present

## 2024-05-01 DIAGNOSIS — R040 Epistaxis: Secondary | ICD-10-CM | POA: Diagnosis not present

## 2024-05-01 DIAGNOSIS — H9319 Tinnitus, unspecified ear: Secondary | ICD-10-CM | POA: Diagnosis not present

## 2024-05-01 NOTE — Patient Instructions (Signed)
 Cerumen Impactions  Earwax, or cerumen,is made by the glands and the skin of the ear canal. If it is made in excess or very dry, a blockage or impaction may result. This is also common with hearing aids or frequent use of earbuds. Do not use cotton swabs a.k.a. Q-tips. Instead try the following: turn your head to one side and gently fill your canal with baby oil or mineral oil, using an eyedropper. Allow the oil to soak in for a minute or two before turning over and placing the oil in the opposite ear. Do this once or twice a day for 3 to 4 days. This will allow the wax to soften. For the next three or four days gently filled the ear canal with 3% hydrogen peroxide in the same manner that you installed the oil. Hydrogen peroxide is available at your pharmacy or market and will usually bubble out the air wax once it has become soft. If you have ventilating tube to your ears, dilute peroxide and half with water; Discontinue if you have any discomfort, dizziness or drainage. For stubborn ear impaction, it may be necessary to continue the oil and peroxide for a few weeks or have it removed by your doctor. People are frequently troubled by wax and actions may want to use the oil and peroxide on a weekly or monthly basis. If you have any further questions, do not hesitate to ask.  ? Do not use Q-tips ? Once a week use a dropper to apply 2 to 3 jobs in mineral oil to both ear canals at bedtime.

## 2024-05-02 NOTE — Progress Notes (Signed)
 Dear Dr. Amon, Here is my assessment for our mutual patient, Chase Martinez. Thank you for allowing me the opportunity to care for your patient. Please do not hesitate to contact me should you have any other questions. Sincerely, Dr. Penne Croak  Otolaryngology Clinic Note Referring provider: Dr. Amon HPI:  Discussed the use of AI scribe software for clinical note transcription with the patient, who gave verbal consent to proceed.  History of Present Illness Chase Martinez is an 83 year old male who presents for ear wax management and hearing evaluation.  Cerumen impaction - Buildup of earwax in the left ear. - Previously managed with ear drops and warm water  using a squeeze ball.  Hearing loss and tinnitus - History of tinnitus with a 10% disability benefit. - Experiences some frequency loss but does not consider it significant enough to require a hearing aid. - Last audiogram performed approximately three years ago.  Pulmonary disease - History of asbestosis and pulmonary fibrosis. - No current breathing problems. 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  Occupational noise exposure - Career in [employer] with exposure to high noise levels, especially in engine rooms. - Used rubber plugs and later earmuffs for hearing protection during service.    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   Internal hemorrhoids    Premature atrial contractions    Right groin hernia    Second degree AV block    Pacemaker implanted   Tubular adenoma of colon      Past Surgical History:  Procedure  Laterality Date   CATARACT EXTRACTION Bilateral 05/24/2013   EYE SURGERY  Cataracts Removal   INGUINAL HERNIA REPAIR Right 12/13/2023   Procedure: REPAIR, HERNIA, INGUINAL, ADULT;  Surgeon: Signe Mitzie LABOR, MD;  Location: [facility];  Service: General;  Laterality: Right;   MOHS SURGERY     PACEMAKER IMPLANT N/A 04/03/2020   Procedure: PACEMAKER IMPLANT;  Surgeon: Cindie Ole DASEN, MD;  Location: [facility];  Service: Cardiovascular;  Laterality: N/A;   PILONIDAL CYST EXCISION     REFRACTIVE SURGERY     SHOULDER SURGERY     right   bone spur removal , bicep repair   TONSILLECTOMY      Family History  Problem Relation Age of Onset   Hypertension Mother    Glaucoma Mother    Dementia Father    Hearing loss Father    Parkinsonism Sister    Thyroid  cancer Son    Coronary artery disease Neg Hx    Diabetes Neg Hx    Stroke Neg Hx    Prostate cancer Neg Hx    Colon cancer Neg Hx    Esophageal cancer Neg Hx    Stomach cancer Neg Hx      Social Connections: Socially Integrated (11/10/2023)   Social Connection and Isolation Panel    Frequency of Communication with Friends and Family: More than three times a week    Frequency of Social Gatherings with Friends and Family: More than three times a week    Attends  Religious Services: More than 4 times per year    Active Member of Clubs or Organizations: Yes    Attends Banker Meetings: More than 4 times per year    Marital Status: Married      Current Outpatient Medications:    Apoaequorin 10 MG CAPS, Take 10 mg by mouth daily., Disp: , Rfl:    Ascorbic Acid (VITAMIN C) 1000 MG tablet, Take 1,000 mg by mouth in the morning., Disp: , Rfl:    atorvastatin (LIPITOR) 20 MG tablet, Take 1 tablet (20 mg total) by mouth daily., Disp: 90 tablet, Rfl: 1   Black Elderberry 1000 MG CAPS, Take 1,000 mg by mouth every evening., Disp: , Rfl:    Cholecalciferol (VITAMIN D3) 50 MCG (2000 UT) capsule, Take 2,000 Units by mouth  daily. , Disp: , Rfl:    finasteride (PROSCAR) 5 MG tablet, Take 5 mg by mouth in the morning., Disp: , Rfl:    furosemide  (LASIX ) 20 MG tablet, Take 1 tablet (20 mg total) by mouth daily., Disp: 90 tablet, Rfl: 1   glucose blood (FREESTYLE LITE) test strip, USE TO CHECK BLOOD SUGAR, NOT MORE THAN TWICE A DAY, Disp: 200 strip, Rfl: 12   Lancets (FREESTYLE) lancets, Check blood sugars no more than twice daily, Disp: 200 each, Rfl: 12   metFORMIN  (GLUCOPHAGE ) 1000 MG tablet, Take 1 tablet (1,000 mg total) by mouth 2 (two) times daily with a meal., Disp: 180 tablet, Rfl: 1   multivitamin (THERAGRAN) per tablet, Take 1 tablet by mouth daily., Disp: , Rfl:    Nintedanib (OFEV ) 100 MG CAPS, Take 1 capsule (100 mg total) by mouth 2 (two) times daily. **dose decrease**, Disp: 180 capsule, Rfl: 1   Omega-3 Fatty Acids (FISH OIL) 1000 MG CAPS, Take 1,000 mg by mouth in the morning., Disp: , Rfl:    pioglitazone  (ACTOS ) 30 MG tablet, Take 1 tablet (30 mg total) by mouth daily., Disp: 90 tablet, Rfl: 1   sitaGLIPtin  (JANUVIA ) 100 MG tablet, Take 1 tablet (100 mg total) by mouth daily., Disp: 90 tablet, Rfl: 1   sodium chloride  (OCEAN) 0.65 % SOLN nasal spray, Place 1 spray into both nostrils as needed for congestion., Disp: 88 mL, Rfl: 0   tamsulosin  (FLOMAX ) 0.4 MG CAPS capsule, Take 1 capsule (0.4 mg total) by mouth in the morning and at bedtime., Disp: , Rfl:    Physical Exam:   BP (!) 148/76 (BP Location: Right Arm, Patient Position: Sitting) Comment: driving here made him nervous  Pulse 69   Temp 97.9 F (36.6 C)   SpO2 95%   The patient was awake, alert, and appropriate. The external ears were inspected, and otoscopy was performed to evaluate the external auditory canals and tympanic membranes. The nasal cavity and septum were examined for mucosal changes, obstruction, or discharge. The oral cavity and oropharynx were inspected for mucosal lesions, infection, or tonsillar hypertrophy. The neck was  palpated for lymphadenopathy, thyroid  abnormalities, or other masses. Cranial nerve function was grossly intact.  Pertinent Findings: Physical Exam HEENT: Atraumatic, normocephalic. Cerumen impaction in left ear. Nose normal.   Seprately Identifiable Procedures:  I personally ordered, reviewed and interpreted the following with the patient today  Procedure: Bilateral ear microscopy using microscope (CPT [phone]) Pre-procedure diagnosis: left ear cerumen Post-procedure diagnosis: same Indication: see above; given patient's otologic complaints and history, for improved and comprehensive examination of external ear and tympanic membrane, bilateral otologic examination using microscope was performed. Prior to proceeding, verbal  consent was obtained after discussion of R/B/A  Procedure: Patient was placed semi-recumbent. Both ear canals were examined using the microscope with findings below. Patient tolerated the procedure well.  Right ear:  No significant lesions pinna. EAC: no significant lesions. Canal is clear. Eczematoid changes. minimal TM: Intact   Left ear:  No significant lesions pinna. EAC: cerumen TM: cerumen impaction   Impression & Plans:  Chase Martinez is a 83 y.o. male  1. Left ear impacted cerumen   2. Epistaxis   3. Hearing loss, unspecified hearing loss type, unspecified laterality   4. Exposed to noise    - Findings and diagnoses discussed in detail with the patient. - Risks, benefits, and alternatives were reviewed. Through shared decision making, the patient elects to proceed with below. 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 No significant impairment requiring hearing aids. - Schedule hearing test in six months. - Consider hearing aids if test indicates significant loss.  Epistaxis  - continue vaseline at night  Cerumen - hydrogen peroxide x  3 days, then oil based solution weekly.  - Orders placed:  Orders Placed This Encounter  Procedures   Ambulatory referral to Audiology   - Medications prescribed/continued/adjusted: No orders of the defined types were placed in this encounter.  - Education materials provided to the patient. - Follow up: 6 months. Patient instructed to return sooner or go to the ED if new/worsening symptoms develop.   Thank you for allowing me the opportunity to care for your patient. Please do not hesitate to contact me should you have any other questions.  Sincerely, Penne Croak, DO Otolaryngologist (ENT) [facility] Phone: [phone] Fax: [phone]  05/02/2024, 4:09 PM

## 2024-05-03 ENCOUNTER — Other Ambulatory Visit: Payer: Self-pay | Admitting: Internal Medicine

## 2024-05-07 ENCOUNTER — Other Ambulatory Visit: Payer: Self-pay | Admitting: Internal Medicine

## 2024-06-19 ENCOUNTER — Other Ambulatory Visit: Payer: Self-pay | Admitting: Internal Medicine

## 2024-06-19 ENCOUNTER — Ambulatory Visit: Admitting: Internal Medicine

## 2024-06-27 ENCOUNTER — Ambulatory Visit: Admitting: Internal Medicine

## 2024-06-27 ENCOUNTER — Encounter: Payer: Self-pay | Admitting: Internal Medicine

## 2024-06-27 VITALS — BP 138/72 | HR 74 | Temp 97.4°F | Resp 16 | Ht 72.0 in | Wt 202.5 lb

## 2024-06-27 DIAGNOSIS — N400 Enlarged prostate without lower urinary tract symptoms: Secondary | ICD-10-CM

## 2024-06-27 DIAGNOSIS — E1140 Type 2 diabetes mellitus with diabetic neuropathy, unspecified: Secondary | ICD-10-CM

## 2024-06-27 DIAGNOSIS — I10 Essential (primary) hypertension: Secondary | ICD-10-CM

## 2024-06-27 DIAGNOSIS — J61 Pneumoconiosis due to asbestos and other mineral fibers: Secondary | ICD-10-CM

## 2024-06-27 NOTE — Progress Notes (Unsigned)
 "  Subjective:    Patient ID: Chase Martinez, male    DOB: 09/28/1940, 84 y.o.   MRN: [id number]  DOS:  06/27/2024 Follow-up  Discussed the use of AI scribe software for clinical note transcription with the patient, who gave verbal consent to proceed.  History of Present Illness   He feels well overall and stays physically active. He checks fasting blood sugars daily with an average of 121 mg/dL. Home blood pressure averages 131/67 mmHg with a heart rate of 66 bpm. He takes Januvia , pioglitazone , and metformin  for diabetes, atorvastatin 20 mg, Lasix , Flomax , and Proscar. He has not seen a urologist in over a year but has no urinary concerns.  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   He denies recent chest pain, significant cough, or sputum production.  He has reduced outdoor activity recently because of weather. He is up to date on flu and COVID vaccinations and believes he had pneumonia in the past. Records indicate prior RSV.  He is a widower after losing his wife in October and his daughter in November. His son lives in [place] and plans to retire soon. He cooks rarely and mostly eats frozen foods.    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   Internal hemorrhoids    Premature atrial contractions    Right groin hernia    Second degree AV block    Pacemaker implanted   Tubular adenoma of colon     Past Surgical History:  Procedure Laterality Date   CATARACT EXTRACTION Bilateral 05/24/2013   EYE SURGERY  Cataracts Removal   INGUINAL HERNIA REPAIR Right 12/13/2023   Procedure: REPAIR, HERNIA,  INGUINAL, ADULT;  Surgeon: Signe Mitzie LABOR, MD;  Location: [facility];  Service: General;  Laterality: Right;   MOHS SURGERY     PACEMAKER IMPLANT N/A 04/03/2020   Procedure: PACEMAKER IMPLANT;  Surgeon: Cindie Ole DASEN, MD;  Location: [facility];  Service: Cardiovascular;  Laterality: N/A;   PILONIDAL CYST EXCISION     REFRACTIVE SURGERY     SHOULDER SURGERY     right   bone spur removal , bicep repair   TONSILLECTOMY      Current Outpatient Medications  Medication Instructions   Apoaequorin 10 mg, Daily   atorvastatin (LIPITOR) 20 mg, Oral, Daily   Black Elderberry 1,000 mg, Every evening   finasteride (PROSCAR) 5 mg, Every morning   Fish Oil 1,000 mg, Every morning   furosemide  (LASIX ) 20 mg, Oral, Daily   glucose blood (FREESTYLE LITE) test strip USE TO CHECK BLOOD SUGAR, NOT MORE THAN TWICE A DAY   Lancets (FREESTYLE) lancets Check blood sugars no more than twice daily   metFORMIN  (GLUCOPHAGE ) 1,000 mg, Oral, 2 times daily with meals   multivitamin (THERAGRAN) per tablet 1 tablet, Daily   Ofev  100 mg, Oral, 2 times daily, **dose decrease**   pioglitazone  (ACTOS ) 30 mg, Oral, Daily   sitaGLIPtin  (JANUVIA ) 100 mg, Oral, Daily   sodium chloride  (OCEAN) 0.65 % SOLN nasal spray 1 spray, Each  Nare, As needed   tamsulosin  (FLOMAX ) 0.4 mg, 2 times daily   vitamin C 1,000 mg, Every morning   Vitamin D3 2,000 Units, Daily       Objective:   Physical Exam BP 138/72   Pulse 74   Temp (!) 97.4 F (36.3 C) (Oral)   Resp 16   Ht 6' (1.829 m)   Wt 202 lb 8 oz (91.9 kg)   SpO2 96%   BMI 27.46 kg/m  General:   Well developed, NAD, BMI noted. HEENT:  Normocephalic . Face symmetric, atraumatic Lungs:  Decreased breath sounds. Normal respiratory effort, no intercostal retractions, no accessory muscle use. Heart: RRR,  no murmur.  Lower extremities: no pretibial edema bilaterally  Skin: Not pale. Not jaundice Neurologic:  alert & oriented X3.  Speech normal, gait  appropriate for age and unassisted Psych--  Cognition and judgment appear intact.  Cooperative with normal attention span and concentration.  Behavior appropriate. No anxious or depressed appearing.      Assessment   Assessment: DM: + neuropathy HTN Hyperlipidemia CV: ---PVCs  -- DR Shlomo HARPER 03-2015, on CCB,BB ---High degree AV Block-pacemaker 04/03/2020 GU: --BPH -- urinary retention, ER visit 09/2022 [facility] Dr. Joshua Nail dystrophy  Asbestosis dx 2019 Shingles 04-2020    Assessment & Plan DM with neuropathy Diabetes is well-controlled.  Average CBGs 121.  He experiences neuropathy symptoms in his hands and feet, with heaviness in his legs during prolonged driving. Continue Januvia , pioglitazone , and metformin . An A1c test is ordered. He is advised to maintain a healthy diet. HTN Average BP 131/67, check a BMP, continue Lasix . 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  Was recommended to resume Ofev   Epistaxis: Saw ENT 01/31/2024, they agreed on conservative treatment  General Health Maintenance   Vaccinations are up to date, and a pacemaker check is scheduled. Social: Last year, lost his wife and daughter, they had prolonged illness, emotionally doing okay.  He is trying to remain active. His son lives nearby. RTC around 5 months   PLAN: DM with neuropathy: On metformin , Januvia , pioglitazone .  Ambulatory CBGs ranged from 136-130.  On average slightly higher than before.  He is now living alone and diet has not been the best.  Counseled. HTN: On Lasix .  Ambulatory BPs have been on average slightly higher than before, 135/71.  Still very good.  Rec to watch salt intake and continue monitoring. Right hernia repair: Had surgery 12/13/2023, had some complications but now is back to normal Asbestosis: Per last pulmonary note  12/22/2023, Dx is asbestosis.  They decreased Ofev  dose and they are considering switching to pirfenidone. Social: pt's wife have cancer, she is in a palliative care facility.  Patient lives independently. Preventive care: Plans to get a flu and a COVID-vaccine at the pharmacy. RTC 4 months     "

## 2024-06-27 NOTE — Patient Instructions (Addendum)
 Please read your instructions carefully.   GO TO THE LAB :  Get the blood work    Go to the front desk for the checkout Please make an appointment for a checkup in around 5 months     Continue checking your blood pressure regularly Blood pressure goal:  between 110/65 and  130/80. If it is consistently higher or lower, let me know  Continue checking your blood sugars - early in AM fasting  ( blood sugar goal 70-130) - 2 hours after a meal (blood sugar goal less than 180)

## 2024-06-28 ENCOUNTER — Ambulatory Visit: Payer: Self-pay | Admitting: Internal Medicine

## 2024-06-28 ENCOUNTER — Ambulatory Visit

## 2024-06-28 LAB — BASIC METABOLIC PANEL WITH GFR
BUN: 24 mg/dL — ABNORMAL HIGH (ref 6–23)
CO2: 28 meq/L (ref 19–32)
Calcium: 9.4 mg/dL (ref 8.4–10.5)
Chloride: 101 meq/L (ref 96–112)
Creatinine, Ser: 1.04 mg/dL (ref 0.40–1.50)
GFR: 65.38 mL/min
Glucose, Bld: 123 mg/dL — ABNORMAL HIGH (ref 70–99)
Potassium: 4.3 meq/L (ref 3.5–5.1)
Sodium: 138 meq/L (ref 135–145)

## 2024-06-28 LAB — MICROALBUMIN / CREATININE URINE RATIO
Creatinine,U: 43.1 mg/dL
Microalb Creat Ratio: UNDETERMINED mg/g (ref 0.0–30.0)
Microalb, Ur: <0.7 mg/dL

## 2024-06-28 LAB — HEMOGLOBIN A1C: Hgb A1c MFr Bld: 6.4 % (ref 4.6–6.5)

## 2024-06-28 NOTE — Assessment & Plan Note (Signed)
 DM with neuropathy Diabetes is well-controlled.  Average CBGs 121.  He experiences neuropathy symptoms in his hands and feet, with heaviness in his legs during prolonged driving. Continue Januvia , pioglitazone , and metformin . An A1c test is ordered. He is advised to maintain a healthy diet. HTN Average BP 131/67, check a BMP, continue Lasix . 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  Was recommended to resume Ofev   Epistaxis: Saw ENT 01/31/2024, they agreed on conservative treatment General Health Maintenance   Vaccinations are up to date, and a pacemaker check is scheduled. Social: Last year, lost his wife and daughter, they had prolonged illness, emotionally doing okay.  He is trying to remain active. His son lives nearby. RTC around 5 months

## 2024-06-29 LAB — CUP PACEART REMOTE DEVICE CHECK
Battery Remaining Longevity: 91 mo
Battery Remaining Percentage: 69 %
Battery Voltage: 3.01 V
Brady Statistic AP VP Percent: 1 %
Brady Statistic AP VS Percent: 1 %
Brady Statistic AS VP Percent: 1 %
Brady Statistic AS VS Percent: 99 %
Brady Statistic RA Percent Paced: 1 %
Brady Statistic RV Percent Paced: 1 %
Date Time Interrogation Session: 20260205020013
Implantable Lead Connection Status: 753985
Implantable Lead Connection Status: 753985
Implantable Lead Implant Date: 20211111
Implantable Lead Implant Date: 20211111
Implantable Lead Location: 753859
Implantable Lead Location: 753860
Implantable Pulse Generator Implant Date: 20211111
Lead Channel Impedance Value: 450 Ohm
Lead Channel Impedance Value: 540 Ohm
Lead Channel Pacing Threshold Amplitude: 0.75 V
Lead Channel Pacing Threshold Amplitude: 1 V
Lead Channel Pacing Threshold Pulse Width: 0.4 ms
Lead Channel Pacing Threshold Pulse Width: 0.4 ms
Lead Channel Sensing Intrinsic Amplitude: 1 mV
Lead Channel Sensing Intrinsic Amplitude: 9.9 mV
Lead Channel Setting Pacing Amplitude: 2 V
Lead Channel Setting Pacing Amplitude: 2.5 V
Lead Channel Setting Pacing Pulse Width: 0.4 ms
Lead Channel Setting Sensing Sensitivity: 2 mV
Pulse Gen Model: 2272
Pulse Gen Serial Number: 3859811

## 2024-09-27 ENCOUNTER — Ambulatory Visit

## 2024-10-30 ENCOUNTER — Ambulatory Visit (INDEPENDENT_AMBULATORY_CARE_PROVIDER_SITE_OTHER)

## 2024-10-30 ENCOUNTER — Ambulatory Visit (INDEPENDENT_AMBULATORY_CARE_PROVIDER_SITE_OTHER): Admitting: Audiology

## 2024-11-22 ENCOUNTER — Ambulatory Visit

## 2024-11-27 ENCOUNTER — Ambulatory Visit: Admitting: Internal Medicine

## 2024-12-27 ENCOUNTER — Ambulatory Visit

## 2025-03-28 ENCOUNTER — Ambulatory Visit
